# Patient Record
Sex: Male | Born: 1965 | Race: White | Hispanic: No | Marital: Married | Smoking: Former smoker
Health system: Southern US, Community
[De-identification: ages and names within clinical notes are randomized; demographics above are authoritative.]

## PROBLEM LIST (undated history)

## (undated) DIAGNOSIS — D473 Essential (hemorrhagic) thrombocythemia: Secondary | ICD-10-CM

## (undated) DIAGNOSIS — I639 Cerebral infarction, unspecified: Secondary | ICD-10-CM

## (undated) DIAGNOSIS — M1990 Unspecified osteoarthritis, unspecified site: Secondary | ICD-10-CM

## (undated) DIAGNOSIS — Z96649 Presence of unspecified artificial hip joint: Secondary | ICD-10-CM

## (undated) DIAGNOSIS — G4733 Obstructive sleep apnea (adult) (pediatric): Secondary | ICD-10-CM

## (undated) DIAGNOSIS — T7840XA Allergy, unspecified, initial encounter: Secondary | ICD-10-CM

## (undated) DIAGNOSIS — B370 Candidal stomatitis: Secondary | ICD-10-CM

## (undated) DIAGNOSIS — M5410 Radiculopathy, site unspecified: Secondary | ICD-10-CM

## (undated) DIAGNOSIS — D75839 Thrombocytosis, unspecified: Secondary | ICD-10-CM

## (undated) DIAGNOSIS — E8881 Metabolic syndrome: Secondary | ICD-10-CM

## (undated) DIAGNOSIS — E291 Testicular hypofunction: Secondary | ICD-10-CM

## (undated) DIAGNOSIS — K2960 Other gastritis without bleeding: Secondary | ICD-10-CM

## (undated) DIAGNOSIS — J449 Chronic obstructive pulmonary disease, unspecified: Secondary | ICD-10-CM

## (undated) DIAGNOSIS — E669 Obesity, unspecified: Secondary | ICD-10-CM

## (undated) DIAGNOSIS — E785 Hyperlipidemia, unspecified: Secondary | ICD-10-CM

## (undated) DIAGNOSIS — Z96659 Presence of unspecified artificial knee joint: Secondary | ICD-10-CM

## (undated) DIAGNOSIS — N400 Enlarged prostate without lower urinary tract symptoms: Secondary | ICD-10-CM

## (undated) DIAGNOSIS — F1910 Other psychoactive substance abuse, uncomplicated: Secondary | ICD-10-CM

## (undated) DIAGNOSIS — Z96642 Presence of left artificial hip joint: Secondary | ICD-10-CM

## (undated) DIAGNOSIS — I10 Essential (primary) hypertension: Secondary | ICD-10-CM

## (undated) DIAGNOSIS — F429 Obsessive-compulsive disorder, unspecified: Secondary | ICD-10-CM

## (undated) HISTORY — DX: Chronic obstructive pulmonary disease, unspecified: J44.9

## (undated) HISTORY — DX: Allergy, unspecified, initial encounter: T78.40XA

## (undated) HISTORY — DX: Presence of unspecified artificial hip joint: Z96.649

## (undated) HISTORY — DX: Essential (primary) hypertension: I10

## (undated) HISTORY — DX: Candidal stomatitis: B37.0

## (undated) HISTORY — DX: Hyperlipidemia, unspecified: E78.5

## (undated) HISTORY — PX: KNEE SURGERY: SHX244

## (undated) HISTORY — PX: JOINT REPLACEMENT: SHX530

## (undated) HISTORY — DX: Thrombocytosis, unspecified: D75.839

## (undated) HISTORY — DX: Testicular hypofunction: E29.1

## (undated) HISTORY — DX: Other psychoactive substance abuse, uncomplicated: F19.10

## (undated) HISTORY — DX: Radiculopathy, site unspecified: M54.10

## (undated) HISTORY — DX: Obsessive-compulsive disorder, unspecified: F42.9

## (undated) HISTORY — DX: Metabolic syndrome: E88.81

## (undated) HISTORY — DX: Obesity, unspecified: E66.9

## (undated) HISTORY — DX: Other gastritis without bleeding: K29.60

## (undated) HISTORY — DX: Essential (hemorrhagic) thrombocythemia: D47.3

## (undated) HISTORY — DX: Benign prostatic hyperplasia without lower urinary tract symptoms: N40.0

## (undated) HISTORY — DX: Metabolic syndrome: E88.810

## (undated) HISTORY — DX: Presence of unspecified artificial knee joint: Z96.659

## (undated) HISTORY — PX: FACIAL RECONSTRUCTION SURGERY: SHX631

## (undated) HISTORY — DX: Presence of left artificial hip joint: Z96.642

## (undated) HISTORY — DX: Obstructive sleep apnea (adult) (pediatric): G47.33

---

## 2004-03-11 ENCOUNTER — Ambulatory Visit: Payer: Self-pay | Admitting: Otolaryngology

## 2005-02-23 ENCOUNTER — Other Ambulatory Visit: Payer: Self-pay

## 2005-02-23 ENCOUNTER — Emergency Department: Payer: Self-pay | Admitting: Emergency Medicine

## 2005-07-20 ENCOUNTER — Emergency Department: Payer: Self-pay | Admitting: Internal Medicine

## 2006-01-26 ENCOUNTER — Ambulatory Visit: Payer: Self-pay | Admitting: Family Medicine

## 2006-03-04 ENCOUNTER — Ambulatory Visit: Payer: Self-pay | Admitting: Family Medicine

## 2006-06-08 ENCOUNTER — Ambulatory Visit: Payer: Self-pay | Admitting: Family Medicine

## 2008-05-03 ENCOUNTER — Ambulatory Visit: Payer: Self-pay | Admitting: Family Medicine

## 2008-09-06 ENCOUNTER — Ambulatory Visit: Payer: Self-pay

## 2008-10-20 ENCOUNTER — Ambulatory Visit: Payer: Self-pay | Admitting: Internal Medicine

## 2009-02-10 ENCOUNTER — Ambulatory Visit: Payer: Self-pay | Admitting: Family Medicine

## 2009-05-04 ENCOUNTER — Ambulatory Visit: Payer: Self-pay | Admitting: Internal Medicine

## 2009-05-14 ENCOUNTER — Inpatient Hospital Stay: Payer: Self-pay | Admitting: Orthopedic Surgery

## 2009-07-22 ENCOUNTER — Ambulatory Visit: Payer: Self-pay | Admitting: Unknown Physician Specialty

## 2009-07-31 DIAGNOSIS — M069 Rheumatoid arthritis, unspecified: Secondary | ICD-10-CM | POA: Insufficient documentation

## 2009-08-05 ENCOUNTER — Ambulatory Visit: Payer: Self-pay | Admitting: Family Medicine

## 2009-08-15 ENCOUNTER — Ambulatory Visit: Payer: Self-pay | Admitting: Unknown Physician Specialty

## 2010-05-04 ENCOUNTER — Ambulatory Visit: Payer: Self-pay | Admitting: Urology

## 2010-06-08 ENCOUNTER — Ambulatory Visit: Payer: Self-pay | Admitting: Family Medicine

## 2010-10-16 ENCOUNTER — Emergency Department: Payer: Self-pay | Admitting: Emergency Medicine

## 2011-04-08 ENCOUNTER — Ambulatory Visit: Payer: Self-pay | Admitting: Unknown Physician Specialty

## 2011-04-08 LAB — URINALYSIS, COMPLETE
Bacteria: NONE SEEN
Bilirubin,UR: NEGATIVE
Blood: NEGATIVE
Ketone: NEGATIVE
Ph: 7 (ref 4.5–8.0)
Squamous Epithelial: NONE SEEN

## 2011-04-08 LAB — CBC
HCT: 40.8 % (ref 40.0–52.0)
HGB: 13.6 g/dL (ref 13.0–18.0)
MCHC: 33.3 g/dL (ref 32.0–36.0)
Platelet: 393 10*3/uL (ref 150–440)
RBC: 4.46 10*6/uL (ref 4.40–5.90)
WBC: 8.7 10*3/uL (ref 3.8–10.6)

## 2011-04-08 LAB — BASIC METABOLIC PANEL
Anion Gap: 10 (ref 7–16)
BUN: 15 mg/dL (ref 7–18)
Chloride: 105 mmol/L (ref 98–107)
Co2: 29 mmol/L (ref 21–32)
EGFR (Non-African Amer.): >60
Osmolality: 288 (ref 275–301)
Potassium: 3.9 mmol/L (ref 3.5–5.1)
Sodium: 144 mmol/L (ref 136–145)

## 2011-04-15 ENCOUNTER — Ambulatory Visit: Payer: Self-pay | Admitting: Unknown Physician Specialty

## 2011-08-28 ENCOUNTER — Inpatient Hospital Stay: Payer: Self-pay | Admitting: Specialist

## 2011-08-28 LAB — URINALYSIS, COMPLETE
Bacteria: NONE SEEN
Glucose,UR: NEGATIVE mg/dL (ref 0–75)
Ketone: NEGATIVE
Ph: 8 (ref 4.5–8.0)
Protein: NEGATIVE
Specific Gravity: 1.01 (ref 1.003–1.030)
WBC UR: <1 /HPF (ref 0–5)

## 2011-08-28 LAB — COMPREHENSIVE METABOLIC PANEL
Anion Gap: 8 (ref 7–16)
Calcium, Total: 8.5 mg/dL (ref 8.5–10.1)
Chloride: 107 mmol/L (ref 98–107)
Co2: 27 mmol/L (ref 21–32)
EGFR (African American): >60
Osmolality: 283 (ref 275–301)
Potassium: 4.3 mmol/L (ref 3.5–5.1)
SGOT(AST): 20 U/L (ref 15–37)
Sodium: 142 mmol/L (ref 136–145)

## 2011-08-28 LAB — CBC
MCH: 29.9 pg (ref 26.0–34.0)
MCV: 91 fL (ref 80–100)
Platelet: 360 10*3/uL (ref 150–440)
RBC: 4.45 10*6/uL (ref 4.40–5.90)
RDW: 14.6 % — ABNORMAL HIGH (ref 11.5–14.5)
WBC: 12.6 10*3/uL — ABNORMAL HIGH (ref 3.8–10.6)

## 2011-08-29 LAB — BASIC METABOLIC PANEL
Anion Gap: 8 (ref 7–16)
BUN: 16 mg/dL (ref 7–18)
Chloride: 105 mmol/L (ref 98–107)
Co2: 30 mmol/L (ref 21–32)
EGFR (African American): >60
EGFR (Non-African Amer.): >60
Glucose: 100 mg/dL — ABNORMAL HIGH (ref 65–99)
Osmolality: 286 (ref 275–301)
Potassium: 4.2 mmol/L (ref 3.5–5.1)
Sodium: 143 mmol/L (ref 136–145)

## 2011-08-29 LAB — LIPID PANEL
HDL Cholesterol: 18 mg/dL — ABNORMAL LOW (ref 40–60)
Triglycerides: 277 mg/dL — ABNORMAL HIGH (ref 0–200)

## 2011-08-29 LAB — CBC WITH DIFFERENTIAL/PLATELET
Basophil %: 1.2 %
Eosinophil #: 0.2 10*3/uL (ref 0.0–0.7)
Eosinophil %: 1.8 %
HCT: 42.2 % (ref 40.0–52.0)
HGB: 13.3 g/dL (ref 13.0–18.0)
Lymphocyte #: 3.9 10*3/uL — ABNORMAL HIGH (ref 1.0–3.6)
MCH: 29.2 pg (ref 26.0–34.0)
MCHC: 31.6 g/dL — ABNORMAL LOW (ref 32.0–36.0)
MCV: 92 fL (ref 80–100)
Monocyte #: 0.6 x10 3/mm (ref 0.2–1.0)
Neutrophil #: 6 10*3/uL (ref 1.4–6.5)
RBC: 4.57 10*6/uL (ref 4.40–5.90)

## 2011-08-29 LAB — HEMOGLOBIN A1C: Hemoglobin A1C: 6.4 % — ABNORMAL HIGH (ref 4.2–6.3)

## 2011-09-20 DIAGNOSIS — Z8673 Personal history of transient ischemic attack (TIA), and cerebral infarction without residual deficits: Secondary | ICD-10-CM | POA: Insufficient documentation

## 2012-02-18 ENCOUNTER — Ambulatory Visit: Payer: Self-pay | Admitting: Emergency Medicine

## 2012-02-18 LAB — RAPID INFLUENZA A&B ANTIGENS

## 2012-07-20 ENCOUNTER — Ambulatory Visit: Payer: Self-pay | Admitting: Unknown Physician Specialty

## 2012-07-21 LAB — HM COLONOSCOPY: HM Colonoscopy: ABNORMAL

## 2012-10-05 ENCOUNTER — Inpatient Hospital Stay: Payer: Self-pay | Admitting: Psychiatry

## 2012-10-05 LAB — COMPREHENSIVE METABOLIC PANEL
Albumin: 4 g/dL (ref 3.4–5.0)
Alkaline Phosphatase: 81 U/L (ref 50–136)
BUN: 10 mg/dL (ref 7–18)
Chloride: 106 mmol/L (ref 98–107)
Creatinine: 0.94 mg/dL (ref 0.60–1.30)
EGFR (African American): >60
EGFR (Non-African Amer.): >60
Glucose: 92 mg/dL (ref 65–99)
Osmolality: 274 (ref 275–301)
SGPT (ALT): 27 U/L (ref 12–78)

## 2012-10-05 LAB — URINALYSIS, COMPLETE
Bacteria: NONE SEEN
Bilirubin,UR: NEGATIVE
Blood: NEGATIVE
Ketone: NEGATIVE
Nitrite: NEGATIVE
Protein: NEGATIVE
RBC,UR: 3 /HPF (ref 0–5)
Specific Gravity: 1.02 (ref 1.003–1.030)
Squamous Epithelial: <1
WBC UR: 4 /HPF (ref 0–5)

## 2012-10-05 LAB — DRUG SCREEN, URINE
Amphetamines, Ur Screen: NEGATIVE (ref ?–1000)
Barbiturates, Ur Screen: NEGATIVE (ref ?–200)
Cannabinoid 50 Ng, Ur ~~LOC~~: POSITIVE (ref ?–50)
MDMA (Ecstasy)Ur Screen: NEGATIVE (ref ?–500)
Opiate, Ur Screen: POSITIVE (ref ?–300)

## 2012-10-05 LAB — SALICYLATE LEVEL: Salicylates, Serum: 3.3 mg/dL — ABNORMAL HIGH

## 2012-10-05 LAB — TSH: Thyroid Stimulating Horm: 5.5 u[IU]/mL — ABNORMAL HIGH

## 2012-10-05 LAB — CBC
Platelet: 427 10*3/uL (ref 150–440)
RDW: 17.9 % — ABNORMAL HIGH (ref 11.5–14.5)

## 2012-10-05 LAB — ETHANOL: Ethanol %: <0.003 % (ref 0.000–0.080)

## 2012-10-23 ENCOUNTER — Emergency Department: Payer: Self-pay | Admitting: Emergency Medicine

## 2012-10-23 LAB — DRUG SCREEN, URINE
Amphetamines, Ur Screen: NEGATIVE (ref ?–1000)
Benzodiazepine, Ur Scrn: POSITIVE (ref ?–200)
Cannabinoid 50 Ng, Ur ~~LOC~~: POSITIVE (ref ?–50)
Opiate, Ur Screen: NEGATIVE (ref ?–300)
Phencyclidine (PCP) Ur S: NEGATIVE (ref ?–25)

## 2012-10-23 LAB — COMPREHENSIVE METABOLIC PANEL WITH GFR
Albumin: 3.7 g/dL
Alkaline Phosphatase: 75 U/L
Anion Gap: 6 — ABNORMAL LOW
BUN: 20 mg/dL — ABNORMAL HIGH
Bilirubin,Total: 0.5 mg/dL
Calcium, Total: 8.9 mg/dL
Chloride: 103 mmol/L
Co2: 26 mmol/L
Creatinine: 1.05 mg/dL
EGFR (African American): >60
EGFR (Non-African Amer.): >60
Glucose: 115 mg/dL — ABNORMAL HIGH
Osmolality: 274
Potassium: 4 mmol/L
SGOT(AST): 20 U/L
SGPT (ALT): 30 U/L
Sodium: 135 mmol/L — ABNORMAL LOW
Total Protein: 7.3 g/dL

## 2012-10-23 LAB — URINALYSIS, COMPLETE
Blood: NEGATIVE
Glucose,UR: NEGATIVE mg/dL (ref 0–75)
Ketone: NEGATIVE
Leukocyte Esterase: NEGATIVE
Nitrite: NEGATIVE
Ph: 6 (ref 4.5–8.0)
Protein: NEGATIVE
RBC,UR: 1 /HPF (ref 0–5)
Squamous Epithelial: <1
WBC UR: 1 /HPF (ref 0–5)

## 2012-10-23 LAB — CBC
HCT: 39.6 % — ABNORMAL LOW (ref 40.0–52.0)
HGB: 13 g/dL (ref 13.0–18.0)
MCHC: 32.9 g/dL (ref 32.0–36.0)
MCV: 84 fL (ref 80–100)
Platelet: 510 10*3/uL — ABNORMAL HIGH (ref 150–440)
RBC: 4.72 10*6/uL (ref 4.40–5.90)
WBC: 14.3 10*3/uL — ABNORMAL HIGH (ref 3.8–10.6)

## 2012-10-23 LAB — ETHANOL
Ethanol %: <0.003 % (ref 0.000–0.080)
Ethanol: <3 mg/dL

## 2012-10-23 LAB — TSH: Thyroid Stimulating Horm: 3.71 u[IU]/mL

## 2012-11-10 ENCOUNTER — Emergency Department: Payer: Self-pay | Admitting: Emergency Medicine

## 2013-07-27 DIAGNOSIS — I639 Cerebral infarction, unspecified: Secondary | ICD-10-CM | POA: Insufficient documentation

## 2013-09-06 ENCOUNTER — Ambulatory Visit: Payer: Self-pay | Admitting: Family Medicine

## 2013-09-06 LAB — PSA: PSA: NORMAL

## 2013-11-12 ENCOUNTER — Ambulatory Visit: Payer: Self-pay | Admitting: Family Medicine

## 2014-06-04 NOTE — Discharge Summary (Signed)
PATIENT NAME:  Tim Anderson, Tim Anderson MR#:  [id number] DATE OF BIRTH:  12/16/1965  DATE OF ADMISSION:  08/28/2011 DATE OF DISCHARGE:  08/29/2011  HISTORY AND PHYSICAL: For a detailed note, please take a look at the History and Physical done on admission by Dr. Karsten Fells.   DIAGNOSES AT DISCHARGE:  1. Slurred speech/suspected cerebrovascular accident but now ruled out.  2. History of rheumatoid arthritis.  3. Hypertension.  4. Gastroesophageal reflux disease.   5. Benign prostatic hypertrophy.  6. Gastroesophageal reflux disease.  7. Obstructive sleep apnea.   DIET: The patient was discharged on a low-sodium diet.   ACTIVITY: As tolerated.   FOLLOW UP: Follow up with Dr. Etter Sjogren in the next 1 to 2 weeks.    DISCHARGE MEDICATIONS:  1. Tylenol with oxycodone 5/325, 1 to 2 tabs every 4 hours as needed.  2. Aspirin 81 mg daily.  3. Flomax 0.4 mg daily.  4. Lisinopril 20 mg daily.  5. Celebrex 200 mg daily.  6. Ibuprofen 200 mg b.i.d. as needed. 7. Methotrexate 2.5 mg weekly on 2 Tuesdays.  8. Pristiq 50 mg at bedtime.  9. Folic acid 1 mg daily.   PERTINENT STUDIES DURING HOSPITAL COURSE:  CT scan of the head done without contrast on admission showing no acute intracranial abnormalities.  Carotid ultrasound done also showing no hemodynamically significant carotid stenosis with antegrade flow in both vertebral arteries.  A two-dimensional echocardiogram showing normal chamber size, normal LV function, with no evidence of any thrombus and a trace mitral regurgitation.   HOSPITAL COURSE: This is a 49 year old male with medical problems as mentioned above, presented to the hospital with some slurred/dysarthric speech and suspected cerebrovascular accident.   1. Suspected cerebrovascular accident: The patient was admitted to the hospital for a possible stroke given his slurred speech. He underwent a CT scan of his head on admission which was negative. He was observed overnight and had a  carotid duplex and an echocardiogram which was essentially normal. He was supposed to get an MRI although he could not get it as he has a metal plate in his body. Shortly after admission, his clinical symptoms had resolved. He had no further slurred speech or any other neurological symptoms. He was therefore discharged back on his low-dose aspirin as stated.  2. Benign prostatic hypertrophy: The patient had no evidence of urinary obstruction. He was maintained on his Flomax. He will resume that.   3. Hypertension: He remained hemodynamically stable on his lisinopril, which he will continue.  4. Rheumatoid arthritis: He will continue his Celebrex and methotrexate weekly.  5. Anxiety: The patient was maintained on some Xanax while in the hospital but did not require any antianxiety medications.   6. Gastroesophageal reflux disease: The patient was maintained on some Prilosec, and he will resume that upon discharge too.   CODE STATUS: The patient is a FULL CODE.   TIME SPENT WITH DISCHARGE: 40 minutes.   ____________________________ Belia Heman. Verdell Carmine, MD vjs:cbb D: 08/31/2011 13:32:09 ET T: 09/01/2011 10:36:31 ET JOB#: [id number]  cc: Belia Heman. Verdell Carmine, MD, <Dictator> Bethena Roys. Ancil Boozer, MD Henreitta Leber MD ELECTRONICALLY SIGNED 09/01/2011 14:03

## 2014-06-07 NOTE — Discharge Summary (Signed)
PATIENT NAME:  Tim Anderson, Tim Anderson MR#:  [id number] DATE OF BIRTH:  1965/04/21  DATE OF ADMISSION:  10/05/2012 DATE OF DISCHARGE:  10/09/2012  HOSPITAL COURSE:  See dictated history and physical for details of admission.  This 49 year old man came into the hospital after having expressed some suicidal ideation and even going so far as to take out a shotgun at his home.  He had relapsed on the cocaine and was feeling desperate and overwhelmed.  In the hospital, the patient was treated with a combination of medication as well as individual and group psychotherapy.  He did not engage in any suicidal behavior and was cooperative with treatment.  He denied any suicidal ideation and was appropriate and showed good insight.  The patient understood that his behavior was out of control.  He and his wife had made arrangements for him to go to a longer-term inpatient program, but it will be a few weeks until then.  We tried to encourage the patient to go to the alcohol and drug abuse treatment center in the meantime, but he preferred to go home.  We consulted with his wife who did not feel like he was in acute danger and agreed with this plan.  He was going to be spending time with his minister and staying away from people, places and things where he had used drugs.  His mood was upbeat and his affect was good.  He had multiple things in his life he was positive about and looking forward to.  The patient was counseled about the importance of staying sober and of staying in contact with his loved ones and advised that if any kind of desperate suicidal ideation returned he should get help him immediately, all of which he agreed to.   DISCHARGE MEDICATIONS:  Pristiq 50 mg every other day, methotrexate 2.5 mg once a week 8 tablets at that time, Cialis 5 mg once a day as needed, testosterone 100 mg/mL 1.2 mL intramuscular injection every two weeks, Allegra 180 mg once a day, Maxalt as needed, lisinopril 40 mg 2 times a day,  Flomax 0.4 mg once a day, atorvastatin 10 mg once a day, BuSpar 10 mg twice a day, hydroxyzine every six hours as needed for anxiety, Carvedilol 25 mg 2 times a day, Albuterol as needed inhalation, tiotropium 18 mcg inhaled once a day, omeprazole 20 mg once a day.   LABORATORY RESULTS:  Admission labs included a drug screen positive for cannabis, opiates, cocaine.  TSH was slightly elevated at 5.5.  Alcohol undetected.  Chemistry panel otherwise unremarkable.  CBC, elevated white count at 18.3, probably related to his methotrexate use.  Urinalysis unremarkable.  Acetaminophen normal.  Salicylates only slightly up at 3.3.   MENTAL STATUS EXAMINATION AT DISCHARGE:  Neatly dressed and groomed man, looks his stated age, cooperative with the interview.  Improved insight and judgment.  Thoughts lucid.  No evidence of loosening of associations or delusions.  He totally denies any suicidal or homicidal ideation and is able to articulate multiple positive things in his life he wants to live for especially his family and children.  The patient agreed to outpatient treatment in the community.  No sign of psychosis.  Intelligence normal.   DIAGNOSIS, PRINCIPAL AND PRIMARY:  AXIS I:  Substance-induced mood disorder, depressed.   SECONDARY DIAGNOSES: AXIS I:  Cocaine dependence, marijuana abuse, obsessive-compulsive disorder by history.  AXIS II:  Deferred.  AXIS III:  Rheumatoid arthritis, gastric reflux, high blood pressure, chronic obstructive  pulmonary disease.  AXIS IV:  Severe from the stress of his substance abuse and break-up with his wife.  AXIS V:  Functioning at time of discharge 53.    ____________________________ Gonzella Lex, MD jtc:ea D: 10/20/2012 23:30:18 ET T: 10/21/2012 03:41:09 ET JOB#: [id number]  cc: Gonzella Lex, MD, <Dictator> Gonzella Lex MD ELECTRONICALLY SIGNED 10/23/2012 17:16

## 2014-06-07 NOTE — Consult Note (Signed)
Brief Consult Note: Diagnosis: Polysubstance dependence.   Patient was seen by consultant.   Consult note dictated.   Recommend further assessment or treatment.   Orders entered.   Comments: Mr. Buday has a h/o substance abuse. he was just discharged from [facility] ans is awaiting long term treatment. He was brought to ER agitated, threatening, paranoid.   PLAN: 1. The patient is on IVC.  2. He was referred to [place] rehab facility.  3. I will start risperdal for agitation/mood stabilization.  Electronic Signatures: Orson Slick (MD)  (Signed 08-Sep-14 17:29)  Authored: Brief Consult Note   Last Updated: 08-Sep-14 17:29 by Orson Slick (MD)

## 2014-06-07 NOTE — Consult Note (Signed)
Brief Consult Note: Diagnosis: Polysubstance dependence.   Patient was seen by consultant.   Consult note dictated.   Recommend further assessment or treatment.   Orders entered.   Comments: Pt seen in ED. He was minimizing his drug use and stated that he only MJ couple of days ago and was arrested as he was having conflict with the boys who were making noise at the convenience store next to his home. He has called 911 twice on them. he stated that he has been taking his medications and wants to be discharged as his grandson will be born tomorrow. His wife is calling and she is concerned about his behavior. She wants him to get help as he has been threatening and was unable to control himself.   PLAN: 1. The patient is on IVC.  2. He was referred to [facility] facility and will be transferred tomorrow.   3. Continue risperdal for agitation/mood stabilization.  Electronic Signatures: Jeronimo Norma (MD)  (Signed 09-Sep-14 10:46)  Authored: Brief Consult Note   Last Updated: 09-Sep-14 10:46 by Jeronimo Norma (MD)

## 2014-06-07 NOTE — Consult Note (Signed)
Brief Consult Note: Diagnosis: Polysubstance dependence. Mood DO.   Patient was seen by consultant.   Consult note dictated.   Recommend further assessment or treatment.   Orders entered.   Comments: Pt seen in ED. He stated that he is doing better today and is willing to go to [facility] as he has never been in the rehab program. He is taking the medications and no acute w/d symptoms noted. Stated that he was unable to sleep last night, due to Rheumatoid Arthritis and he was taking Methotrexate and Folic Acid to help with the same. He was noted to be limping this am. He does not know the reason for flare up. He currently denied SI/HI or plans. He is now accepting his drugs use and rehab.   PLAN: 1. The patient is on IVC.  2. He is going to  [facility] facility and will be transferred shortly.   3. Continue risperdal for agitation/mood stabilization.  Electronic Signatures: Jeronimo Norma (MD)  (Signed 11-Sep-14 09:42)  Authored: Brief Consult Note   Last Updated: 11-Sep-14 09:42 by Jeronimo Norma (MD)

## 2014-06-07 NOTE — H&P (Signed)
PATIENT NAME:  Tim Anderson, Tim Anderson MR#:  [id number] DATE OF BIRTH:  [birth year]  DATE OF ADMISSION:  10/05/2012  IDENTIFYING INFORMATION AND CHIEF COMPLAINT: A 49 year old man with a history of cocaine dependence who came to the hospital voluntarily requesting admission because of relapse into cocaine and suicide attempt.   HISTORY OF PRESENT ILLNESS: Information obtained from the patient and the chart. The patient reports that he has relapsed into cocaine use. He is using mostly powder cocaine and has been doing hundreds of dollars a day. He relapsed several months ago and has been escalating his use. His wife found out about it and served him with separation papers. He has continued to use. Yesterday he felt so desperate that he sat on a sofa with a loaded gun next to him, thinking about killing himself. It was at this point that he came to the hospital. Recent mood has been down and depressed and negative about himself. Sleep intermittent. Chronic pain from rheumatoid arthritis. Does not report regular suicidal ideation, but had thoughts of shooting himself yesterday. Denies homicidal ideation. Denies hallucinations or psychotic symptoms. He says that he has been diagnosed with obsessive-compulsive disorder and is treated with Pristiq and has been compliant with his medicine. Denies that he is using alcohol. Does admit that he uses marijuana fairly regularly.   PAST PSYCHIATRIC HISTORY: First started using cocaine about 20 years ago. He has been able to go for extended periods of time of years without using. He had had a long period of sobriety before his recent relapse. He has been to inpatient treatment at the [facility] in the past. It does not sound like he goes to much regular outpatient treatment. He had no other inpatient psychiatric hospitalization. No history of prior suicide attempts or violence. He is currently taking Pristiq 50 mg a day for OCD. He cannot recall any other  medications he has taken.   PAST MEDICAL HISTORY: The patient has rheumatoid arthritis diagnosed several years ago. 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 He has high blood pressure, chronic allergies, gastric reflux, prostate hypertrophy and COPD as well.   SOCIAL HISTORY: The patient is married to his third wife. No children with her, but together they have several adult children. 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 He has not been able to work for a while himself because of his arthritis, but he still manages the business. He has adult children, as mentioned previously. He is from this area and says that he knows a lot of people who live around here, which has been a trigger for his use at times.   FAMILY HISTORY: He has a sister who also has a substance abuse problem and mental health problems. He has a cousin who committed suicide by gunshot.   CURRENT MEDICATIONS: Correct medication reconciliation currently seems to be Spiriva HandiHaler 1 capsule per day, 81 mg aspirin per day, Flomax 0.4 mg per day, Prilosec 20 mg per day, Pristiq 50 mg per day, Zestril 40 mg b.i.d., Allegra 180 mg per day, Coreg 25 mg twice a day, atorvastatin 10 mg at night.   ALLERGIES: CIPROFLOXACIN AND DROPERIDOL.   REVIEW OF SYSTEMS: Depressed mood, anxiety, intermittent sleep, negative thoughts about himself. Had suicidal thoughts yesterday but resisted it. Denies suicidal ideation today. No homicidal ideation. Denies psychotic symptoms. He does have chronic pain,  mostly in his knees and wrists and fingers.   MENTAL STATUS EXAMINATION: This is a well groomed, casually dressed man who looks his stated age. Cooperative and pleasant with the interview. Good eye contact. Normal psychomotor activity. Speech normal rate, tone and volume. Affect anxious, a little fidgety. Mood  stated as being nervous. Thoughts are lucid overall without loosening of associations or delusions. Denies auditory or visual hallucinations. Denies any current suicidal or homicidal ideation. Judgment and insight possibly somewhat impaired by his substance abuse. Intelligence normal. Alert and oriented x 4.   PHYSICAL EXAMINATION: GENERAL: The patient does not appear to be in any acute distress. He is deeply tanned but has no skin lesions identified.  HEENT: Pupils equal and reactive. Face symmetric. Oral mucosa normal.  NEUROMUSCULAR: Neck and back nontender. His right knee is slightly swollen, as are all of his finger knuckle joints. He had some decreased range of motion at these joints. Normal gait. Strength and reflexes normal throughout. Cranial nerves symmetric and normal.  LUNGS: Clear without wheezes.  HEART: Regular rate and rhythm.  ABDOMEN: Soft, nontender, normal bowel sounds.  VITAL SIGNS: Most recent temperature 98.1, pulse 69, respirations 20, blood pressure 143/90.   LABORATORY RESULTS: Chemistries normal. CBC had an elevated white count at 18.3, probably due to the fact that he just stopped taking prednisone. TSH elevated at 5.5. Drug screen positive for cocaine, opiates and cannabis. Urinalysis unremarkable.   ASSESSMENT: A 49 year old man with cocaine dependence. Also history of OCD. Yesterday got out a gun and seriously considered shooting himself. He has continued to use cocaine in an out-of-control way and has very strong negative feelings with a lot of psychosocial stresses around it. I think the patient does have a significant risk for suicide based on all of these factors. Although he is reporting that he is feeling better today and requesting discharge, I think it is better that he stay in the hospital while we consider possibly some other kind of longer-term treatment. He has arrangements made to go into a long-term program, but it does not start for another 6 weeks.    TREATMENT PLAN: Admit the patient to psychiatry. Continue current medicines. Also continue tramadol p.r.n. for his pain. Methotrexate 20 mg a week as usual will be given. Psychoeducation and supportive therapy done. Family should be involved if possible. I think it would be best if we could possibly refer him to inpatient rehab treatment directly, and I would like to work on trying to encourage that.   DIAGNOSIS, PRINCIPAL AND PRIMARY:  AXIS I: Substance-induced mood disorder.   SECONDARY DIAGNOSES: AXIS I:  1.  Cocaine dependence.  2.  Marijuana abuse.  3.  Obsessive-compulsive disorder by history.  AXIS II: Deferred.  AXIS III: Rheumatoid arthritis, gastric reflux, high blood pressure, chronic obstructive pulmonary disease.  AXIS IV: Severe from the stress of his substance abuse and the break-up with his wife.  AXIS V: Functioning at time of evaluation is 4.    ____________________________ Gonzella Lex, MD jtc:jm D: 10/06/2012 16:24:46 ET T: 10/06/2012 17:19:28 ET JOB#: [id number]  cc: Gonzella Lex, MD, <Dictator> Gonzella Lex MD ELECTRONICALLY SIGNED 10/09/2012 9:25

## 2014-06-07 NOTE — Consult Note (Signed)
PATIENT NAME:  Tim Anderson, Tim Anderson MR#:  [id number] DATE OF BIRTH:  03-Sep-1965  DATE OF CONSULTATION:  10/23/2012  REFERRING PHYSICIAN:   CONSULTING PHYSICIAN:  Gisselle Galvis B. Bary Leriche, MD  REFERRING PHYSICIAN: Lisa Roca, MD  REASON FOR CONSULTATION: To evaluate homicidal patient.   IDENTIFYING DATA: Tim Anderson is a 49 year old male with history of substance abuse.   CHIEF COMPLAINT: "I need to go home."   HISTORY OF PRESENT ILLNESS: Tim Anderson was hospitalized at [facility] just a few days ago for substance abuse. The patient relapsed on cocaine and possibly attempted suicide. This was in the context of marital separation.  The patient was discharged to home with his wife with a plan to enter a site-based substance abuse treatment program a few weeks from now. The patient postponed this treatment as he is awaiting the birth of his grandchild and would not want to miss it. He was offered [facility] at this point, but declined. Following discharge apparently, the patient relapsed on substances very quickly. In addition to substance use, his behavior became strange. He started calling police he was threatening some men convenience store, accusing them of beating up his cousin, which probably is true.  He ended up threatening reportedly with a gun. The patient minimizes the danger. He is adamant that the men were no good enough to something, so he was trying to make a police aware of the situation and help him out. He does admit to using cocaine. In addition to cocaine on admission, he was positive for benzodiazepines and cannabinoids. His wife wants a divorce and the patient will not be able to return home following his substance abuse treatment. At the moment even though separated, they reside at the same house.   PAST PSYCHIATRIC HISTORY: There is a long history of cocaine abuse, but recently and cleaned up until separation. He had one suicide attempt for which he was  hospitalized in August of this year. There is a history of substance abuse treatment at [facility]. The patient has a history of OCD for which he treated with Pristiq.   FAMILY PSYCHIATRIC HISTORY: None.   PAST MEDICAL HISTORY: Rheumatoid arthritis.   ALLERGIES: CIPRO,  DROPERIDOL.   MEDICATIONS ON ADMISSION: Pristiq 50 mg, Prilosec 20 mg, Restoril 40 mg twice daily, Allegra 180 mg daily, Coreg 25 mg twice daily, atorvastatin 10 mg daily, Flomax 0.4 mg daily, Spiriva twice daily.   SOCIAL HISTORY: This is his third marriage dissipating. There are children from other marriages. 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   REVIEW OF SYSTEMS:  CONSTITUTIONAL: No fevers or chills. No weight changes.  EYES: No double or blurred vision.  ENT: No hearing loss.  RESPIRATORY: No shortness of breath or cough.  CARDIOVASCULAR: No chest pain or orthopnea.  GASTROINTESTINAL: No abdominal pain, nausea, vomiting or diarrhea.  GENITOURINARY: No incontinence or frequency.  ENDOCRINE: No heat or cold intolerance.  LYMPHATIC: No anemia or easy bruising.  INTEGUMENTARY: No acne or rash.  MUSCULOSKELETAL: Positive for joint pain.  NEUROLOGIC: No tingling or weakness.  PSYCHIATRIC: See history of present illness for details.   PHYSICAL EXAMINATION: VITAL SIGNS: Blood pressure 142/67, pulse 91, respirations 18, temperature 98.7.  GENERAL: This is a well-developed male in no acute distress. The rest of the physical examination is deferred to his primary attending.   LABORATORY DATA:  Chemistries are within normal limits except for sodium of 135 and BUN of 20. Blood alcohol level is zero. LFTs within normal limits. TSH 3.71. Urine tox screen positive for benzodiazepine, cocaine and cannabinoids. CBC within normal limits except for white blood count of 14.3 and high platelets of 510.  Urinalysis is not suggestive of urinary tract infection.   MENTAL STATUS EXAMINATION: The patient is alert and oriented to person, place, time and somewhat to situation. He insists that his actions were rational in calling police a million times was completely justified.  He is pleasant, polite and cooperative. He is wearing hospital scrubs. He is deeply tanned.  He maintains good eye contact. His speech is pressured and loud at times. Mood is fine with exuberant affect. Thought process is logical and goal oriented but there are racing thoughts. Thought content: He denies suicidal or homicidal ideation, but threatened someone with a gun prior to admission. He is paranoid. He denies hallucinations. His cognition is grossly intact but difficult to assess. His insight and judgment are dismal.  DIAGNOSES: AXIS I: Mood disorder, not otherwise specified, cocaine abuse, benzodiazepine abuse,  marijuana abuse, obsessive-compulsive disorder, by history.   AXIS II: Deferred.   AXIS III: Rheumatoid arthritis, gastroesophageal reflux disease, hypertension, chronic obstructive pulmonary disease, dyslipidemia.   AXIS IV: Mental illness, substance abuse, marital.   AXIS V: Global assessment of functioning 35.   PLAN:  1.  The patient is on IVC.  2.  He was referred to [facility] substance abuse treatment facility and will be transferred as soon as a bed is available.  3.  We will restart him on Risperdal for mood stabilization and for sleep. I will probably add another mood stabilizer like Tegretol.  4.  Psychiatry will follow along.  ____________________________ Wardell Honour. Bary Leriche, MD jbp:cc D: 10/23/2012 19:58:43 ET T: 10/23/2012 20:35:56 ET JOB#: [id number]  cc: Arlind Klingerman B. Bary Leriche, MD, <Dictator> Clovis Fredrickson MD ELECTRONICALLY SIGNED 10/24/2012 6:30

## 2014-06-09 NOTE — H&P (Signed)
PATIENT NAME:  Tim Anderson, Tim Anderson MR#:  [id number] DATE OF BIRTH:  1965/09/13  DATE OF ADMISSION:  08/28/2011  REFERRING PHYSICIAN: ER physician, Dr. Corky Downs   PRIMARY CARE PHYSICIAN: Dr. Ancil Boozer   CHIEF COMPLAINT: Slurred speech.   HISTORY OF PRESENT ILLNESS: The patient is a 49 year old male with past medical history of hypertension, rheumatoid arthritis, migraines, sleep apnea on CPAP who was in his usual state of health until this afternoon while working on a deck he had a headache, felt dizzy and went aphasic. The patient reports that his mouth and tongue felt swollen and he felt that he was not able to speak and get his words out. Subsequently the patient fainted for about one minute and when he recovered he was having dysarthria and was unable to express himself. This happened around 11 p.m. today. The patient denies having similar episodes in the past. He did not eat any breakfast today. He reports that he normally does not eat any breakfast. The patient reports that he took some Toradol and Maxalt for migraine headache around 2 p.m. yesterday and at bedtime took half a tablet of 0.25 mg Xanax, half tablet of 5 mg oxycodone, and some Phenergan to help him sleep. Normally he takes oxycodone and Xanax as needed at bedtime to help him sleep but, however, last night he was not able to sleep, therefore he took Phenergan also. As per the patient's wife who is a Software engineer, the patient has also recently started taking some multivitamins to help with his rheumatoid arthritis and his low testosterone which he was diagnosed with recently.   ALLERGIES: Cipro and Droperidol.   PAST MEDICAL HISTORY:  1. History of kidney stones.  2. Benign prostatic hypertrophy.  3. Hypertension. 4. Rheumatoid arthritis.   5. Low testosterone. 6. Migraines. 7. Sleep apnea, on CPAP.   PAST SURGICAL HISTORY:  1. Right knee arthroscopy. 2. Right wrist septic arthritis. 3. Colonoscopy. 4. Facial  reconstruction. 5. Kidney stone removal   MEDICATIONS: 1. Lisinopril 40 mg daily.  2. Allegra 180 mg daily.  3. Pristiq 50 mg daily.  4. Flomax 0.4 mg daily.  5. Celebrex 200 mg daily. 6. Folic acid 1 mg daily.  7. Aspirin 81 mg daily.  8. Methotrexate 20 mg on Tuesdays.  9. The patient also takes p.r.n. Phenergan, Xanax, oxycodone, Toradol, and Maxalt. He does not remember the doses.   SOCIAL HISTORY: He smokes 1 to 1-1/2 packs per day. Denies any alcohol or drug abuse. He is married. Lives with his wife who is a Software engineer. He works in [employer].   FAMILY HISTORY: Mother has hypertension, osteoarthritis, diabetes. Father died at age of 37 of MI. He was diabetic. Brother died at age of 76 with MI.   REVIEW OF SYSTEMS: CONSTITUTIONAL: Denies any fever. Reports fatigue, weakness. EYES: Denies any blurred or double vision. ENT: Denies any tinnitus, ear pain. RESPIRATORY: Denies any cough, wheezing. CARDIOVASCULAR: Denies any chest pain, palpitations. GI: Denies any nausea, vomiting, diarrhea, abdominal pain. GU: Denies any dysuria or hematuria. ENDOCRINE: Denies any polyuria or nocturia. HEME/LYMPH: Denies any anemia or easy bruisability. INTEGUMENTARY: Denies any acne or rash. MUSCULOSKELETAL: Denies any swelling, gout. NEUROLOGICAL: Reports dysarthria and reports migraines. PSYCH: Has some anxiety and insomnia.   PHYSICAL EXAMINATION:   VITAL SIGNS: Temperature 98.2, heart rate 81, respiratory rate 20, blood pressure 145/79, pulse oximetry 96% on room air.   GENERAL: The patient is a 49 year old male lying comfortably in bed not in acute distress.   HEAD:  Atraumatic, normocephalic.   EYES: There is no pallor, icterus, or cyanosis. Pupils equal, round, and reactive to light and accommodation. Extraocular movements intact.   ENT: Wet mucous membranes. No oropharyngeal erythema or thrush.   NECK: Supple. No masses. No JVD. No thyromegaly. No lymphadenopathy.  CHEST: Chest wall  nontender. No tenderness to palpation. Not using accessory respiratory muscles for respiration. No intercostal retraction.   LUNGS: Bilaterally clear to auscultation. No wheezing, rales, or rhonchi.   CARDIOVASCULAR: S1, S2 regular. No murmur, rubs, or gallops.   ABDOMEN: Soft, nontender, nondistended. No guarding. No rigidity. No organomegaly.   SKIN: No rashes or lesions.   PERIPHERIES: 2+ pedal pulses. No pedal edema.   MUSCULOSKELETAL: No cyanosis or clubbing.   NEUROLOGIC: Awake, alert, oriented x3. Nonfocal neurological exam. Cranial nerves grossly intact. Motor strength 5 out of 5 in all extremities.   PSYCH: Normal mood and affect.   LABORATORY, DIAGNOSTIC, AND RADIOLOGICAL DATA: Chest x-ray showed no acute abnormalities.   CT head showed no acute abnormalities.   White count 12.6, hemoglobin 13.3, hematocrit 40.3, platelet count 360, glucose 111, BUN 11, creatinine 1.02, sodium 142, potassium 4.3, chloride 107, CO2 27, calcium 8.5.   ASSESSMENT AND PLAN: This is a 49 year old male with history of hypertension, rheumatoid arthritis, and benign prostatic hypertrophy who presents with dysarthria.   1. Dysarthria. The patient denies having similar episode in the past. His initial CAT scan did not show any abnormalities. Will admit the patient to a telemetry bed and obtain an MRI of the brain to rule out acute CVA. The patient has also been taking over-the-counter multivitamins to help with his rheumatoid arthritis and low testosterone. In addition, last night he took Phenergan, Xanax, and oxycodone which could have contributed to his symptoms. Will also check an echo and carotid ultrasound. Will start him on a full dose aspirin. Obtain a speech therapy consultation.  Will check a fasting lipid profile. 2. Leukocytosis, possibly reactive. The patient is afebrile with no other symptoms of infection. Will monitor.  3. Hyperglycemia, possibly reactive. Will check a hemoglobin  A1c. 4. History of smoking. The patient was counseled about cessation for more than three minutes. Will provide with a nicotine patch.  5. History of hypertension. Will continue lisinopril.  6. History of benign prostatic hypertrophy. Will continue Flomax.  7. History of rheumatoid arthritis. Will continue folic acid and Celebrex. 8. History of sleep apnea. Will continue CPAP machine at bedtime.   Reviewed all medical records, discussed with the ED physician, discussed with the patient and his wife the plan of care and management.   TIME SPENT: 75 minutes.   ____________________________ Cherre Huger, MD sp:drc D: 08/28/2011 16:45:55 ET T: 08/29/2011 07:58:12 ET JOB#: [id number]  cc: Cherre Huger, MD, <Dictator> Bethena Roys. Ancil Boozer, MD Cherre Huger MD ELECTRONICALLY SIGNED 08/29/2011 12:40

## 2014-06-09 NOTE — Op Note (Signed)
PATIENT NAME:  Tim Anderson, Tim Anderson MR#:  [id number] DATE OF BIRTH:  [birth year]  DATE OF PROCEDURE:  04/15/2011  PREOPERATIVE DIAGNOSIS: Internal derangement of right knee with degenerative arthritis.   POSTOPERATIVE DIAGNOSES:  1. Complex tear, degenerative in nature, of medial meniscus with an acute component.  2. Grade IV chondral changes, medial tibial plateau and medial distal femur.  3. Small degenerative tear, lateral meniscus.   PROCEDURE PERFORMED: Diagnostic and operative arthroscopy with partial medial and lateral meniscectomy.   SURGEON: Aaidyn San. Yared Susan, M.D.   ASSISTANT: None.   ANESTHESIA: General.   ESTIMATED BLOOD LOSS: Negligible.   COMPLICATIONS: None.   BRIEF CLINICAL NOTE AND PATHOLOGY: The patient had persistent knee pain with mechanical symptoms, documented degenerative arthritis. Options, risks, and benefits were discussed and it was felt that debridement of the knee and examination was warranted to try to relieve some of his mechanical symptoms. At the time of the procedure, there was a very significant posterior medial meniscus tear compatible with a degenerative type tear of with an acute component. There was a large area of approximately 25 and 30% of the medial femoral condyle with grade IV chondral changes. There was a small corresponding area on the distal femur. The anterior cruciate ligament was unremarkable. There were minimal degenerative changes about the lateral compartment. There was only a small lateral meniscus tear. The patellofemoral joint looked good and the patella tracked nicely. The knee was stable.   DESCRIPTION OF PROCEDURE: Preop antibiotics, adequate general anesthesia, supine position, routine prepping and draping. Appropriate time out was called. The knee was injected with Marcaine with epinephrine. The arthroscope was introduced through a lateral joint line portal. Medial portal was made with the guidance of a spinal needle. The knee was  inspected with the above-mentioned pathology found. Partial medial and lateral meniscectomies were performed with a punch motorized shaver and the ArthroWand. Very limited chondroplasty shaving was done around the area of the chondral lesion and medial femoral condyle.   The patellofemoral joint was inspected. The knee was inspected and no further pathology noted. It was again irrigated with fluid. The portals were closed with subcuticular Vicryl. The knee was injected with Marcaine, epinephrine, and morphine. A soft sterile dressing was applied. The patient was awakened and taken to the postanesthesia care unit having tolerated the procedure well.  ____________________________ Alysia Penna. Mauri Pole, MD jcc:slb D: 04/15/2011 12:05:52 ET T: 04/15/2011 12:11:50 ET JOB#: [id number]  cc: Alysia Penna. Mauri Pole, MD, <Dictator> Alysia Penna Meagon Duskin MD ELECTRONICALLY SIGNED 04/18/2011 20:09

## 2014-07-23 ENCOUNTER — Other Ambulatory Visit: Payer: Self-pay | Admitting: Family Medicine

## 2014-08-25 ENCOUNTER — Telehealth: Payer: Self-pay | Admitting: Family Medicine

## 2014-08-26 NOTE — Telephone Encounter (Signed)
Tried calling both numbers in his contact and had no success in reaching the patient.

## 2014-09-28 ENCOUNTER — Encounter: Payer: Self-pay | Admitting: Family Medicine

## 2014-09-28 DIAGNOSIS — E8881 Metabolic syndrome: Secondary | ICD-10-CM | POA: Insufficient documentation

## 2014-09-28 DIAGNOSIS — Z87442 Personal history of urinary calculi: Secondary | ICD-10-CM | POA: Insufficient documentation

## 2014-09-28 DIAGNOSIS — I10 Essential (primary) hypertension: Secondary | ICD-10-CM | POA: Insufficient documentation

## 2014-09-28 DIAGNOSIS — G43009 Migraine without aura, not intractable, without status migrainosus: Secondary | ICD-10-CM | POA: Insufficient documentation

## 2014-09-28 DIAGNOSIS — Z720 Tobacco use: Secondary | ICD-10-CM | POA: Insufficient documentation

## 2014-09-28 DIAGNOSIS — F1910 Other psychoactive substance abuse, uncomplicated: Secondary | ICD-10-CM | POA: Insufficient documentation

## 2014-09-28 DIAGNOSIS — R7989 Other specified abnormal findings of blood chemistry: Secondary | ICD-10-CM | POA: Insufficient documentation

## 2014-09-28 DIAGNOSIS — N400 Enlarged prostate without lower urinary tract symptoms: Secondary | ICD-10-CM | POA: Insufficient documentation

## 2014-09-28 DIAGNOSIS — J342 Deviated nasal septum: Secondary | ICD-10-CM | POA: Insufficient documentation

## 2014-09-28 DIAGNOSIS — J3089 Other allergic rhinitis: Secondary | ICD-10-CM

## 2014-09-28 DIAGNOSIS — E669 Obesity, unspecified: Secondary | ICD-10-CM | POA: Insufficient documentation

## 2014-09-28 DIAGNOSIS — K219 Gastro-esophageal reflux disease without esophagitis: Secondary | ICD-10-CM | POA: Insufficient documentation

## 2014-09-28 DIAGNOSIS — J302 Other seasonal allergic rhinitis: Secondary | ICD-10-CM | POA: Insufficient documentation

## 2014-09-28 DIAGNOSIS — F418 Other specified anxiety disorders: Secondary | ICD-10-CM | POA: Insufficient documentation

## 2014-09-28 DIAGNOSIS — N529 Male erectile dysfunction, unspecified: Secondary | ICD-10-CM | POA: Insufficient documentation

## 2014-09-28 DIAGNOSIS — G4733 Obstructive sleep apnea (adult) (pediatric): Secondary | ICD-10-CM | POA: Insufficient documentation

## 2014-09-28 DIAGNOSIS — E291 Testicular hypofunction: Secondary | ICD-10-CM | POA: Insufficient documentation

## 2014-09-28 DIAGNOSIS — E785 Hyperlipidemia, unspecified: Secondary | ICD-10-CM | POA: Insufficient documentation

## 2014-09-28 DIAGNOSIS — J449 Chronic obstructive pulmonary disease, unspecified: Secondary | ICD-10-CM | POA: Insufficient documentation

## 2014-10-01 ENCOUNTER — Encounter: Payer: Self-pay | Admitting: Family Medicine

## 2014-10-01 ENCOUNTER — Ambulatory Visit (INDEPENDENT_AMBULATORY_CARE_PROVIDER_SITE_OTHER): Payer: No Typology Code available for payment source | Admitting: Family Medicine

## 2014-10-01 VITALS — BP 106/70 | HR 90 | Temp 98.1°F | Resp 18 | Ht 66.0 in | Wt 211.3 lb

## 2014-10-01 DIAGNOSIS — J4489 Other specified chronic obstructive pulmonary disease: Secondary | ICD-10-CM

## 2014-10-01 DIAGNOSIS — M069 Rheumatoid arthritis, unspecified: Secondary | ICD-10-CM | POA: Diagnosis not present

## 2014-10-01 DIAGNOSIS — I10 Essential (primary) hypertension: Secondary | ICD-10-CM | POA: Diagnosis not present

## 2014-10-01 DIAGNOSIS — K219 Gastro-esophageal reflux disease without esophagitis: Secondary | ICD-10-CM | POA: Diagnosis not present

## 2014-10-01 DIAGNOSIS — D473 Essential (hemorrhagic) thrombocythemia: Secondary | ICD-10-CM

## 2014-10-01 DIAGNOSIS — E785 Hyperlipidemia, unspecified: Secondary | ICD-10-CM | POA: Diagnosis not present

## 2014-10-01 DIAGNOSIS — M150 Primary generalized (osteo)arthritis: Secondary | ICD-10-CM | POA: Diagnosis not present

## 2014-10-01 DIAGNOSIS — N529 Male erectile dysfunction, unspecified: Secondary | ICD-10-CM

## 2014-10-01 DIAGNOSIS — M159 Polyosteoarthritis, unspecified: Secondary | ICD-10-CM

## 2014-10-01 DIAGNOSIS — E8881 Metabolic syndrome: Secondary | ICD-10-CM | POA: Diagnosis not present

## 2014-10-01 DIAGNOSIS — G4733 Obstructive sleep apnea (adult) (pediatric): Secondary | ICD-10-CM | POA: Diagnosis not present

## 2014-10-01 DIAGNOSIS — Z23 Encounter for immunization: Secondary | ICD-10-CM

## 2014-10-01 DIAGNOSIS — J449 Chronic obstructive pulmonary disease, unspecified: Secondary | ICD-10-CM | POA: Diagnosis not present

## 2014-10-01 DIAGNOSIS — G43009 Migraine without aura, not intractable, without status migrainosus: Secondary | ICD-10-CM | POA: Diagnosis not present

## 2014-10-01 DIAGNOSIS — M791 Myalgia, unspecified site: Secondary | ICD-10-CM

## 2014-10-01 DIAGNOSIS — R7989 Other specified abnormal findings of blood chemistry: Secondary | ICD-10-CM

## 2014-10-01 MED ORDER — AMLODIPINE BESY-BENAZEPRIL HCL 5-40 MG PO CAPS
1.0000 | ORAL_CAPSULE | Freq: Every day | ORAL | Status: DC
Start: 2014-10-01 — End: 2015-01-01

## 2014-10-01 MED ORDER — TRAMADOL HCL 50 MG PO TABS: 50.0000 mg | ORAL_TABLET | Freq: Every evening | ORAL | Status: DC

## 2014-10-01 MED ORDER — CELECOXIB 200 MG PO CAPS: 200.0000 mg | ORAL_CAPSULE | Freq: Every day | ORAL | Status: DC

## 2014-10-01 MED ORDER — GABAPENTIN 100 MG PO CAPS: 100.0000 mg | ORAL_CAPSULE | Freq: Three times a day (TID) | ORAL | Status: DC

## 2014-10-01 MED ORDER — SILDENAFIL CITRATE 20 MG PO TABS: 20.0000 mg | ORAL_TABLET | Freq: Every day | ORAL | Status: DC

## 2014-10-01 MED ORDER — CARVEDILOL 12.5 MG PO TABS: 12.5000 mg | ORAL_TABLET | Freq: Two times a day (BID) | ORAL | Status: DC

## 2014-10-01 MED ORDER — OMEPRAZOLE 20 MG PO CPDR: 20.0000 mg | DELAYED_RELEASE_CAPSULE | Freq: Every day | ORAL | Status: DC

## 2014-10-01 NOTE — Patient Instructions (Signed)
Ask pharmacy if your insurance pays for Duloxetine/cymbalta:  Fibromyalgia Fibromyalgia is a disorder that is often misunderstood. It is associated with muscular pains and tenderness that comes and goes. It is often associated with fatigue and sleep disturbances. Though it tends to be long-lasting, fibromyalgia is not life-threatening. CAUSES  The exact cause of fibromyalgia is unknown. People with certain gene types are predisposed to developing fibromyalgia and other conditions. Certain factors can play a role as triggers, such as:  Spine disorders.  Arthritis.  Severe injury (trauma) and other physical stressors.  Emotional stressors. SYMPTOMS   The main symptom is pain and stiffness in the muscles and joints, which can vary over time.  Sleep and fatigue problems. Other related symptoms may include:  Bowel and bladder problems.  Headaches.  Visual problems.  Problems with odors and noises.  Depression or mood changes.  Painful periods (dysmenorrhea).  Dryness of the skin or eyes. DIAGNOSIS  There are no specific tests for diagnosing fibromyalgia. Patients can be diagnosed accurately from the specific symptoms they have. The diagnosis is made by determining that nothing else is causing the problems. TREATMENT  There is no cure. Management includes medicines and an active, healthy lifestyle. The goal is to enhance physical fitness, decrease pain, and improve sleep. HOME CARE INSTRUCTIONS   Only take over-the-counter or prescription medicines as directed by your caregiver. Sleeping pills, tranquilizers, and pain medicines may make your problems worse.  Low-impact aerobic exercise is very important and advised for treatment. At first, it may seem to make pain worse. Gradually increasing your tolerance will overcome this feeling.  Learning relaxation techniques and how to control stress will help you. Biofeedback, visual imagery, hypnosis, muscle relaxation, yoga, and  meditation are all options.  Anti-inflammatory medicines and physical therapy may provide short-term help.  Acupuncture or massage treatments may help.  Take muscle relaxant medicines as suggested by your caregiver.  Avoid stressful situations.  Plan a healthy lifestyle. This includes your diet, sleep, rest, exercise, and friends.  Find and practice a hobby you enjoy.  Join a fibromyalgia support group for interaction, ideas, and sharing advice. This may be helpful. SEEK MEDICAL CARE IF:  You are not having good results or improvement from your treatment. FOR MORE INFORMATION  National Fibromyalgia Association: www.fmaware.[place]: www.arthritis.org Document Released: 02/01/2005 Document Revised: 04/26/2011 Document Reviewed: 05/14/2009 [facility] Patient Information 2015 [place], [place]. This information is not intended to replace advice given to you by your health care provider. Make sure you discuss any questions you have with your health care provider.

## 2014-10-01 NOTE — Progress Notes (Signed)
Name: Tim Anderson   MRN: [id number]    DOB: 25-Jul-1965   Date:10/01/2014       Progress Note  Subjective  Chief Complaint  Chief Complaint  Patient presents with  . Medication Refill    3 month F/U  . Hypertension    Dizziness and Headaches due to Migraines. BP at home runs 120/75  . Hyperlipidemia    Body Aches all over  . Gastrophageal Reflux    While taking medication, well controlled  . Migraine    Having 2 monthly, had one last week lasted all day, takes medication prn  . COPD    Improving with medications  . Rheumatoid Arthritis    Worsening, aching all over    HPI  GERD: tried to stop medication last week, but within 3 days symptoms were severe, and got some otc medication.  Migraine: he has a couple episodes per month, he needs refill of tramadol to take medication prn, still has Maxalt at home and promethazine to take prn. He has phonophobia and photophobia with episodes of migraine, sometimes vomiting.   COPD: stopped using Spiriva, only using it prn, has daily cough, but is dry, he did not start Wellbutrin because he is not ready to quit yet  RA: sees Rheumatologist, on Enbrel, still has daily pain, sometimes effusion, he has physical job and is hard to work all day. He is out of Celebrex and would like a refill  HTN: taking medication, no chest pain, occasionally get dizzy when he bends over  Hyperlipidemia: he aches everywhere but denies taking Lipitor   FMS: feels tired all the time, muscle aches daily, joint aches, mental fogginess  Patient Active Problem List   Diagnosis Date Noted  . Benign fibroma of prostate 09/28/2014  . Bronchitis with chronic airway obstruction 09/28/2014  . Drug abuse 09/28/2014  . Deflected nasal septum 09/28/2014  . Dyslipidemia 09/28/2014  . Dysmetabolic syndrome 02/17/7251  . GERD (gastroesophageal reflux disease) 09/28/2014  . H/O renal calculi 09/28/2014  . Benign hypertension 09/28/2014  . Male hypogonadism  09/28/2014  . Failure of erection 09/28/2014  . Obstructive apnea 09/28/2014  . Depression with anxiety 09/28/2014  . Obesity (BMI 30-39.9) 09/28/2014  . Migraine without aura and responsive to treatment 09/28/2014  . Perennial allergic rhinitis with seasonal variation 09/28/2014  . Elevated platelet count 09/28/2014  . Tobacco abuse 09/28/2014  . History of TIA (transient ischemic attack) 09/20/2011  . Rheumatoid arthritis involving multiple joints 07/31/2009    Past Surgical History  Procedure Laterality Date  . Facial reconstruction surgery    . Knee surgery Right     Family History  Problem Relation Age of Onset  . Diabetes Mother   . Diabetes Father   . Heart attack Father   . Cancer Brother     Skin and Prostate-Oldest Brother  . Diabetes Brother     Social History   Social History  . Marital Status: Married    Spouse Name: N/A  . Number of Children: N/A  . Years of Education: N/A   Occupational History  . Not on file.   Social History Main Topics  . Smoking status: Current Every Day Smoker -- 0.75 packs/day for 38 years    Types: Cigarettes    Start date: 09/30/1976  . Smokeless tobacco: Never Used  . Alcohol Use: 0.0 oz/week    0 Standard drinks or equivalent per week     Comment: occasionally  . Drug Use: No  .  Sexual Activity:    Partners: Female   Other Topics Concern  . Not on file   Social History Narrative     Current outpatient prescriptions:  .  albuterol (VENTOLIN HFA) 108 (90 BASE) MCG/ACT inhaler, Inhale into the lungs., Disp: , Rfl:  .  amLODipine-benazepril (LOTREL) 5-40 MG per capsule, Take 1 capsule by mouth daily., Disp: 90 capsule, Rfl: 1 .  aspirin EC 81 MG tablet, Take 81 mg by mouth., Disp: , Rfl:  .  atorvastatin (LIPITOR) 10 MG tablet, TAKE 1 TABLET BY MOUTH EVERY DAY FOR CHOLESTEROL, Disp: 30 tablet, Rfl: 2 .  carvedilol (COREG) 12.5 MG tablet, Take 1 tablet (12.5 mg total) by mouth 2 (two) times daily with a meal., Disp:  180 tablet, Rfl: 1 .  celecoxib (CELEBREX) 200 MG capsule, Take 1 capsule (200 mg total) by mouth daily., Disp: 90 capsule, Rfl: 1 .  diclofenac sodium (VOLTAREN) 1 % GEL, Place onto the skin., Disp: , Rfl:  .  Etanercept (ENBREL) 25 MG/0.5ML SOSY, Inject into the skin., Disp: , Rfl:  .  ketorolac (TORADOL) 10 MG tablet, Take 2 tablets by mouth as needed., Disp: , Rfl:  .  omeprazole (PRILOSEC) 20 MG capsule, Take 1 capsule (20 mg total) by mouth daily., Disp: 90 capsule, Rfl: 1 .  promethazine (PHENERGAN) 25 MG tablet, Take 1 tablet by mouth every 6 (six) hours as needed., Disp: , Rfl:  .  rizatriptan (MAXALT) 10 MG tablet, Take by mouth., Disp: , Rfl:  .  sildenafil (REVATIO) 20 MG tablet, Take by mouth., Disp: , Rfl:  .  tamsulosin (FLOMAX) 0.4 MG CAPS capsule, Take by mouth., Disp: , Rfl:  .  tiotropium (SPIRIVA HANDIHALER) 18 MCG inhalation capsule, Place into inhaler and inhale., Disp: , Rfl:  .  traMADol (ULTRAM) 50 MG tablet, Take 1 tablet by mouth every evening., Disp: , Rfl:   Allergies  Allergen Reactions  . Ciprofloxacin Other (See Comments)    Body aches  . Lipofen  [Fenofibrate]     muscle aches  . Niacin Er     flushed  . Other Other (See Comments)    Depiridoc - "Stopped my heart"     ROS  Constitutional: Negative for fever positive for  weight change.  Respiratory: positive for cough but denies shortness of breath.   Cardiovascular: Negative for chest pain or palpitations.  Gastrointestinal: Negative for abdominal pain, no bowel changes.  Musculoskeletal: Negative for gait problem positive  joint swelling.  Skin: rash on his groin, using topical medication  Neurological: Negative for dizziness or positive headache occasionally .  No other specific complaints in a complete review of systems (except as listed in HPI above).  Objective  Filed Vitals:   10/01/14 1128  BP: 106/70  Pulse: 90  Temp: 98.1 F (36.7 C)  TempSrc: Oral  Resp: 18  Height: 5\' 6"   (1.676 m)  Weight: 211 lb 4.8 oz (95.845 kg)  SpO2: 96%    Body mass index is 34.12 kg/(m^2).  Physical Exam  Constitutional: Patient appears well-developed and well-nourished. Obese  No distress.  HEENT: head atraumatic, normocephalic, pupils equal and reactive to light, , neck supple, throat within normal limits Cardiovascular: Normal rate, regular rhythm and normal heart sounds.  No murmur heard. No BLE edema. Pulmonary/Chest: Effort normal and breath sounds normal. No respiratory distress. Abdominal: Soft.  There is no tenderness. Psychiatric: Patient has a normal mood and affect. behavior is normal. Judgment and thought content normal. Muscular Skeletal:  trigger points positive by 8/13 - tender joints, no effusion   PHQ2/9: Depression screen PHQ 2/9 10/01/2014  Decreased Interest 0  Down, Depressed, Hopeless 0  PHQ - 2 Score 0    Fall Risk: Fall Risk  10/01/2014  Falls in the past year? No     Assessment & Plan    1. Bronchitis with chronic airway obstruction Patient is not taking medication daily.   2. Obstructive apnea Not using CPAP machine in months and feels tired all the time, explained importance of resuming CPAP to decrease risk of strokes and MI  3. Benign hypertension Is good, no longer using cocaine, and bp has dropped so we will adjust dose , go down on Coreg from 25 to 12.5 mg - carvedilol (COREG) 12.5 MG tablet; Take 1 tablet (12.5 mg total) by mouth 2 (two) times daily with a meal.  Dispense: 180 tablet; Refill: 1 - amLODipine-benazepril (LOTREL) 5-40 MG per capsule; Take 1 capsule by mouth daily.  Dispense: 90 capsule; Refill: 1 - Comprehensive metabolic panel  4. Needs flu shot  - Flu Vaccine QUAD 36+ mos PF IM (Fluarix Quad PF)   5. Dyslipidemia  - Lipid panel  6. Dysmetabolic syndrome  - Hemoglobin A1c  7. Migraine without aura and responsive to treatment  - traMADol (ULTRAM) 50 MG tablet; Take 1 tablet (50 mg total) by mouth every  evening.  Dispense: 30 tablet; Refill: 0  8. Failure of erection  - sildenafil (REVATIO) 20 MG tablet; Take 1 tablet (20 mg total) by mouth daily.  Dispense: 10 tablet; Refill: 2  10. Gastroesophageal reflux disease without esophagitis  - omeprazole (PRILOSEC) 20 MG capsule; Take 1 capsule (20 mg total) by mouth daily.  Dispense: 90 capsule; Refill: 1  11. Rheumatoid arthritis involving multiple joints Also has OA, seeing Rheumatologist  - celecoxib (CELEBREX) 200 MG capsule; Take 1 capsule (200 mg total) by mouth daily.  Dispense: 90 capsule; Refill: 1  12. Primary osteoarthritis involving multiple joints Resume medication  - celecoxib (CELEBREX) 200 MG capsule; Take 1 capsule (200 mg total) by mouth daily.  Dispense: 90 capsule; Refill: 1   13. Myalgia Discussed options, he would like to try Duloxetine, but not covered by insurance, advised to discuss with Rheumatologist. Possible FMS we will add gabapentin

## 2014-10-27 ENCOUNTER — Other Ambulatory Visit: Payer: Self-pay | Admitting: Family Medicine

## 2014-11-06 ENCOUNTER — Other Ambulatory Visit: Payer: Self-pay | Admitting: Family Medicine

## 2014-11-20 ENCOUNTER — Other Ambulatory Visit: Payer: Self-pay | Admitting: Family Medicine

## 2014-11-20 DIAGNOSIS — R1011 Right upper quadrant pain: Secondary | ICD-10-CM

## 2014-11-21 ENCOUNTER — Ambulatory Visit
Admission: RE | Admit: 2014-11-21 | Discharge: 2014-11-21 | Disposition: A | Discharge status: Home / Self Care | Payer: No Typology Code available for payment source | Source: Ambulatory Visit | Attending: Family Medicine | Admitting: Family Medicine

## 2014-11-21 ENCOUNTER — Inpatient Hospital Stay: Admission: RE | Admit: 2014-11-21 | Payer: Self-pay | Source: Ambulatory Visit

## 2014-11-21 DIAGNOSIS — R1011 Right upper quadrant pain: Secondary | ICD-10-CM | POA: Diagnosis not present

## 2014-12-02 ENCOUNTER — Other Ambulatory Visit: Payer: Self-pay | Admitting: Family Medicine

## 2014-12-02 ENCOUNTER — Telehealth: Payer: Self-pay

## 2014-12-02 NOTE — Telephone Encounter (Signed)
Patient requesting refill. 

## 2014-12-06 NOTE — Telephone Encounter (Signed)
Open in error

## 2015-01-01 ENCOUNTER — Ambulatory Visit (INDEPENDENT_AMBULATORY_CARE_PROVIDER_SITE_OTHER): Payer: No Typology Code available for payment source | Admitting: Family Medicine

## 2015-01-01 ENCOUNTER — Encounter: Payer: Self-pay | Admitting: Family Medicine

## 2015-01-01 VITALS — BP 124/80 | HR 92 | Temp 98.2°F | Resp 16 | Ht 67.0 in | Wt 221.4 lb

## 2015-01-01 DIAGNOSIS — M1711 Unilateral primary osteoarthritis, right knee: Secondary | ICD-10-CM | POA: Diagnosis not present

## 2015-01-01 DIAGNOSIS — Z01818 Encounter for other preprocedural examination: Secondary | ICD-10-CM | POA: Diagnosis not present

## 2015-01-01 DIAGNOSIS — I10 Essential (primary) hypertension: Secondary | ICD-10-CM | POA: Diagnosis not present

## 2015-01-01 NOTE — Progress Notes (Signed)
Name: Tim Anderson   MRN: [id number]    DOB: 11/08/1965   Date:01/01/2015       Progress Note  Subjective  Chief Complaint  Chief Complaint  Patient presents with  . Knee Pain    right her for surgical clearance for knee replacement    HPI  Osteoarthritis he has a long history of knee pain, also has history of RA and was on prednisone, however seen by Dr. Luster Landsberg last week and is getting ready to have a right knee total knee replacement. He has daily pain that is affecting her ability to function. He limps, pain is described as sharp/throbbing like a toothache.   HTN: bp has been at [facility], denies chest pain or palpitation  Preop: requested by Dr. Luster Landsberg, for a total right knee replacement surgery, surgery has no set date yet, and not sure of the name of hospital .  He can perform 8 METS of physical activity without sob or chest pain.  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 He is drug free for over one year. He still smokes and has COPD but is stable no recent flare or use of antibiotics. He has not been compliant with CPAP machine. He is on a beta-blocker. Advised him to quit smoking for at least one month prior to surgery. He has partial dentures.    Patient Active Problem List   Diagnosis Date Noted  . Benign fibroma of prostate 09/28/2014  . Bronchitis with chronic airway obstruction ([place]) 09/28/2014  . Drug abuse 09/28/2014  . Deflected nasal septum 09/28/2014  . Dyslipidemia 09/28/2014  . Dysmetabolic syndrome [id number]  . GERD (gastroesophageal reflux disease) 09/28/2014  . H/O renal calculi 09/28/2014  . Benign hypertension 09/28/2014  . Male hypogonadism 09/28/2014  . Failure of erection 09/28/2014  . Obstructive apnea 09/28/2014  . Depression with anxiety 09/28/2014  . Obesity (BMI 30-39.9) 09/28/2014  . Migraine without aura and responsive to treatment 09/28/2014  . Perennial allergic rhinitis with seasonal variation 09/28/2014  .  Elevated platelet count ([place]) 09/28/2014  . Tobacco abuse 09/28/2014  . History of TIA (transient ischemic attack) 09/20/2011  . Rheumatoid arthritis involving multiple joints ([place]) 07/31/2009    Past Surgical History  Procedure Laterality Date  . Facial reconstruction surgery    . Knee surgery Right     Family History  Problem Relation Age of Onset  . Diabetes Mother   . Diabetes Father   . Heart attack Father   . Cancer Brother     Skin and Prostate-Oldest Brother  . Diabetes Brother     Social History   Social History  . Marital Status: Married    Spouse Name: N/A  . Number of Children: N/A  . Years of Education: N/A   Occupational History  . Not on file.   Social History Main Topics  . Smoking status: Current Every Day Smoker -- 0.75 packs/day for 38 years    Types: Cigarettes    Start date: 09/30/1976  . Smokeless tobacco: Never Used  . Alcohol Use: 0.0 oz/week    0 Standard drinks or equivalent per week     Comment: occasionally  . Drug Use: No  . Sexual Activity:    Partners: Female   Other Topics Concern  . Not on file   Social History Narrative     Current outpatient prescriptions:  .  albuterol (VENTOLIN HFA) 108 (90 BASE) MCG/ACT inhaler, Inhale into the lungs., Disp: ,  Rfl:  .  amLODipine-benazepril (LOTREL) 5-40 MG per capsule, TAKE ONE CAPSULE BY MOUTH EVERY DAY, Disp: 90 capsule, Rfl: 1 .  aspirin EC 81 MG tablet, Take 81 mg by mouth., Disp: , Rfl:  .  carvedilol (COREG) 12.5 MG tablet, Take 1 tablet (12.5 mg total) by mouth 2 (two) times daily with a meal., Disp: 180 tablet, Rfl: 1 .  celecoxib (CELEBREX) 200 MG capsule, Take 1 capsule (200 mg total) by mouth daily., Disp: 90 capsule, Rfl: 1 .  diclofenac sodium (VOLTAREN) 1 % GEL, Place onto the skin., Disp: , Rfl:  .  Etanercept (ENBREL) 25 MG/0.5ML SOSY, Inject into the skin., Disp: , Rfl:  .  gabapentin (NEURONTIN) 300 MG capsule, Take 1 capsule (300 mg total) by mouth 3 (three) times  daily., Disp: 90 capsule, Rfl: 2 .  ketorolac (TORADOL) 10 MG tablet, Take 2 tablets by mouth as needed., Disp: , Rfl:  .  omeprazole (PRILOSEC) 20 MG capsule, Take 1 capsule (20 mg total) by mouth daily., Disp: 90 capsule, Rfl: 1 .  promethazine (PHENERGAN) 25 MG tablet, Take 1 tablet by mouth every 6 (six) hours as needed., Disp: , Rfl:  .  rizatriptan (MAXALT) 10 MG tablet, Take by mouth., Disp: , Rfl:  .  sildenafil (REVATIO) 20 MG tablet, Take 1 tablet (20 mg total) by mouth daily., Disp: 10 tablet, Rfl: 2 .  tamsulosin (FLOMAX) 0.4 MG CAPS capsule, Take by mouth., Disp: , Rfl:  .  tiotropium (SPIRIVA HANDIHALER) 18 MCG inhalation capsule, Place into inhaler and inhale., Disp: , Rfl:  .  traMADol (ULTRAM) 50 MG tablet, TAKE 1 TABLET BY MOUTH EVERY EVENING, Disp: 30 tablet, Rfl: 0 .  VIAGRA 100 MG tablet, TAKE 1 TABLET DAILY AS NEEDED, Disp: 6 tablet, Rfl: 2  Allergies  Allergen Reactions  . Ciprofloxacin Other (See Comments)    Body aches  . Lipofen  [Fenofibrate]     muscle aches muscle aches  . Niacin Er     flushed  . Other Other (See Comments)    Depiridoc - "Stopped my heart"     ROS  Constitutional: Negative for fever, positive for weight change.  Respiratory: Positive for cough but no shortness of breath.   Cardiovascular: Negative for chest pain or palpitations.  Gastrointestinal: Negative for abdominal pain, no bowel changes.  Musculoskeletal: Positive for gait problem and joint swelling - hands ( off Enbrel and prednisone - getting ready for surgery ).  Skin: Negative for rash.  Neurological: Negative for dizziness or headache.  No other specific complaints in a complete review of systems (except as listed in HPI above).  Objective  Filed Vitals:   01/01/15 1147  BP: 124/80  Pulse: 92  Temp: 98.2 F (36.8 C)  TempSrc: Oral  Resp: 16  Height: 5\' 7"  (1.702 m)  Weight: 221 lb 6.4 oz (100.426 kg)  SpO2: 96%    Body mass index is 34.67  kg/(m^2).  Physical Exam  Constitutional: Patient appears well-developed and well-nourished. Obese  No distress.  HEENT: head atraumatic, normocephalic, pupils equal and reactive to light, neck supple, throat within normal limits Cardiovascular: Normal rate, regular rhythm and normal heart sounds.  No murmur heard. No BLE edema. Pulmonary/Chest: Effort normal and breath sounds normal. No respiratory distress. Abdominal: Soft.  There is no tenderness. Psychiatric: Patient has a normal mood and affect. behavior is normal. Judgment and thought content normal. Muscular Skeletal: pain and crepitus with extension of right knee, no effusion or redness,  no increase in warmth  PHQ2/9: Depression screen [facility] 2/9 01/01/2015 10/01/2014  Decreased Interest 0 0  Down, Depressed, Hopeless 0 0  PHQ - 2 Score 0 0    Fall Risk: Fall Risk  01/01/2015 01/01/2015 10/01/2014  Falls in the past year? Yes Yes No  Number falls in past yr: 2 or more 2 or more -  Injury with Fall? No No -    Functional Status Survey: Is the patient deaf or have difficulty hearing?: No Does the patient have difficulty seeing, even when wearing glasses/contacts?: No Does the patient have difficulty concentrating, remembering, or making decisions?: No Does the patient have difficulty walking or climbing stairs?: No Does the patient have difficulty dressing or bathing?: No Does the patient have difficulty doing errands alone such as visiting a doctor's office or shopping?: No   Assessment & Plan  1. Essential hypertension  Continue medications up to the day of surgery  - EKG 12-[place]a _CBC  2. Primary osteoarthritis of right knee  Getting ready for a knee replacement surgery , right  3. Preop examination  RCRI index is low risk, he is on beta-blockers, stop aspirin, already off Enbrel and prednisone. No further testing needed prior to surgery. Agree on being off the immunosuppressant medications prior to surgery

## 2015-01-02 ENCOUNTER — Ambulatory Visit: Payer: No Typology Code available for payment source | Admitting: Family Medicine

## 2015-01-03 LAB — COMPREHENSIVE METABOLIC PANEL
ALK PHOS: 67 IU/L (ref 39–117)
ALT: 26 IU/L (ref 0–44)
AST: 12 IU/L (ref 0–40)
Albumin/Globulin Ratio: 1.6 (ref 1.1–2.5)
Albumin: 3.9 g/dL (ref 3.5–5.5)
BILIRUBIN TOTAL: 0.2 mg/dL (ref 0.0–1.2)
BUN/Creatinine Ratio: 24 — ABNORMAL HIGH (ref 9–20)
BUN: 15 mg/dL (ref 6–24)
CHLORIDE: 101 mmol/L (ref 97–106)
CO2: 28 mmol/L (ref 18–29)
Calcium: 9.2 mg/dL (ref 8.7–10.2)
Creatinine, Ser: 0.62 mg/dL — ABNORMAL LOW (ref 0.76–1.27)
GFR calc Af Amer: 133 mL/min/{1.73_m2} (ref >59)
GFR calc non Af Amer: 115 mL/min/{1.73_m2} (ref >59)
GLUCOSE: 132 mg/dL — AB (ref 65–99)
Globulin, Total: 2.5 g/dL (ref 1.5–4.5)
Potassium: 4.9 mmol/L (ref 3.5–5.2)
Sodium: 142 mmol/L (ref 136–144)
TOTAL PROTEIN: 6.4 g/dL (ref 6.0–8.5)

## 2015-01-03 LAB — CBC WITH DIFFERENTIAL/PLATELET
BASOS ABS: 0.1 10*3/uL (ref 0.0–0.2)
BASOS: 1 %
EOS (ABSOLUTE): 0.2 10*3/uL (ref 0.0–0.4)
Eos: 1 %
Hematocrit: 40.9 % (ref 37.5–51.0)
Hemoglobin: 13.7 g/dL (ref 12.6–17.7)
IMMATURE GRANS (ABS): 0.1 10*3/uL (ref 0.0–0.1)
Immature Granulocytes: 1 %
LYMPHS: 24 %
Lymphocytes Absolute: 3.6 10*3/uL — ABNORMAL HIGH (ref 0.7–3.1)
MCH: 28.1 pg (ref 26.6–33.0)
MCHC: 33.5 g/dL (ref 31.5–35.7)
MCV: 84 fL (ref 79–97)
MONOS ABS: 0.9 10*3/uL (ref 0.1–0.9)
Monocytes: 6 %
NEUTROS ABS: 10.1 10*3/uL — AB (ref 1.4–7.0)
NEUTROS PCT: 67 %
PLATELETS: 381 10*3/uL — AB (ref 150–379)
RBC: 4.87 x10E6/uL (ref 4.14–5.80)
RDW: 14 % (ref 12.3–15.4)
WBC: 15.1 10*3/uL — ABNORMAL HIGH (ref 3.4–10.8)

## 2015-01-03 LAB — HEMOGLOBIN A1C
ESTIMATED AVERAGE GLUCOSE: 148 mg/dL
HEMOGLOBIN A1C: 6.8 % — AB (ref 4.8–5.6)

## 2015-01-03 LAB — LIPID PANEL
CHOL/HDL RATIO: 5.6 ratio — AB (ref 0.0–5.0)
Cholesterol, Total: 161 mg/dL (ref 100–199)
HDL: 29 mg/dL — ABNORMAL LOW (ref >39)
LDL Calculated: 109 mg/dL — ABNORMAL HIGH (ref 0–99)
TRIGLYCERIDES: 113 mg/dL (ref 0–149)
VLDL Cholesterol Cal: 23 mg/dL (ref 5–40)

## 2015-01-10 ENCOUNTER — Other Ambulatory Visit: Payer: Self-pay | Admitting: Family Medicine

## 2015-01-16 DIAGNOSIS — Z96659 Presence of unspecified artificial knee joint: Secondary | ICD-10-CM

## 2015-01-16 HISTORY — DX: Presence of unspecified artificial knee joint: Z96.659

## 2015-01-23 ENCOUNTER — Ambulatory Visit (INDEPENDENT_AMBULATORY_CARE_PROVIDER_SITE_OTHER): Payer: No Typology Code available for payment source | Admitting: Family Medicine

## 2015-01-26 ENCOUNTER — Other Ambulatory Visit: Payer: Self-pay | Admitting: Family Medicine

## 2015-01-27 ENCOUNTER — Ambulatory Visit (INDEPENDENT_AMBULATORY_CARE_PROVIDER_SITE_OTHER): Payer: No Typology Code available for payment source | Admitting: Family Medicine

## 2015-01-27 ENCOUNTER — Encounter: Payer: Self-pay | Admitting: Family Medicine

## 2015-01-27 VITALS — BP 112/78 | HR 104 | Temp 99.0°F | Resp 16 | Ht 67.0 in | Wt 225.3 lb

## 2015-01-27 DIAGNOSIS — J449 Chronic obstructive pulmonary disease, unspecified: Secondary | ICD-10-CM

## 2015-01-27 DIAGNOSIS — D473 Essential (hemorrhagic) thrombocythemia: Secondary | ICD-10-CM

## 2015-01-27 DIAGNOSIS — E119 Type 2 diabetes mellitus without complications: Secondary | ICD-10-CM

## 2015-01-27 DIAGNOSIS — G43009 Migraine without aura, not intractable, without status migrainosus: Secondary | ICD-10-CM | POA: Diagnosis not present

## 2015-01-27 DIAGNOSIS — G4733 Obstructive sleep apnea (adult) (pediatric): Secondary | ICD-10-CM | POA: Diagnosis not present

## 2015-01-27 DIAGNOSIS — I10 Essential (primary) hypertension: Secondary | ICD-10-CM

## 2015-01-27 DIAGNOSIS — E1169 Type 2 diabetes mellitus with other specified complication: Secondary | ICD-10-CM | POA: Insufficient documentation

## 2015-01-27 DIAGNOSIS — E785 Hyperlipidemia, unspecified: Secondary | ICD-10-CM | POA: Diagnosis not present

## 2015-01-27 DIAGNOSIS — R7989 Other specified abnormal findings of blood chemistry: Secondary | ICD-10-CM

## 2015-01-27 MED ORDER — RIZATRIPTAN BENZOATE 10 MG PO TABS: 10.0000 mg | ORAL_TABLET | ORAL | Status: DC | PRN

## 2015-01-27 NOTE — Progress Notes (Signed)
Name: Tim Anderson   MRN: [id number]    DOB: 09-Apr-1965   Date:01/27/2015       Progress Note  Subjective  Chief Complaint  Chief Complaint  Patient presents with  . Medication Refill    3 month F/U, not taking a lot of medication due to having right knee surgery on 02/03/2015 and will resume after surgery.   . Hypertension  . Gastroesophageal Reflux    Well controlled  . COPD    Cough-green mucus, SOB when activity     HPI  DMII new onset: reviewed labs with patient, hgbA1C 6.8% and fasting glucose above 126. He has a long history of prediabetes/metabolic syndrome. He has polyphagia, polydipsia or polyuria. Gave patient information about life style changes and exercise and we will also refer him to the life style center  HTN: taking bp medication and denies side effects of medications. He denies chest pain or palpitation  GERD: taking medication daily and symptoms of heartburn and regurgitation, but symptoms resumes when without the medication for one day.   Chronic bronchitis: still has a smoker's cough most mornings. Occasionally productive, it may be yellow or green. Still smoking and is not ready to quit.   RA: off Enbrel, because he will have right knee replacement surgery next week,  He is now having joint - hand pains, knees pain and left hip.    OSA: he is not using his CPAP machine, explained increased risk of heart attacks and strokes.   Migraine headache: no recent episodes. When present he has photophobia, phonophobia, nausea . Taking Maxalt prn.   Dyslipidemia: HDL is very low. To improve HDL patient  needs to eat tree nuts ( pecans/pistachios/almonds ) four times weekly, eat fish two times weekly  and exercise  at least 150 minutes per week   Patient Active Problem List   Diagnosis Date Noted  . Diabetes mellitus type 2, diet-controlled ([place]) 01/27/2015  . Benign fibroma of prostate 09/28/2014  . Bronchitis with chronic airway obstruction ([place]) 09/28/2014   . Drug abuse 09/28/2014  . Deflected nasal septum 09/28/2014  . Dyslipidemia 09/28/2014  . Dysmetabolic syndrome [id number]  . GERD (gastroesophageal reflux disease) 09/28/2014  . H/O renal calculi 09/28/2014  . Benign hypertension 09/28/2014  . Male hypogonadism 09/28/2014  . Failure of erection 09/28/2014  . Obstructive apnea 09/28/2014  . Depression with anxiety 09/28/2014  . Obesity (BMI 30-39.9) 09/28/2014  . Migraine without aura and responsive to treatment 09/28/2014  . Perennial allergic rhinitis with seasonal variation 09/28/2014  . Elevated platelet count ([place]) 09/28/2014  . Tobacco abuse 09/28/2014  . History of TIA (transient ischemic attack) 09/20/2011  . Rheumatoid arthritis involving multiple joints ([place]) 07/31/2009    Past Surgical History  Procedure Laterality Date  . Facial reconstruction surgery    . Knee surgery Right     Family History  Problem Relation Age of Onset  . Diabetes Mother   . Diabetes Father   . Heart attack Father   . Cancer Brother     Skin and Prostate-Oldest Brother  . Diabetes Brother     Social History   Social History  . Marital Status: Married    Spouse Name: N/A  . Number of Children: N/A  . Years of Education: N/A   Occupational History  . Not on file.   Social History Main Topics  . Smoking status: Current Every Day Smoker -- 0.75 packs/day for 38 years    Types: Cigarettes  Start date: 09/30/1976  . Smokeless tobacco: Never Used  . Alcohol Use: 0.0 oz/week    0 Standard drinks or equivalent per week     Comment: occasionally  . Drug Use: No  . Sexual Activity:    Partners: Female   Other Topics Concern  . Not on file   Social History Narrative     Current outpatient prescriptions:  .  albuterol (VENTOLIN HFA) 108 (90 BASE) MCG/ACT inhaler, Inhale into the lungs., Disp: , Rfl:  .  amLODipine-benazepril (LOTREL) 5-40 MG per capsule, TAKE ONE CAPSULE BY MOUTH EVERY DAY, Disp: 90 capsule, Rfl: 1 .   aspirin EC 81 MG tablet, Take 81 mg by mouth., Disp: , Rfl:  .  carvedilol (COREG) 12.5 MG tablet, Take 1 tablet (12.5 mg total) by mouth 2 (two) times daily with a meal., Disp: 180 tablet, Rfl: 1 .  celecoxib (CELEBREX) 200 MG capsule, Take by mouth., Disp: , Rfl:  .  diclofenac sodium (VOLTAREN) 1 % GEL, Place onto the skin., Disp: , Rfl:  .  Etanercept (ENBREL) 25 MG/0.5ML SOSY, Inject into the skin., Disp: , Rfl:  .  gabapentin (NEURONTIN) 300 MG capsule, Take 1 capsule (300 mg total) by mouth 3 (three) times daily., Disp: 90 capsule, Rfl: 2 .  ketorolac (TORADOL) 10 MG tablet, TAKE 2 TABLETS BY MOUTH EVERY 6 HOURS FOR MIGRAINE, Disp: 60 tablet, Rfl: 0 .  omeprazole (PRILOSEC) 20 MG capsule, Take 1 capsule (20 mg total) by mouth daily., Disp: 90 capsule, Rfl: 1 .  promethazine (PHENERGAN) 25 MG tablet, Take 1 tablet by mouth every 6 (six) hours as needed., Disp: , Rfl:  .  rizatriptan (MAXALT) 10 MG tablet, Take by mouth., Disp: , Rfl:  .  sildenafil (REVATIO) 20 MG tablet, Take 1 tablet (20 mg total) by mouth daily., Disp: 10 tablet, Rfl: 2 .  tamsulosin (FLOMAX) 0.4 MG CAPS capsule, Take by mouth., Disp: , Rfl:  .  tiotropium (SPIRIVA HANDIHALER) 18 MCG inhalation capsule, Place into inhaler and inhale., Disp: , Rfl:  .  traMADol (ULTRAM) 50 MG tablet, TAKE 1 TABLET BY MOUTH EVERY EVENING, Disp: 30 tablet, Rfl: 0 .  VIAGRA 100 MG tablet, TAKE 1 TABLET DAILY AS NEEDED, Disp: 6 tablet, Rfl: 2  Allergies  Allergen Reactions  . Ciprofloxacin Other (See Comments)    Body aches  . Lipofen  [Fenofibrate]     muscle aches muscle aches  . Niacin Er     flushed  . Other Other (See Comments)    Depiridoc - "Stopped my heart"     ROS  Constitutional: Negative for fever and positive for weight gain.  Respiratory: Positive for cough and shortness of breath.   Cardiovascular: Negative for chest pain or palpitations.  Gastrointestinal: Negative for abdominal pain, no bowel changes.   Musculoskeletal: Positive  for gait problem or joint swelling.  Skin: Negative for rash.  Neurological: Negative for dizziness, intermittent  headache.  No other specific complaints in a complete review of systems (except as listed in HPI above).  Objective  Filed Vitals:   01/27/15 1030  BP: 112/78  Pulse: 104  Temp: 99 F (37.2 C)  TempSrc: Oral  Resp: 16  Height: 5\' 7"  (1.702 m)  Weight: 225 lb 4.8 oz (102.195 kg)  SpO2: 95%    Body mass index is 35.28 kg/(m^2).  Physical Exam  Constitutional: Patient appears well-developed and well-nourished. Obese  No distress.  HEENT: head atraumatic, normocephalic, pupils equal and reactive to  light,  neck supple, throat within normal limits Cardiovascular: Normal rate, regular rhythm and normal heart sounds.  No murmur heard. No BLE edema. Pulmonary/Chest: Effort normal and breath sounds normal. No respiratory distress. Abdominal: Soft.  There is no tenderness. Psychiatric: Patient has a normal mood and affect. behavior is normal. Judgment and thought content normal. Muscular skeletal: some synovitis both hands, pain with extension of both knee  Recent Results (from the past 2160 hour(s))  Lipid panel     Status: Abnormal   Collection Time: 01/02/15  8:30 AM  Result Value Ref Range   Cholesterol, Total 161 100 - 199 mg/dL   Triglycerides 113 0 - 149 mg/dL   HDL 29 (L) >39 mg/dL   VLDL Cholesterol Cal 23 5 - 40 mg/dL   LDL Calculated 109 (H) 0 - 99 mg/dL   Chol/HDL Ratio 5.6 (H) 0.0 - 5.0 ratio units    Comment:                                   T. Chol/HDL Ratio                                             Men  Women                               1/2 Avg.Risk  3.4    3.3                                   Avg.Risk  5.0    4.4                                2X Avg.Risk  9.6    7.1                                3X Avg.Risk 23.4   11.0   Comprehensive metabolic panel     Status: Abnormal   Collection Time: 01/02/15  8:30 AM   Result Value Ref Range   Glucose 132 (H) 65 - 99 mg/dL   BUN 15 6 - 24 mg/dL   Creatinine, Ser 0.62 (L) 0.76 - 1.27 mg/dL   GFR calc non Af Amer 115 >59 mL/min/1.73   GFR calc Af Amer 133 >59 mL/min/1.73   BUN/Creatinine Ratio 24 (H) 9 - 20   Sodium 142 136 - 144 mmol/L   Potassium 4.9 3.5 - 5.2 mmol/L   Chloride 101 97 - 106 mmol/L   CO2 28 18 - 29 mmol/L   Calcium 9.2 8.7 - 10.2 mg/dL   Total Protein 6.4 6.0 - 8.5 g/dL   Albumin 3.9 3.5 - 5.5 g/dL   Globulin, Total 2.5 1.5 - 4.5 g/dL   Albumin/Globulin Ratio 1.6 1.1 - 2.5   Bilirubin Total 0.2 0.0 - 1.2 mg/dL   Alkaline Phosphatase 67 39 - 117 IU/L   AST 12 0 - 40 IU/L   ALT 26 0 - 44 IU/L  Hemoglobin A1c     Status: Abnormal   Collection Time: 01/02/15  8:30 AM  Result Value Ref Range   Hgb A1c MFr Bld 6.8 (H) 4.8 - 5.6 %    Comment:          Pre-diabetes: 5.7 - 6.4          Diabetes: >6.4          Glycemic control for adults with diabetes: <7.0    Est. average glucose Bld gHb Est-mCnc 148 mg/dL  CBC with Differential/Platelet     Status: Abnormal   Collection Time: 01/02/15  8:30 AM  Result Value Ref Range   WBC 15.1 (H) 3.4 - 10.8 x10E3/uL   RBC 4.87 4.14 - 5.80 x10E6/uL   Hemoglobin 13.7 12.6 - 17.7 g/dL   Hematocrit 40.9 37.5 - 51.0 %   MCV 84 79 - 97 fL   MCH 28.1 26.6 - 33.0 pg   MCHC 33.5 31.5 - 35.7 g/dL   RDW 14.0 12.3 - 15.4 %   Platelets 381 (H) 150 - 379 x10E3/uL   Neutrophils 67 %   Lymphs 24 %   Monocytes 6 %   Eos 1 %   Basos 1 %   Neutrophils Absolute 10.1 (H) 1.4 - 7.0 x10E3/uL   Lymphocytes Absolute 3.6 (H) 0.7 - 3.1 x10E3/uL   Monocytes Absolute 0.9 0.1 - 0.9 x10E3/uL   EOS (ABSOLUTE) 0.2 0.0 - 0.4 x10E3/uL   Basophils Absolute 0.1 0.0 - 0.2 x10E3/uL   Immature Granulocytes 1 %   Immature Grans (Abs) 0.1 0.0 - 0.1 x10E3/uL     PHQ2/9: Depression screen [facility] 2/9 01/01/2015 10/01/2014  Decreased Interest 0 0  Down, Depressed, Hopeless 0 0  PHQ - 2 Score 0 0     Fall Risk: Fall Risk   01/01/2015 01/01/2015 10/01/2014  Falls in the past year? Yes Yes No  Number falls in past yr: 2 or more 2 or more -  Injury with Fall? No No -     Assessment & Plan  1. Diabetes mellitus type 2, diet-controlled ([place])  - Ambulatory referral to diabetic education  2. Dyslipidemia  Continue medication, needs to change the diet  3. Elevated platelet count ([facility])  Chronic - he was off Enbrel, we will repeat when back on medication to see if inflammation markers goes down.   4. Obstructive apnea  He needs to resume CPAP machine  5. Benign hypertension  Under control   6. Migraine without aura and responsive to treatment  Continue prn medication   7. Bronchitis with chronic airway obstruction ([place])  Needs to quit smoking

## 2015-01-30 ENCOUNTER — Other Ambulatory Visit: Payer: Self-pay | Admitting: Family Medicine

## 2015-02-03 DIAGNOSIS — M179 Osteoarthritis of knee, unspecified: Secondary | ICD-10-CM | POA: Insufficient documentation

## 2015-02-03 DIAGNOSIS — M1710 Unilateral primary osteoarthritis, unspecified knee: Secondary | ICD-10-CM | POA: Insufficient documentation

## 2015-02-25 ENCOUNTER — Other Ambulatory Visit: Payer: Self-pay | Admitting: Family Medicine

## 2015-02-25 NOTE — Telephone Encounter (Signed)
Patient requesting refill. 

## 2015-03-12 ENCOUNTER — Ambulatory Visit: Payer: No Typology Code available for payment source | Admitting: *Deleted

## 2015-03-27 ENCOUNTER — Other Ambulatory Visit: Payer: Self-pay | Admitting: Family Medicine

## 2015-03-27 NOTE — Telephone Encounter (Signed)
Patient requesting refill. 

## 2015-04-28 ENCOUNTER — Ambulatory Visit (INDEPENDENT_AMBULATORY_CARE_PROVIDER_SITE_OTHER): Payer: 59 | Admitting: Family Medicine

## 2015-04-28 ENCOUNTER — Encounter: Payer: Self-pay | Admitting: Family Medicine

## 2015-04-28 VITALS — BP 118/78 | HR 85 | Temp 98.2°F | Resp 16 | Wt 220.3 lb

## 2015-04-28 DIAGNOSIS — J449 Chronic obstructive pulmonary disease, unspecified: Secondary | ICD-10-CM

## 2015-04-28 DIAGNOSIS — N400 Enlarged prostate without lower urinary tract symptoms: Secondary | ICD-10-CM

## 2015-04-28 DIAGNOSIS — D473 Essential (hemorrhagic) thrombocythemia: Secondary | ICD-10-CM

## 2015-04-28 DIAGNOSIS — E119 Type 2 diabetes mellitus without complications: Secondary | ICD-10-CM

## 2015-04-28 DIAGNOSIS — E785 Hyperlipidemia, unspecified: Secondary | ICD-10-CM

## 2015-04-28 DIAGNOSIS — M150 Primary generalized (osteo)arthritis: Secondary | ICD-10-CM

## 2015-04-28 DIAGNOSIS — G4733 Obstructive sleep apnea (adult) (pediatric): Secondary | ICD-10-CM

## 2015-04-28 DIAGNOSIS — R7989 Other specified abnormal findings of blood chemistry: Secondary | ICD-10-CM

## 2015-04-28 DIAGNOSIS — I10 Essential (primary) hypertension: Secondary | ICD-10-CM | POA: Diagnosis not present

## 2015-04-28 DIAGNOSIS — M159 Polyosteoarthritis, unspecified: Secondary | ICD-10-CM

## 2015-04-28 DIAGNOSIS — M069 Rheumatoid arthritis, unspecified: Secondary | ICD-10-CM

## 2015-04-28 DIAGNOSIS — G43009 Migraine without aura, not intractable, without status migrainosus: Secondary | ICD-10-CM

## 2015-04-28 LAB — POCT GLYCOSYLATED HEMOGLOBIN (HGB A1C): HEMOGLOBIN A1C: 6

## 2015-04-28 MED ORDER — CARVEDILOL 12.5 MG PO TABS: 12.5000 mg | ORAL_TABLET | Freq: Two times a day (BID) | ORAL | Status: DC

## 2015-04-28 MED ORDER — KETOROLAC TROMETHAMINE 10 MG PO TABS: 10.0000 mg | ORAL_TABLET | Freq: Four times a day (QID) | ORAL | Status: DC | PRN

## 2015-04-28 MED ORDER — AMLODIPINE BESY-BENAZEPRIL HCL 5-40 MG PO CAPS: 1.0000 | ORAL_CAPSULE | Freq: Every day | ORAL | Status: DC

## 2015-04-28 MED ORDER — ROSUVASTATIN CALCIUM 5 MG PO TABS: 5.0000 mg | ORAL_TABLET | Freq: Every day | ORAL | Status: DC

## 2015-04-28 MED ORDER — TAMSULOSIN HCL 0.4 MG PO CAPS: 0.4000 mg | ORAL_CAPSULE | Freq: Every day | ORAL | Status: DC

## 2015-04-28 MED ORDER — DICLOFENAC SODIUM 1 % TD GEL: 4.0000 g | Freq: Four times a day (QID) | TRANSDERMAL | Status: DC

## 2015-04-28 NOTE — Progress Notes (Signed)
Name: Tim Anderson   MRN: [id number]    DOB: 1965-05-12   Date:04/28/2015       Progress Note  Subjective  Chief Complaint  Chief Complaint  Patient presents with  . Diabetes    patient is here for his 68-month f/u  . Hypertension    patient stated that he has woken up dizzy in the middle of the night.  Marland Kitchen COPD  . Gastroesophageal Reflux  . Apnea  . Migraine    patient stated that they have not been bad  . Medication Refill    toradol    HPI  GERD: he is on medication and symptoms are controlled.   OSA: uses CPAP machine every night, but only keeps it on for a couple of hours, max 3 hours per night  OA: he has OA of multiple joints, he had a right  total knee replacement in December and the pain has improved, good rom, will need left hip replacement and left knee replacement in the near future.   Migraine:last migraine episode was back in November, still has Maxalt at home and promethazine to take prn. He has phonophobia and photophobia with episodes of migraine, sometimes vomiting.   COPD: taking Spiriva prn , only using it prn, has daily cough, but is dry, he is not ready to quit smoking at this time.   RA: sees Rheumatologist, on Enbrel, still has daily pain but doing better now. Pain is 5/10, sometimes effusion, he has physical job and is hard to work all day.   HTN: taking medication, no chest pain, no palpitation. Also takes baby aspirin daily  DMII: diagnosed Nov 2016, he is on diet only, due for eye exam. He denies polyphagia or polyuria, but he has polydipsia. HgbA1c is good today.  Hyperlipidemia: he has Lipitor at home but is not taking it because it causes bodyaches, advised to switch to Crestor and he is willing to try it  Dizziness: he woke up two days ago and the room was spinning, it resolved by itself, he vomited once, he has history of vertigo and seen by ENT in the past. Advised to go back to ENT if symptoms return  Patient Active Problem List    Diagnosis Date Noted  . Arthritis of knee, degenerative 02/03/2015  . Diabetes mellitus type 2, diet-controlled ([place]) 01/27/2015  . Benign fibroma of prostate 09/28/2014  . Bronchitis with chronic airway obstruction ([place]) 09/28/2014  . Drug abuse 09/28/2014  . Deflected nasal septum 09/28/2014  . Dyslipidemia 09/28/2014  . Dysmetabolic syndrome [id number]  . GERD (gastroesophageal reflux disease) 09/28/2014  . H/O renal calculi 09/28/2014  . Benign hypertension 09/28/2014  . Male hypogonadism 09/28/2014  . Failure of erection 09/28/2014  . Obstructive apnea 09/28/2014  . Depression with anxiety 09/28/2014  . Obesity (BMI 30-39.9) 09/28/2014  . Migraine without aura and responsive to treatment 09/28/2014  . Perennial allergic rhinitis with seasonal variation 09/28/2014  . Elevated platelet count ([place]) 09/28/2014  . Tobacco abuse 09/28/2014  . Cerebrovascular accident (CVA) ([place]) 07/27/2013  . History of TIA (transient ischemic attack) 09/20/2011  . Rheumatoid arthritis involving multiple joints ([place]) 07/31/2009    Past Surgical History  Procedure Laterality Date  . Facial reconstruction surgery    . Knee surgery Right     Family History  Problem Relation Age of Onset  . Diabetes Mother   . Diabetes Father   . Heart attack Father   . Cancer Brother     Skin and  Prostate-Oldest Brother  . Diabetes Brother     Social History   Social History  . Marital Status: Married    Spouse Name: N/A  . Number of Children: N/A  . Years of Education: N/A   Occupational History  . Not on file.   Social History Main Topics  . Smoking status: Current Every Day Smoker -- 0.75 packs/day for 38 years    Types: Cigarettes    Start date: 09/30/1976  . Smokeless tobacco: Never Used  . Alcohol Use: 0.0 oz/week    0 Standard drinks or equivalent per week     Comment: occasionally  . Drug Use: No  . Sexual Activity:    Partners: Female   Other Topics Concern  . Not on file    Social History Narrative     Current outpatient prescriptions:  .  amLODipine-benazepril (LOTREL) 5-40 MG capsule, Take 1 capsule by mouth daily., Disp: 90 capsule, Rfl: 1 .  aspirin EC 81 MG tablet, Take 81 mg by mouth., Disp: , Rfl:  .  carvedilol (COREG) 12.5 MG tablet, Take 1 tablet (12.5 mg total) by mouth 2 (two) times daily with a meal., Disp: 180 tablet, Rfl: 1 .  diclofenac sodium (VOLTAREN) 1 % GEL, Apply 4 g topically 4 (four) times daily., Disp: 300 g, Rfl: 1 .  Etanercept (ENBREL) 25 MG/0.5ML SOSY, Inject into the skin., Disp: , Rfl:  .  omeprazole (PRILOSEC) 20 MG capsule, TAKE 1 CAPSULE (20 MG TOTAL) BY MOUTH DAILY., Disp: 90 capsule, Rfl: 1 .  tamsulosin (FLOMAX) 0.4 MG CAPS capsule, Take 1 capsule (0.4 mg total) by mouth daily., Disp: 90 capsule, Rfl: 1 .  albuterol (VENTOLIN HFA) 108 (90 BASE) MCG/ACT inhaler, Inhale into the lungs. Reported on 04/28/2015, Disp: , Rfl:  .  ketorolac (TORADOL) 10 MG tablet, Take 1 tablet (10 mg total) by mouth every 6 (six) hours as needed., Disp: 60 tablet, Rfl: 0 .  rizatriptan (MAXALT) 10 MG tablet, Take 1 tablet (10 mg total) by mouth as needed for migraine. (Patient not taking: Reported on 04/28/2015), Disp: 10 tablet, Rfl: 0 .  sildenafil (REVATIO) 20 MG tablet, Take 1 tablet (20 mg total) by mouth daily. (Patient not taking: Reported on 04/28/2015), Disp: 10 tablet, Rfl: 2 .  tiotropium (SPIRIVA HANDIHALER) 18 MCG inhalation capsule, Place into inhaler and inhale. Reported on 04/28/2015, Disp: , Rfl:   Allergies  Allergen Reactions  . Ciprofloxacin Other (See Comments)    Body aches  . Lipofen  [Fenofibrate]     muscle aches muscle aches  . Niacin Er     flushed  . Other Other (See Comments)    Depiridoc - "Stopped my heart"     ROS  Constitutional: Negative for fever or weight change.  Respiratory: positive for cough but he denies shortness of breath.   Cardiovascular: Negative for chest pain or palpitations.   Gastrointestinal: Negative for abdominal pain, no bowel changes.  Musculoskeletal: Positive  for gait problem - limps sometimes because of left hip pain, occasionally has  joint swelling.  Skin: Negative for rash.  Neurological: positive for intermittent  Dizziness and  headache.  No other specific complaints in a complete review of systems (except as listed in HPI above).  Objective  Filed Vitals:   04/28/15 0853  BP: 118/78  Pulse: 85  Temp: 98.2 F (36.8 C)  TempSrc: Oral  Resp: 16  Weight: 220 lb 4.8 oz (99.927 kg)  SpO2: 97%    Body mass  index is 34.5 kg/(m^2).  Physical Exam  Constitutional: Patient appears well-developed and well-nourished. Obese  No distress.  HEENT: head atraumatic, normocephalic, pupils equal and reactive to light, ears normal TM bilateraly, neck supple, throat within normal limits Cardiovascular: Normal rate, regular rhythm and normal heart sounds.  No murmur heard. No BLE edema. Pulmonary/Chest: Effort normal and breath sounds normal. No respiratory distress. Abdominal: Soft.  There is no tenderness. Psychiatric: Patient has a normal mood and affect. behavior is normal. Judgment and thought content normal. Muscular Skeletal: good rom of right knee, mild effusion, synovitis of the right middle finger  Recent Results (from the past 2160 hour(s))  POCT glycosylated hemoglobin (Hb A1C)     Status: Normal   Collection Time: 04/28/15  9:02 AM  Result Value Ref Range   Hemoglobin A1C 6.0     Diabetic Foot Exam: Diabetic Foot Exam - Simple   Simple Foot Form  Diabetic Foot exam was performed with the following findings:  Yes 04/28/2015  9:28 AM  Visual Inspection  No deformities, no ulcerations, no other skin breakdown bilaterally:  Yes  Sensation Testing  Intact to touch and monofilament testing bilaterally:  Yes  Pulse Check  Posterior Tibialis and Dorsalis pulse intact bilaterally:  Yes  Comments  Thick second toenail left foot        PHQ2/9: Depression screen [facility] 2/9 04/28/2015 01/01/2015 10/01/2014  Decreased Interest 0 0 0  Down, Depressed, Hopeless 0 0 0  PHQ - 2 Score 0 0 0    Fall Risk: Fall Risk  04/28/2015 01/01/2015 01/01/2015 10/01/2014  Falls in the past year? No Yes Yes No  Number falls in past yr: - 2 or more 2 or more -  Injury with Fall? - No No -    Functional Status Survey: Is the patient deaf or have difficulty hearing?: No Does the patient have difficulty seeing, even when wearing glasses/contacts?: No Does the patient have difficulty concentrating, remembering, or making decisions?: No Does the patient have difficulty walking or climbing stairs?: No Does the patient have difficulty dressing or bathing?: No Does the patient have difficulty doing errands alone such as visiting a doctor's office or shopping?: No    Assessment & Plan  1. Obstructive apnea  Needs to keep CPAP on for at least 4 hours per day  2. Bronchitis with chronic airway obstruction (HCC)  - Spirometry: Peak  3. Diabetes mellitus type 2, diet-controlled (HCC)  - POCT glycosylated hemoglobin (Hb A1C)  4. Dyslipidemia  -Crestor 5 mg #90 and one refill  5. Essential hypertension  - carvedilol (COREG) 12.5 MG tablet; Take 1 tablet (12.5 mg total) by mouth 2 (two) times daily with a meal.  Dispense: 180 tablet; Refill: 1 - amLODipine-benazepril (LOTREL) 5-40 MG capsule; Take 1 capsule by mouth daily.  Dispense: 90 capsule; Refill: 1  6. Rheumatoid arthritis involving multiple joints ([place])  Needs to keep follow up with Rheumatologist  7. Primary osteoarthritis involving multiple joints  - diclofenac sodium (VOLTAREN) 1 % GEL; Apply 4 g topically 4 (four) times daily.  Dispense: 300 g; Refill: 1  8. Elevated platelet count ([place])  He would like to hold off on labs until next visit  9. Benign fibroma of prostate  - tamsulosin (FLOMAX) 0.4 MG CAPS capsule; Take 1 capsule (0.4 mg total) by mouth daily.   Dispense: 90 capsule; Refill: 1  10. Migraine without aura and responsive to treatment  - ketorolac (TORADOL) 10 MG tablet; Take 1 tablet (10  mg total) by mouth every 6 (six) hours as needed.  Dispense: 60 tablet; Refill: 0

## 2015-06-02 ENCOUNTER — Other Ambulatory Visit: Payer: Self-pay | Admitting: Family Medicine

## 2015-07-29 ENCOUNTER — Ambulatory Visit (INDEPENDENT_AMBULATORY_CARE_PROVIDER_SITE_OTHER): Payer: 59 | Admitting: Family Medicine

## 2015-07-29 ENCOUNTER — Encounter: Payer: Self-pay | Admitting: Family Medicine

## 2015-07-29 VITALS — BP 118/76 | HR 92 | Temp 98.0°F | Resp 16 | Ht 67.0 in | Wt 208.9 lb

## 2015-07-29 DIAGNOSIS — I10 Essential (primary) hypertension: Secondary | ICD-10-CM | POA: Diagnosis not present

## 2015-07-29 DIAGNOSIS — M150 Primary generalized (osteo)arthritis: Secondary | ICD-10-CM | POA: Diagnosis not present

## 2015-07-29 DIAGNOSIS — D72829 Elevated white blood cell count, unspecified: Secondary | ICD-10-CM | POA: Diagnosis not present

## 2015-07-29 DIAGNOSIS — J411 Mucopurulent chronic bronchitis: Secondary | ICD-10-CM

## 2015-07-29 DIAGNOSIS — M159 Polyosteoarthritis, unspecified: Secondary | ICD-10-CM

## 2015-07-29 DIAGNOSIS — K219 Gastro-esophageal reflux disease without esophagitis: Secondary | ICD-10-CM | POA: Diagnosis not present

## 2015-07-29 DIAGNOSIS — E119 Type 2 diabetes mellitus without complications: Secondary | ICD-10-CM

## 2015-07-29 DIAGNOSIS — G43009 Migraine without aura, not intractable, without status migrainosus: Secondary | ICD-10-CM | POA: Diagnosis not present

## 2015-07-29 DIAGNOSIS — G4733 Obstructive sleep apnea (adult) (pediatric): Secondary | ICD-10-CM

## 2015-07-29 DIAGNOSIS — M069 Rheumatoid arthritis, unspecified: Secondary | ICD-10-CM | POA: Diagnosis not present

## 2015-07-29 DIAGNOSIS — N529 Male erectile dysfunction, unspecified: Secondary | ICD-10-CM

## 2015-07-29 DIAGNOSIS — E785 Hyperlipidemia, unspecified: Secondary | ICD-10-CM

## 2015-07-29 DIAGNOSIS — Z79899 Other long term (current) drug therapy: Secondary | ICD-10-CM | POA: Diagnosis not present

## 2015-07-29 MED ORDER — IPRATROPIUM-ALBUTEROL 18-103 MCG/ACT IN AERO: 2.0000 | INHALATION_SPRAY | Freq: Four times a day (QID) | RESPIRATORY_TRACT | Status: DC | PRN

## 2015-07-29 MED ORDER — OMEPRAZOLE 20 MG PO CPDR: 20.0000 mg | DELAYED_RELEASE_CAPSULE | Freq: Every day | ORAL | Status: DC

## 2015-07-29 MED ORDER — RIZATRIPTAN BENZOATE 10 MG PO TABS: 10.0000 mg | ORAL_TABLET | ORAL | Status: DC | PRN

## 2015-07-29 MED ORDER — KETOROLAC TROMETHAMINE 10 MG PO TABS: 10.0000 mg | ORAL_TABLET | Freq: Four times a day (QID) | ORAL | Status: DC | PRN

## 2015-07-29 NOTE — Addendum Note (Signed)
Addended by: Inda Coke on: 07/29/2015 09:50 AM   Modules accepted: Orders

## 2015-07-29 NOTE — Progress Notes (Signed)
Name: Tim Anderson   MRN: [id number]    DOB: 1966/01/06   Date:07/29/2015       Progress Note  Subjective  Chief Complaint  Chief Complaint  Patient presents with  . Medication Refill  . Hypertension    patient is here for his 70-month f/u.  . Diabetes  . Osteoarthritis  . Apnea  . Migraine  . Gastroesophageal Reflux    occasionally  . Hip Pain    left hip pain  . Chest Pain    patient has some intermittent pains in his chest mid to left pectorals    HPI  GERD: he is on medication and symptoms are controlled. He states he has heartburn if he skips one dose of medication, he also has intermittent regurgitation   OSA: he has not been using CPAP machine anymore. He states he stopped about 4 years ago because he was feeling claustrophobic.   OA: he has OA of multiple joints, he had a right total knee replacement in December and the pain has improved, good rom, will need left hip replacement and left knee replacement in the near future. He has daily left hip pain, he is back to work but is afraid to take time off until he has a good crew to work under him.   Migraine/headaches: he has dull ache/tension type headache that goes from neck to top of the head almost every other day, he takes Toradol prn for that. He has migraine seldom. He has an episode less than once a month and he takes  Maxalt at home and promethazine prn  He has phonophobia and photophobia with episodes of migraine, sometimes vomiting.    COPD: no longer using Sipiriva, using Ventolin prn only, has daily cough, but is dry, he is not ready to quit smoking at this time.   RA: sees Rheumatologist, off Enbrel until he can established with a new provider ( insurance change ), still has daily pain but doing better now. Pain is 5/10, sometimes effusion, he has physical job and is hard to work all day.   HTN: taking medication, no chest pain, no palpitation. Also takes baby aspirin daily  DMII: diagnosed Nov 2016, he  is on diet only, due for eye exam. He denies polyphagia or polyuria, but he has polydipsia. HgbA1c is good today.   Hyperlipidemia: he stopped Lipitor and  Crestor because of muscle aches.   Patient Active Problem List   Diagnosis Date Noted  . Primary osteoarthritis involving multiple joints 07/29/2015  . Arthritis of knee, degenerative 02/03/2015  . Diabetes mellitus type 2, diet-controlled ([place]) 01/27/2015  . Benign fibroma of prostate 09/28/2014  . Bronchitis with chronic airway obstruction ([place]) 09/28/2014  . Drug abuse 09/28/2014  . Deflected nasal septum 09/28/2014  . Dyslipidemia 09/28/2014  . Dysmetabolic syndrome [id number]  . GERD (gastroesophageal reflux disease) 09/28/2014  . H/O renal calculi 09/28/2014  . Benign hypertension 09/28/2014  . Male hypogonadism 09/28/2014  . Failure of erection 09/28/2014  . Obstructive apnea 09/28/2014  . Depression with anxiety 09/28/2014  . Obesity (BMI 30-39.9) 09/28/2014  . Migraine without aura and responsive to treatment 09/28/2014  . Perennial allergic rhinitis with seasonal variation 09/28/2014  . Elevated platelet count ([place]) 09/28/2014  . Tobacco abuse 09/28/2014  . Cerebrovascular accident (CVA) ([place]) 07/27/2013  . History of TIA (transient ischemic attack) 09/20/2011  . Rheumatoid arthritis involving multiple joints ([place]) 07/31/2009    Past Surgical History  Procedure Laterality Date  . Facial reconstruction  surgery    . Knee surgery Right     Family History  Problem Relation Age of Onset  . Diabetes Mother   . Diabetes Father   . Heart attack Father   . Cancer Brother     Skin and Prostate-Oldest Brother  . Diabetes Brother     Social History   Social History  . Marital Status: Married    Spouse Name: N/A  . Number of Children: N/A  . Years of Education: N/A   Occupational History  . Not on file.   Social History Main Topics  . Smoking status: Current Every Day Smoker -- 0.75 packs/day for 38 years     Types: Cigarettes    Start date: 09/30/1976  . Smokeless tobacco: Never Used  . Alcohol Use: 0.0 oz/week    0 Standard drinks or equivalent per week     Comment: occasionally  . Drug Use: No  . Sexual Activity:    Partners: Female   Other Topics Concern  . Not on file   Social History Narrative     Current outpatient prescriptions:  .  amLODipine-benazepril (LOTREL) 5-40 MG capsule, TAKE 1 CAPSULE BY MOUTH DAILY., Disp: 90 capsule, Rfl: 1 .  carvedilol (COREG) 12.5 MG tablet, Take 1 tablet (12.5 mg total) by mouth 2 (two) times daily with a meal., Disp: 180 tablet, Rfl: 1 .  ketorolac (TORADOL) 10 MG tablet, Take 1 tablet (10 mg total) by mouth every 6 (six) hours as needed., Disp: 60 tablet, Rfl: 0 .  omeprazole (PRILOSEC) 20 MG capsule, TAKE 1 CAPSULE (20 MG TOTAL) BY MOUTH DAILY., Disp: 90 capsule, Rfl: 1 .  tamsulosin (FLOMAX) 0.4 MG CAPS capsule, Take 1 capsule (0.4 mg total) by mouth daily., Disp: 90 capsule, Rfl: 1 .  aspirin EC 81 MG tablet, Take 81 mg by mouth. Reported on 07/29/2015, Disp: , Rfl:  .  celecoxib (CELEBREX) 200 MG capsule, TAKE 1 CAPSULE (200 MG TOTAL) BY MOUTH DAILY. (Patient not taking: Reported on 07/29/2015), Disp: 90 capsule, Rfl: 1 .  diclofenac sodium (VOLTAREN) 1 % GEL, Apply 4 g topically 4 (four) times daily. (Patient not taking: Reported on 07/29/2015), Disp: 300 g, Rfl: 1 .  Etanercept (ENBREL) 25 MG/0.5ML SOSY, Inject into the skin. Reported on 07/29/2015, Disp: , Rfl:  .  rizatriptan (MAXALT) 10 MG tablet, Take 1 tablet (10 mg total) by mouth as needed for migraine. (Patient not taking: Reported on 04/28/2015), Disp: 10 tablet, Rfl: 0 .  VIAGRA 100 MG tablet, Take 100 mg by mouth daily as needed. Reported on 07/29/2015, Disp: , Rfl: 2  Allergies  Allergen Reactions  . Ciprofloxacin Other (See Comments)    Body aches  . Crestor [Rosuvastatin Calcium] Other (See Comments)    Muscles aches  . Lipofen  [Fenofibrate]     muscle aches muscle aches   . Niacin Er     flushed  . Other Other (See Comments)    Depiridoc - "Stopped my heart"     ROS  Constitutional: Negative for fever, positive for  weight change.  Respiratory: Negative for cough and shortness of breath.   Cardiovascular: Negative for chest pain or palpitations.  Gastrointestinal: Negative for abdominal pain, no bowel changes.  Musculoskeletal: Negative for gait problem or joint swelling.  Skin: Negative for rash.  Neurological: Negative for dizziness, positive for  headache.  No other specific complaints in a complete review of systems (except as listed in HPI above).  Objective  Filed Vitals:  07/29/15 0905  BP: 118/76  Pulse: 92  Temp: 98 F (36.7 C)  TempSrc: Oral  Resp: 16  Height: 5\' 7"  (1.702 m)  Weight: 208 lb 14.4 oz (94.756 kg)  SpO2: 98%    Body mass index is 32.71 kg/(m^2).  Physical Exam  Constitutional: Patient appears well-developed and well-nourished. Obese  No distress.  HEENT: head atraumatic, normocephalic, pupils equal and reactive to light,  neck supple, throat within normal limits Cardiovascular: Normal rate, regular rhythm and normal heart sounds.  No murmur heard. No BLE edema. Pulmonary/Chest: Effort normal and breath sounds normal. No respiratory distress. Abdominal: Soft.  There is no tenderness. Psychiatric: Patient has a normal mood and affect. behavior is normal. Judgment and thought content normal. Muscular Skeletal: decrease rom of left hip, some swelling on right knee  PHQ2/9: Depression screen [facility] 2/9 07/29/2015 04/28/2015 01/01/2015 10/01/2014  Decreased Interest 0 0 0 0  Down, Depressed, Hopeless 0 0 0 0  PHQ - 2 Score 0 0 0 0    Fall Risk: Fall Risk  07/29/2015 04/28/2015 01/01/2015 01/01/2015 10/01/2014  Falls in the past year? No No Yes Yes No  Number falls in past yr: - - 2 or more 2 or more -  Injury with Fall? - - No No -    Functional Status Survey: Is the patient deaf or have difficulty hearing?:  No Does the patient have difficulty seeing, even when wearing glasses/contacts?: No Does the patient have difficulty concentrating, remembering, or making decisions?: No Does the patient have difficulty walking or climbing stairs?: Yes (due to intermittent left hip pain) Does the patient have difficulty dressing or bathing?: No Does the patient have difficulty doing errands alone such as visiting a doctor's office or shopping?: No    Assessment & Plan   1. Diabetes mellitus type 2, diet-controlled ([place])  Continue life style modification  - POCT UA - Microalbumin  2. Dyslipidemia  - Lipid panel  3. Essential hypertension  - Comprehensive metabolic panel  4. Rheumatoid arthritis involving multiple joints ([place])  He is trying to find a Rheumatologist now  5. Obstructive apnea  Explained increase chance of heart attacks and strokes with poor compliance  6. Migraine without aura and responsive to treatment  - rizatriptan (MAXALT) 10 MG tablet; Take 1 tablet (10 mg total) by mouth as needed for migraine.  Dispense: 10 tablet; Refill: 0 - ketorolac (TORADOL) 10 MG tablet; Take 1 tablet (10 mg total) by mouth every 6 (six) hours as needed.  Dispense: 60 tablet; Refill: 0  7. Failure of erection  - VIAGRA 100 MG tablet; Take 100 mg by mouth daily as needed. Reported on 07/29/2015; Refill: 2  8. Primary osteoarthritis involving multiple joints  Continue follow up with Ortho  9. Leukocytosis (leucocytosis)  - CBC with Differential/Platelet  10. Encounter for long-term (current) use of high-risk medication  Check comp panel   11. Mucopurulent chronic bronchitis (HCC)  - albuterol-ipratropium (COMBIVENT) 18-103 MCG/ACT inhaler; Inhale 2 puffs into the lungs every 6 (six) hours as needed for wheezing or shortness of breath.  Dispense: 14.7 g; Refill: 2  12. Gastroesophageal reflux disease without esophagitis  - omeprazole (PRILOSEC) 20 MG capsule; Take 1 capsule (20 mg  total) by mouth daily.  Dispense: 90 capsule; Refill: 1

## 2015-10-29 ENCOUNTER — Encounter: Payer: Self-pay | Admitting: Family Medicine

## 2015-10-29 ENCOUNTER — Other Ambulatory Visit: Payer: Self-pay | Admitting: Family Medicine

## 2015-10-29 ENCOUNTER — Ambulatory Visit (INDEPENDENT_AMBULATORY_CARE_PROVIDER_SITE_OTHER): Payer: 59 | Admitting: Family Medicine

## 2015-10-29 ENCOUNTER — Telehealth: Payer: Self-pay | Admitting: Family Medicine

## 2015-10-29 VITALS — BP 122/84 | HR 88 | Temp 98.1°F | Resp 16 | Ht 67.0 in | Wt 209.6 lb

## 2015-10-29 DIAGNOSIS — N529 Male erectile dysfunction, unspecified: Secondary | ICD-10-CM

## 2015-10-29 DIAGNOSIS — E785 Hyperlipidemia, unspecified: Secondary | ICD-10-CM | POA: Diagnosis not present

## 2015-10-29 DIAGNOSIS — D473 Essential (hemorrhagic) thrombocythemia: Secondary | ICD-10-CM | POA: Diagnosis not present

## 2015-10-29 DIAGNOSIS — I10 Essential (primary) hypertension: Secondary | ICD-10-CM

## 2015-10-29 DIAGNOSIS — G43009 Migraine without aura, not intractable, without status migrainosus: Secondary | ICD-10-CM

## 2015-10-29 DIAGNOSIS — N521 Erectile dysfunction due to diseases classified elsewhere: Secondary | ICD-10-CM

## 2015-10-29 DIAGNOSIS — M069 Rheumatoid arthritis, unspecified: Secondary | ICD-10-CM | POA: Diagnosis not present

## 2015-10-29 DIAGNOSIS — E1149 Type 2 diabetes mellitus with other diabetic neurological complication: Secondary | ICD-10-CM

## 2015-10-29 DIAGNOSIS — Z23 Encounter for immunization: Secondary | ICD-10-CM | POA: Diagnosis not present

## 2015-10-29 DIAGNOSIS — M150 Primary generalized (osteo)arthritis: Secondary | ICD-10-CM

## 2015-10-29 DIAGNOSIS — N400 Enlarged prostate without lower urinary tract symptoms: Secondary | ICD-10-CM | POA: Diagnosis not present

## 2015-10-29 DIAGNOSIS — D72829 Elevated white blood cell count, unspecified: Secondary | ICD-10-CM | POA: Diagnosis not present

## 2015-10-29 DIAGNOSIS — R7989 Other specified abnormal findings of blood chemistry: Secondary | ICD-10-CM

## 2015-10-29 DIAGNOSIS — M159 Polyosteoarthritis, unspecified: Secondary | ICD-10-CM

## 2015-10-29 DIAGNOSIS — G4733 Obstructive sleep apnea (adult) (pediatric): Secondary | ICD-10-CM

## 2015-10-29 DIAGNOSIS — E1140 Type 2 diabetes mellitus with diabetic neuropathy, unspecified: Secondary | ICD-10-CM

## 2015-10-29 LAB — GLUCOSE, POCT (MANUAL RESULT ENTRY): POC GLUCOSE: 140 mg/dL — AB (ref 70–99)

## 2015-10-29 LAB — POCT GLYCOSYLATED HEMOGLOBIN (HGB A1C): HEMOGLOBIN A1C: 5.9

## 2015-10-29 MED ORDER — KETOROLAC TROMETHAMINE 10 MG PO TABS: 10.0000 mg | ORAL_TABLET | Freq: Four times a day (QID) | ORAL | 0 refills | Status: DC | PRN

## 2015-10-29 MED ORDER — CELECOXIB 200 MG PO CAPS: ORAL_CAPSULE | ORAL | 1 refills | Status: DC

## 2015-10-29 MED ORDER — AMLODIPINE BESY-BENAZEPRIL HCL 5-40 MG PO CAPS: 1.0000 | ORAL_CAPSULE | Freq: Every day | ORAL | 1 refills | Status: DC

## 2015-10-29 MED ORDER — DICLOFENAC SODIUM 1 % TD GEL: 4.0000 g | Freq: Four times a day (QID) | TRANSDERMAL | 1 refills | Status: DC

## 2015-10-29 MED ORDER — VIAGRA 100 MG PO TABS: 100.0000 mg | ORAL_TABLET | Freq: Every day | ORAL | 2 refills | Status: DC | PRN

## 2015-10-29 MED ORDER — TAMSULOSIN HCL 0.4 MG PO CAPS: 0.4000 mg | ORAL_CAPSULE | Freq: Every day | ORAL | 1 refills | Status: DC

## 2015-10-29 NOTE — Progress Notes (Signed)
Name: Tim Anderson   MRN: [id number]    DOB: 28-Aug-1965   Date:10/29/2015       Progress Note  Subjective  Chief Complaint  Chief Complaint  Patient presents with  . Diabetes    pt here for 3 month follow up  . Hypertension    HPI   GERD: he is on medication and symptoms are controlled. He states he has heartburn if he skips one dose of medication, he also has intermittent regurgitation. Discussed risk of long term use of PPI.   OSA: he has not been using CPAP machine anymore. He states he stopped about 5 years ago because he was feeling claustrophobic. Discussed increase in risk of MI and strokes  OA: he has OA of multiple joints, he had a right total knee replacement in December and the pain has improved, good rom, will need left hip replacement and left knee replacement in the near future. He has daily left hip pain. Out of Celebrex and we will resume medication   Migraine/headaches: he has dull ache/tension type headache that goes from neck to top of the head almost every other day, he takes Toradol prn for that. He has migraine seldom. He has an episode less than once a month and he takes  Maxalt at home and promethazine prn  He has phonophobia and photophobia with episodes of migraine, sometimes vomiting.  One severe episode over the past 3 weeks  COPD: no longer using Sipiriva, using Combivent prn only, has daily cough, productive in am's and grey in color, he is not ready to quit smoking at this time.   RA: sees Rheumatologist, off Enbrel until he can established with a new provider ( insurance change ), still has daily pain but doing better now. Pain is 5/10, sometimes effusion, he has physical job and is hard to work all day. Wife is going to check what provider is in network and call us back for a referral    HTN: taking medication, no chest pain, no palpitation. Also takes baby aspirin daily, off statin therapy because of muscles aches  DMII: diagnosed Nov 2016,  he is on diet only, due for eye exam. He denies polyphagia or polyuria, but he has polydipsia. HgbA1c is good today. He has ED and takes Viagra prn   Hyperlipidemia: he stopped Lipitor and  Crestor because of muscle aches.   BPH: sees Dr. Jacqlyn Larsen, taking Flomax   Patient Active Problem List   Diagnosis Date Noted  . Primary osteoarthritis involving multiple joints 07/29/2015  . Arthritis of knee, degenerative 02/03/2015  . Diabetes mellitus type 2, diet-controlled ([place]) 01/27/2015  . Benign fibroma of prostate 09/28/2014  . Bronchitis with chronic airway obstruction ([place]) 09/28/2014  . Drug abuse 09/28/2014  . Deflected nasal septum 09/28/2014  . Dyslipidemia 09/28/2014  . Dysmetabolic syndrome [id number]  . GERD (gastroesophageal reflux disease) 09/28/2014  . H/O renal calculi 09/28/2014  . Benign hypertension 09/28/2014  . Male hypogonadism 09/28/2014  . Failure of erection 09/28/2014  . Obstructive apnea 09/28/2014  . Depression with anxiety 09/28/2014  . Obesity (BMI 30-39.9) 09/28/2014  . Migraine without aura and responsive to treatment 09/28/2014  . Perennial allergic rhinitis with seasonal variation 09/28/2014  . Elevated platelet count ([place]) 09/28/2014  . Tobacco abuse 09/28/2014  . Cerebrovascular accident (CVA) ([place]) 07/27/2013  . History of TIA (transient ischemic attack) 09/20/2011  . Rheumatoid arthritis involving multiple joints ([place]) 07/31/2009    Past Surgical History:  Procedure Laterality Date  .  FACIAL RECONSTRUCTION SURGERY    . KNEE SURGERY Right     Family History  Problem Relation Age of Onset  . Diabetes Mother   . Diabetes Father   . Heart attack Father   . Cancer Brother     Skin and Prostate-Oldest Brother  . Diabetes Brother     Social History   Social History  . Marital status: Married    Spouse name: N/A  . Number of children: N/A  . Years of education: N/A   Occupational History  . Not on file.   Social History Main Topics  .  Smoking status: Current Every Day Smoker    Packs/day: 0.75    Years: 38.00    Types: Cigarettes    Start date: 09/30/1976  . Smokeless tobacco: Never Used  . Alcohol use 0.0 oz/week     Comment: occasionally  . Drug use: No  . Sexual activity: Yes    Partners: Female   Other Topics Concern  . Not on file   Social History Narrative  . No narrative on file     Current Outpatient Prescriptions:  .  albuterol-ipratropium (COMBIVENT) 18-103 MCG/ACT inhaler, Inhale 2 puffs into the lungs every 6 (six) hours as needed for wheezing or shortness of breath., Disp: 14.7 g, Rfl: 2 .  amLODipine-benazepril (LOTREL) 5-40 MG capsule, Take 1 capsule by mouth daily., Disp: 90 capsule, Rfl: 1 .  aspirin EC 81 MG tablet, Take 81 mg by mouth. Reported on 07/29/2015, Disp: , Rfl:  .  carvedilol (COREG) 12.5 MG tablet, Take 1 tablet (12.5 mg total) by mouth 2 (two) times daily with a meal., Disp: 180 tablet, Rfl: 1 .  celecoxib (CELEBREX) 200 MG capsule, TAKE 1 CAPSULE (200 MG TOTAL) BY MOUTH DAILY., Disp: 90 capsule, Rfl: 1 .  diclofenac sodium (VOLTAREN) 1 % GEL, Apply 4 g topically 4 (four) times daily., Disp: 300 g, Rfl: 1 .  ketorolac (TORADOL) 10 MG tablet, Take 1 tablet (10 mg total) by mouth every 6 (six) hours as needed., Disp: 60 tablet, Rfl: 0 .  omeprazole (PRILOSEC) 20 MG capsule, Take 1 capsule (20 mg total) by mouth daily., Disp: 90 capsule, Rfl: 1 .  rizatriptan (MAXALT) 10 MG tablet, Take 1 tablet (10 mg total) by mouth as needed for migraine., Disp: 10 tablet, Rfl: 0 .  tamsulosin (FLOMAX) 0.4 MG CAPS capsule, Take 1 capsule (0.4 mg total) by mouth daily., Disp: 90 capsule, Rfl: 1 .  VIAGRA 100 MG tablet, Take 1 tablet (100 mg total) by mouth daily as needed. Reported on 07/29/2015, Disp: 3 tablet, Rfl: 2  Allergies  Allergen Reactions  . Ciprofloxacin Other (See Comments)    Body aches  . Crestor [Rosuvastatin Calcium] Other (See Comments)    Muscles aches  . Lipofen  [Fenofibrate]      muscle aches muscle aches  . Niacin Er     flushed  . Other Other (See Comments)    Depiridoc - "Stopped my heart"     ROS  Constitutional: Negative for fever or weight change.  Respiratory: Positive  for cough but no shortness of breath.   Cardiovascular: Negative for chest pain or palpitations.  Gastrointestinal: Negative for abdominal pain, no bowel changes.  Musculoskeletal: Positive  for gait problem and  joint swelling.  Skin: Negative for rash.  Neurological: Negative for dizziness, positive for intermittent  headache.  No other specific complaints in a complete review of systems (except as listed in HPI above).  Objective  Vitals:   10/29/15 0852  BP: 122/84  Pulse: 88  Resp: 16  Temp: 98.1 F (36.7 C)  SpO2: 94%  Weight: 209 lb 9 oz (95.1 kg)  Height: 5\' 7"  (1.702 m)    Body mass index is 32.82 kg/m.  Physical Exam  Constitutional: Patient appears well-developed and well-nourished. Obese No distress.  HEENT: head atraumatic, normocephalic, pupils equal and reactive to light,  neck supple, throat within normal limits Cardiovascular: Normal rate, regular rhythm and normal heart sounds.  No murmur heard. No BLE edema. Pulmonary/Chest: Effort normal and breath sounds normal. No respiratory distress. Abdominal: Soft.  There is no tenderness. Psychiatric: Patient has a normal mood and affect. behavior is normal. Judgment and thought content normal. Muscular Skeletal: right knee effusion, no redness, large scar, mild synovitis on right hand  Recent Results (from the past 2160 hour(s))  POCT Glucose (CBG)     Status: Abnormal   Collection Time: 10/29/15  9:03 AM  Result Value Ref Range   POC Glucose 140 (A) 70 - 99 mg/dl     PHQ2/9: Depression screen [facility] 2/9 10/29/2015 07/29/2015 04/28/2015 01/01/2015 10/01/2014  Decreased Interest 0 0 0 0 0  Down, Depressed, Hopeless 0 0 0 0 0  PHQ - 2 Score 0 0 0 0 0    Fall Risk: Fall Risk  10/29/2015 07/29/2015  04/28/2015 01/01/2015 01/01/2015  Falls in the past year? No No No Yes Yes  Number falls in past yr: - - - 2 or more 2 or more  Injury with Fall? - - - No No    Functional Status Survey: Is the patient deaf or have difficulty hearing?: No Does the patient have difficulty seeing, even when wearing glasses/contacts?: No Does the patient have difficulty concentrating, remembering, or making decisions?: No Does the patient have difficulty walking or climbing stairs?: Yes Does the patient have difficulty dressing or bathing?: No Does the patient have difficulty doing errands alone such as visiting a doctor's office or shopping?: No    Assessment & Plan  1. Diabetes mellitus type 2, diet-controlled (HCC)  - POCT HgB A1C - POCT Glucose (CBG)  2. Dyslipidemia  Off statin therapy because of pain, discussed we can re-challenge once he goes back on anti-rheumatological agents  3. Essential hypertension  - amLODipine-benazepril (LOTREL) 5-40 MG capsule; Take 1 capsule by mouth daily.  Dispense: 90 capsule; Refill: 1  4. Rheumatoid arthritis involving multiple joints (HCC)  - celecoxib (CELEBREX) 200 MG capsule; TAKE 1 CAPSULE (200 MG TOTAL) BY MOUTH DAILY.  Dispense: 90 capsule; Refill: 1  5. Obstructive apnea  Not compliant   6. Primary osteoarthritis involving multiple joints  - celecoxib (CELEBREX) 200 MG capsule; TAKE 1 CAPSULE (200 MG TOTAL) BY MOUTH DAILY.  Dispense: 90 capsule; Refill: 1 - diclofenac sodium (VOLTAREN) 1 % GEL; Apply 4 g topically 4 (four) times daily.  Dispense: 300 g; Refill: 1  7. Leukocytosis (leucocytosis)  Needs to have CBC done, he still has orders at home  8. Elevated platelet count (HCC)  See above  9. Migraine without aura and responsive to treatment  - ketorolac (TORADOL) 10 MG tablet; Take 1 tablet (10 mg total) by mouth every 6 (six) hours as needed.  Dispense: 60 tablet; Refill: 0  10. Benign fibroma of prostate  - tamsulosin (FLOMAX)  0.4 MG CAPS capsule; Take 1 capsule (0.4 mg total) by mouth daily.  Dispense: 90 capsule; Refill: 1  11. Failure of erection  - VIAGRA  100 MG tablet; Take 1 tablet (100 mg total) by mouth daily as needed. Reported on 07/29/2015  Dispense: 3 tablet; Refill: 2  12. Needs flu shot  - Flu Vaccine QUAD 36+ mos IM

## 2015-10-29 NOTE — Telephone Encounter (Signed)
It was sent in 10/2015

## 2015-10-29 NOTE — Telephone Encounter (Signed)
Patient requesting refill of [facility]rel be sent to [facility].

## 2015-10-29 NOTE — Telephone Encounter (Signed)
Prescription was sent and verified by the pharmacist [facility] in [place].

## 2015-10-30 ENCOUNTER — Other Ambulatory Visit: Payer: Self-pay | Admitting: Family Medicine

## 2015-10-30 DIAGNOSIS — I10 Essential (primary) hypertension: Secondary | ICD-10-CM

## 2015-10-30 MED ORDER — AMLODIPINE BESY-BENAZEPRIL HCL 5-40 MG PO CAPS: 1.0000 | ORAL_CAPSULE | Freq: Every day | ORAL | 1 refills | Status: DC

## 2015-10-30 NOTE — Telephone Encounter (Signed)
Pharmacy states they are not seeing this request for medication, patient did pick up prescription yesterday it was sent in as 30 pill with 1 refill.

## 2015-10-30 NOTE — Telephone Encounter (Signed)
Patient requesting refill of Lotrel.

## 2015-11-04 ENCOUNTER — Other Ambulatory Visit: Payer: Self-pay | Admitting: Family Medicine

## 2015-11-12 NOTE — Telephone Encounter (Signed)
If this has been notated please close encounter. Thank you.

## 2015-11-22 ENCOUNTER — Other Ambulatory Visit: Payer: Self-pay | Admitting: Family Medicine

## 2015-11-22 DIAGNOSIS — I10 Essential (primary) hypertension: Secondary | ICD-10-CM

## 2015-11-27 ENCOUNTER — Telehealth: Payer: Self-pay

## 2015-11-27 NOTE — Telephone Encounter (Signed)
Patient has switched pharmacist due to cost from [facility] to now [facility]. When talking to the pharmacy from [place] they were able to transfer all his medications except the Carvedilol. They to resend it, I called the medication in since Dr. Ancil Boozer just [facility] on 11[facility]/15 with 1 refill. Patient was notified.

## 2015-12-23 LAB — HM DIABETES EYE EXAM

## 2015-12-25 ENCOUNTER — Encounter: Payer: Self-pay | Admitting: Family Medicine

## 2016-01-21 ENCOUNTER — Ambulatory Visit: Payer: 59 | Admitting: Family Medicine

## 2016-01-28 ENCOUNTER — Other Ambulatory Visit: Payer: Self-pay | Admitting: Family Medicine

## 2016-01-28 DIAGNOSIS — I10 Essential (primary) hypertension: Secondary | ICD-10-CM

## 2016-01-28 NOTE — Telephone Encounter (Signed)
Patient requesting refill of Amlodipine-Benazepril to [facility].

## 2016-02-17 ENCOUNTER — Other Ambulatory Visit: Payer: Self-pay | Admitting: Family Medicine

## 2016-02-17 DIAGNOSIS — N400 Enlarged prostate without lower urinary tract symptoms: Secondary | ICD-10-CM

## 2016-02-17 NOTE — Telephone Encounter (Signed)
Patient requesting refill of Tamsulosin to [facility].

## 2016-02-18 ENCOUNTER — Other Ambulatory Visit: Payer: Self-pay | Admitting: Family Medicine

## 2016-02-18 DIAGNOSIS — M069 Rheumatoid arthritis, unspecified: Secondary | ICD-10-CM

## 2016-02-18 DIAGNOSIS — K219 Gastro-esophageal reflux disease without esophagitis: Secondary | ICD-10-CM

## 2016-02-18 DIAGNOSIS — M159 Polyosteoarthritis, unspecified: Secondary | ICD-10-CM

## 2016-02-18 DIAGNOSIS — M150 Primary generalized (osteo)arthritis: Secondary | ICD-10-CM

## 2016-02-18 NOTE — Telephone Encounter (Signed)
Got a request from [facility] for Tamsulosin 0.4mg  but it has already been sent in on 02/17/16.

## 2016-02-18 NOTE — Telephone Encounter (Signed)
Patient requesting refill of Celebrex and Prilosec to [facility].

## 2016-02-19 NOTE — Telephone Encounter (Signed)
Appointment made for 03/16/16

## 2016-03-16 ENCOUNTER — Ambulatory Visit: Payer: 59 | Admitting: Family Medicine

## 2016-03-16 ENCOUNTER — Other Ambulatory Visit: Payer: Self-pay | Admitting: Family Medicine

## 2016-03-16 DIAGNOSIS — K219 Gastro-esophageal reflux disease without esophagitis: Secondary | ICD-10-CM

## 2016-03-16 DIAGNOSIS — M159 Polyosteoarthritis, unspecified: Secondary | ICD-10-CM

## 2016-03-16 DIAGNOSIS — M069 Rheumatoid arthritis, unspecified: Secondary | ICD-10-CM

## 2016-03-16 DIAGNOSIS — N400 Enlarged prostate without lower urinary tract symptoms: Secondary | ICD-10-CM

## 2016-03-16 DIAGNOSIS — M150 Primary generalized (osteo)arthritis: Secondary | ICD-10-CM

## 2016-03-29 ENCOUNTER — Telehealth: Payer: Self-pay | Admitting: Family Medicine

## 2016-03-29 DIAGNOSIS — I10 Essential (primary) hypertension: Secondary | ICD-10-CM

## 2016-03-29 NOTE — Telephone Encounter (Signed)
Patient requesting refill of Lotrel to [facility].  

## 2016-03-30 NOTE — Telephone Encounter (Signed)
Appointment made for 04-20-16

## 2016-04-02 ENCOUNTER — Ambulatory Visit: Payer: 59 | Admitting: Family Medicine

## 2016-04-07 ENCOUNTER — Ambulatory Visit (INDEPENDENT_AMBULATORY_CARE_PROVIDER_SITE_OTHER): Payer: 59 | Admitting: Family Medicine

## 2016-04-07 ENCOUNTER — Encounter: Payer: Self-pay | Admitting: Family Medicine

## 2016-04-07 VITALS — BP 154/92 | HR 92 | Temp 97.9°F | Resp 18 | Ht 67.0 in | Wt 218.3 lb

## 2016-04-07 DIAGNOSIS — M069 Rheumatoid arthritis, unspecified: Secondary | ICD-10-CM

## 2016-04-07 DIAGNOSIS — K219 Gastro-esophageal reflux disease without esophagitis: Secondary | ICD-10-CM | POA: Diagnosis not present

## 2016-04-07 DIAGNOSIS — I10 Essential (primary) hypertension: Secondary | ICD-10-CM | POA: Diagnosis not present

## 2016-04-07 DIAGNOSIS — M159 Polyosteoarthritis, unspecified: Secondary | ICD-10-CM

## 2016-04-07 DIAGNOSIS — R7989 Other specified abnormal findings of blood chemistry: Secondary | ICD-10-CM

## 2016-04-07 DIAGNOSIS — E1169 Type 2 diabetes mellitus with other specified complication: Secondary | ICD-10-CM | POA: Diagnosis not present

## 2016-04-07 DIAGNOSIS — G5603 Carpal tunnel syndrome, bilateral upper limbs: Secondary | ICD-10-CM

## 2016-04-07 DIAGNOSIS — E119 Type 2 diabetes mellitus without complications: Secondary | ICD-10-CM

## 2016-04-07 DIAGNOSIS — D473 Essential (hemorrhagic) thrombocythemia: Secondary | ICD-10-CM | POA: Diagnosis not present

## 2016-04-07 DIAGNOSIS — J411 Mucopurulent chronic bronchitis: Secondary | ICD-10-CM

## 2016-04-07 DIAGNOSIS — M150 Primary generalized (osteo)arthritis: Secondary | ICD-10-CM | POA: Diagnosis not present

## 2016-04-07 DIAGNOSIS — G43009 Migraine without aura, not intractable, without status migrainosus: Secondary | ICD-10-CM | POA: Diagnosis not present

## 2016-04-07 DIAGNOSIS — G4733 Obstructive sleep apnea (adult) (pediatric): Secondary | ICD-10-CM | POA: Diagnosis not present

## 2016-04-07 DIAGNOSIS — R079 Chest pain, unspecified: Secondary | ICD-10-CM | POA: Diagnosis not present

## 2016-04-07 DIAGNOSIS — E785 Hyperlipidemia, unspecified: Secondary | ICD-10-CM

## 2016-04-07 LAB — POCT GLYCOSYLATED HEMOGLOBIN (HGB A1C): HEMOGLOBIN A1C: 6

## 2016-04-07 MED ORDER — AMLODIPINE BESY-BENAZEPRIL HCL 5-40 MG PO CAPS: 1.0000 | ORAL_CAPSULE | Freq: Every day | ORAL | 1 refills | Status: DC

## 2016-04-07 MED ORDER — RIZATRIPTAN BENZOATE 10 MG PO TABS: 10.0000 mg | ORAL_TABLET | ORAL | 0 refills | Status: DC | PRN

## 2016-04-07 MED ORDER — OMEPRAZOLE 20 MG PO CPDR: 20.0000 mg | DELAYED_RELEASE_CAPSULE | Freq: Every day | ORAL | 1 refills | Status: DC

## 2016-04-07 MED ORDER — CARVEDILOL 12.5 MG PO TABS: 12.5000 mg | ORAL_TABLET | Freq: Two times a day (BID) | ORAL | 1 refills | Status: DC

## 2016-04-07 MED ORDER — CELECOXIB 200 MG PO CAPS: 200.0000 mg | ORAL_CAPSULE | Freq: Every day | ORAL | 1 refills | Status: DC

## 2016-04-07 NOTE — Progress Notes (Signed)
Name: Tim Anderson   MRN: [id number]    DOB: November 26, 1965   Date:04/07/2016       Progress Note  Subjective  Chief Complaint  Chief Complaint  Patient presents with  . Medication Refill  . Hip Pain    Left Hip pain that radiates down his leg  . Hypertension    BP has been elevated at home and having a constant headaches   . Migraine    Has had 2 in the past 2 weeks   . Gastroesophageal Reflux    Well controlled with daily usage.     HPI  Chest pain: he left side chest pain, that can last 20 minutes during rest or activity, described as aching, no nausea or diaphoresis, he has orthopnea but that has been going on for months.  GERD: he is on medication and symptoms are controlled. He states he has heartburn if he skips multiple dose of medication, he also has intermittent regurgitation. Discussed risk of long term use of PPI.   OSA: he has not been using CPAP machine anymore. He states he stopped about 5 years ago because he was feeling claustrophobic. Discussed increase in risk of MI and strokes, explained that there are new masks.  OA: he has OA of multiple joints, he had a right total knee replacement in December 2016 and the pain has improved, good rom, will need left hip replacement and left knee replacement in the near future, pain is getting worse on left hip. He has daily left hip pain. He states Celebrex helps him be able to work.   Migraine/headaches: he has dull ache/tension type headache dull frontal headache almost daily  he takes Toradol prn for that. He has migraine seldom, nuchal and associated with nausea and vomiting, a few episodes per month.He has phonophobia and photophobia with episodes of migraine, sometimes vomiting.   COPD: no longer using Sipiriva, using Combivent prn only, has daily cough, productive in am's and grey in color, he is not ready to quit smoking at this time. SOB only with moderate activity  RA: sees Rheumatologist, off Enbrel until he  can established with a new provider ( insurance change ), but on Plaquenil now still has daily pain but doing better now, except for left hip pain, sometimes effusion, he has physical job and is hard to work all day.   HTN: out of bp  medication, no chest pain, no palpitation. Off statin therapy because of muscles aches.  DMII: diagnosed Nov 2016, he is on diet only. He denies polyphagia or polyuria, but he has polydipsia. HgbA1c is good today. He has ED and takes Viagra prn   Hyperlipidemia: he stopped Lipitor and Crestor because of muscle aches. He has low HDL   BPH: sees Dr. Jacqlyn Larsen, taking Flomax    Patient Active Problem List   Diagnosis Date Noted  . Primary osteoarthritis involving multiple joints 07/29/2015  . Arthritis of knee, degenerative 02/03/2015  . Diabetes mellitus type 2, diet-controlled ([place]) 01/27/2015  . Benign fibroma of prostate 09/28/2014  . Bronchitis with chronic airway obstruction ([place]) 09/28/2014  . Drug abuse 09/28/2014  . Deflected nasal septum 09/28/2014  . Dyslipidemia 09/28/2014  . Dysmetabolic syndrome [id number]  . GERD (gastroesophageal reflux disease) 09/28/2014  . H/O renal calculi 09/28/2014  . Benign hypertension 09/28/2014  . Male hypogonadism 09/28/2014  . Failure of erection 09/28/2014  . Obstructive apnea 09/28/2014  . Depression with anxiety 09/28/2014  . Obesity (BMI 30-39.9) 09/28/2014  . Migraine  without aura and responsive to treatment 09/28/2014  . Perennial allergic rhinitis with seasonal variation 09/28/2014  . Elevated platelet count ([place]) 09/28/2014  . Tobacco abuse 09/28/2014  . Cerebrovascular accident (CVA) ([place]) 07/27/2013  . History of TIA (transient ischemic attack) 09/20/2011  . Rheumatoid arthritis involving multiple joints ([place]) 07/31/2009    Past Surgical History:  Procedure Laterality Date  . FACIAL RECONSTRUCTION SURGERY    . KNEE SURGERY Right     Family History  Problem Relation Age of Onset  .  Diabetes Mother   . Diabetes Father   . Heart attack Father   . Cancer Brother     Skin and Prostate-Oldest Brother  . Diabetes Brother     Social History   Social History  . Marital status: Married    Spouse name: N/A  . Number of children: N/A  . Years of education: N/A   Occupational History  . Not on file.   Social History Main Topics  . Smoking status: Current Every Day Smoker    Packs/day: 0.75    Years: 38.00    Types: Cigarettes    Start date: 09/30/1976  . Smokeless tobacco: Never Used  . Alcohol use 0.0 oz/week     Comment: occasionally  . Drug use: No  . Sexual activity: Yes    Partners: Female   Other Topics Concern  . Not on file   Social History Narrative  . No narrative on file     Current Outpatient Prescriptions:  .  albuterol-ipratropium (COMBIVENT) 18-103 MCG/ACT inhaler, Inhale 2 puffs into the lungs every 6 (six) hours as needed for wheezing or shortness of breath., Disp: 14.7 g, Rfl: 2 .  amLODipine-benazepril (LOTREL) 5-40 MG capsule, Take 1 capsule by mouth daily., Disp: 90 capsule, Rfl: 1 .  aspirin EC 81 MG tablet, Take 81 mg by mouth. Reported on 07/29/2015, Disp: , Rfl:  .  carvedilol (COREG) 12.5 MG tablet, Take 1 tablet (12.5 mg total) by mouth 2 (two) times daily with a meal., Disp: 180 tablet, Rfl: 1 .  celecoxib (CELEBREX) 200 MG capsule, Take 1 capsule (200 mg total) by mouth daily., Disp: 90 capsule, Rfl: 1 .  diclofenac sodium (VOLTAREN) 1 % GEL, Apply 4 g topically 4 (four) times daily., Disp: 300 g, Rfl: 1 .  hydroxychloroquine (PLAQUENIL) 200 MG tablet, Take 1 tablet by mouth daily., Disp: , Rfl:  .  ketorolac (TORADOL) 10 MG tablet, Take 1 tablet (10 mg total) by mouth every 6 (six) hours as needed., Disp: 60 tablet, Rfl: 0 .  omeprazole (PRILOSEC) 20 MG capsule, Take 1 capsule (20 mg total) by mouth daily., Disp: 90 capsule, Rfl: 1 .  rizatriptan (MAXALT) 10 MG tablet, Take 1 tablet (10 mg total) by mouth as needed for  migraine., Disp: 10 tablet, Rfl: 0 .  tamsulosin (FLOMAX) 0.4 MG CAPS capsule, TAKE ONE CAPSULE DAILY., Disp: 30 capsule, Rfl: 0 .  VIAGRA 100 MG tablet, TAKE 1 TABLET DAILY AS NEEDED, Disp: 6 tablet, Rfl: 2  Allergies  Allergen Reactions  . Ciprofloxacin Other (See Comments)    Body aches  . Crestor [Rosuvastatin Calcium] Other (See Comments)    Muscles aches  . Lipofen  [Fenofibrate]     muscle aches muscle aches  . Niacin Er     flushed  . Other Other (See Comments)    Depiridoc - "Stopped my heart"     ROS  Constitutional: Negative for fever, positive for weight change.  Respiratory: Negative  for cough and shortness of breath.   Cardiovascular: Negative for chest pain or palpitations.  Gastrointestinal: Negative for abdominal pain, no bowel changes.  Musculoskeletal: Positive  for gait problem and  joint swelling.  Skin: Negative for rash.  Neurological: Negative for dizziness, intermittent  headache.  No other specific complaints in a complete review of systems (except as listed in HPI above).  Objective  Vitals:   04/07/16 1451  BP: (!) 154/92  Pulse: 92  Resp: 18  Temp: 97.9 F (36.6 C)  TempSrc: Oral  SpO2: 95%  Weight: 218 lb 4.8 oz (99 kg)  Height: 5\' 7"  (1.702 m)    Body mass index is 34.19 kg/m.  Physical Exam  Constitutional: Patient appears well-developed and well-nourished. Obese  No distress.  HEENT: head atraumatic, normocephalic, pupils equal and reactive to light, neck supple, throat within normal limits Cardiovascular: Normal rate, regular rhythm and normal heart sounds.  No murmur heard. No BLE edema. Pulmonary/Chest: Effort normal and breath sounds normal. No respiratory distress. Abdominal: Soft.  There is no tenderness. Psychiatric: Patient has a normal mood and affect. behavior is normal. Judgment and thought content normal.  Recent Results (from the past 2160 hour(s))  POCT HgB A1C     Status: None   Collection Time: 04/07/16  2:56  PM  Result Value Ref Range   Hemoglobin A1C 6.0      PHQ2/9: Depression screen [facility] 2/9 04/07/2016 10/29/2015 07/29/2015 04/28/2015 01/01/2015  Decreased Interest 0 0 0 0 0  Down, Depressed, Hopeless 0 0 0 0 0  PHQ - 2 Score 0 0 0 0 0     Fall Risk: Fall Risk  04/07/2016 10/29/2015 07/29/2015 04/28/2015 01/01/2015  Falls in the past year? No No No No Yes  Number falls in past yr: - - - - 2 or more  Injury with Fall? - - - - No     Functional Status Survey: Is the patient deaf or have difficulty hearing?: No Does the patient have difficulty seeing, even when wearing glasses/contacts?: No Does the patient have difficulty concentrating, remembering, or making decisions?: No Does the patient have difficulty walking or climbing stairs?: No Does the patient have difficulty dressing or bathing?: No Does the patient have difficulty doing errands alone such as visiting a doctor's office or shopping?: No   Assessment & Plan  1. Diabetes mellitus type 2, diet-controlled ([place])  Well controlled with life style modification  - POCT HgB A1C  2. Primary osteoarthritis involving multiple joints  - celecoxib (CELEBREX) 200 MG capsule; Take 1 capsule (200 mg total) by mouth daily.  Dispense: 90 capsule; Refill: 1  3. Dyslipidemia associated with type 2 diabetes mellitus (HCC)  Lipid panel shows low HDL : to improve HDL patient  needs to eat tree nuts ( pecans/pistachios/almonds ) four times weekly, eat fish two times weekly  and exercise  at least 150 minutes per week - Lipid panel  4. Obstructive apnea  Not compliant with CPAP for years, he is aware that not treating OSA increases chances of heart attack and strokes  5. Rheumatoid arthritis involving multiple joints (HCC)  - celecoxib (CELEBREX) 200 MG capsule; Take 1 capsule (200 mg total) by mouth daily.  Dispense: 90 capsule; Refill: 1  6. Migraine without aura and responsive to treatment  - rizatriptan (MAXALT) 10 MG tablet; Take 1  tablet (10 mg total) by mouth as needed for migraine.  Dispense: 10 tablet; Refill: 0  7. Elevated platelet count (HCC)  -  CBC with Differential/Platelet  8. Gastroesophageal reflux disease without esophagitis  - omeprazole (PRILOSEC) 20 MG capsule; Take 1 capsule (20 mg total) by mouth daily.  Dispense: 90 capsule; Refill: 1  9. Mucopurulent chronic bronchitis (HCC)  Still smoking, uses Combivent prn   10. Essential hypertension  - amLODipine-benazepril (LOTREL) 5-40 MG capsule; Take 1 capsule by mouth daily.  Dispense: 90 capsule; Refill: 1 - carvedilol (COREG) 12.5 MG tablet; Take 1 tablet (12.5 mg total) by mouth 2 (two) times daily with a meal.  Dispense: 180 tablet; Refill: 1 - COMPLETE METABOLIC PANEL WITH GFR  11. Carpal tunnel syndrome on both sides  He wants to hold off on surgery  12. Chest pain, unspecified type  - Ambulatory referral to Cardiology Going on for about one month, advised to go to [facility] if symptoms returns prior to evaluation of cardiologist.

## 2016-04-15 ENCOUNTER — Encounter: Payer: Self-pay | Admitting: *Deleted

## 2016-04-20 ENCOUNTER — Ambulatory Visit: Payer: 59 | Admitting: Family Medicine

## 2016-06-01 ENCOUNTER — Other Ambulatory Visit: Payer: Self-pay | Admitting: Cardiology

## 2016-06-01 DIAGNOSIS — R079 Chest pain, unspecified: Secondary | ICD-10-CM

## 2016-06-02 ENCOUNTER — Other Ambulatory Visit: Payer: Self-pay | Admitting: Family Medicine

## 2016-06-02 DIAGNOSIS — M150 Primary generalized (osteo)arthritis: Secondary | ICD-10-CM

## 2016-06-02 DIAGNOSIS — I10 Essential (primary) hypertension: Secondary | ICD-10-CM

## 2016-06-02 DIAGNOSIS — K219 Gastro-esophageal reflux disease without esophagitis: Secondary | ICD-10-CM

## 2016-06-02 DIAGNOSIS — M069 Rheumatoid arthritis, unspecified: Secondary | ICD-10-CM

## 2016-06-02 DIAGNOSIS — M159 Polyosteoarthritis, unspecified: Secondary | ICD-10-CM

## 2016-06-07 ENCOUNTER — Ambulatory Visit: Payer: No Typology Code available for payment source

## 2016-06-25 ENCOUNTER — Other Ambulatory Visit: Payer: Self-pay | Admitting: Family Medicine

## 2016-06-25 DIAGNOSIS — K219 Gastro-esophageal reflux disease without esophagitis: Secondary | ICD-10-CM

## 2016-06-25 NOTE — Telephone Encounter (Signed)
Patient requesting refill of Omeprazole to [facility]. 

## 2016-06-29 ENCOUNTER — Other Ambulatory Visit: Payer: Self-pay | Admitting: Family Medicine

## 2016-06-29 DIAGNOSIS — M159 Polyosteoarthritis, unspecified: Secondary | ICD-10-CM

## 2016-06-29 DIAGNOSIS — M069 Rheumatoid arthritis, unspecified: Secondary | ICD-10-CM

## 2016-06-29 DIAGNOSIS — I10 Essential (primary) hypertension: Secondary | ICD-10-CM

## 2016-06-29 DIAGNOSIS — M150 Primary generalized (osteo)arthritis: Secondary | ICD-10-CM

## 2016-06-29 DIAGNOSIS — K219 Gastro-esophageal reflux disease without esophagitis: Secondary | ICD-10-CM

## 2016-06-29 NOTE — Telephone Encounter (Signed)
Patient requesting refill of Omeprazole to [facility]. 

## 2016-06-30 ENCOUNTER — Other Ambulatory Visit: Payer: Self-pay | Admitting: Family Medicine

## 2016-06-30 DIAGNOSIS — I10 Essential (primary) hypertension: Secondary | ICD-10-CM

## 2016-06-30 NOTE — Telephone Encounter (Signed)
Patient requesting refill of Lotrel to [facility].

## 2016-07-05 ENCOUNTER — Encounter: Payer: Self-pay | Admitting: Family Medicine

## 2016-07-05 ENCOUNTER — Ambulatory Visit (INDEPENDENT_AMBULATORY_CARE_PROVIDER_SITE_OTHER): Payer: BLUE CROSS/BLUE SHIELD | Admitting: Family Medicine

## 2016-07-05 ENCOUNTER — Other Ambulatory Visit: Payer: Self-pay | Admitting: Family Medicine

## 2016-07-05 VITALS — BP 124/78 | HR 74 | Temp 97.9°F | Resp 16 | Ht 67.0 in | Wt 214.3 lb

## 2016-07-05 DIAGNOSIS — R7989 Other specified abnormal findings of blood chemistry: Secondary | ICD-10-CM

## 2016-07-05 DIAGNOSIS — E1169 Type 2 diabetes mellitus with other specified complication: Secondary | ICD-10-CM | POA: Diagnosis not present

## 2016-07-05 DIAGNOSIS — M069 Rheumatoid arthritis, unspecified: Secondary | ICD-10-CM | POA: Diagnosis not present

## 2016-07-05 DIAGNOSIS — E119 Type 2 diabetes mellitus without complications: Secondary | ICD-10-CM

## 2016-07-05 DIAGNOSIS — E785 Hyperlipidemia, unspecified: Secondary | ICD-10-CM | POA: Diagnosis not present

## 2016-07-05 DIAGNOSIS — Z23 Encounter for immunization: Secondary | ICD-10-CM

## 2016-07-05 DIAGNOSIS — J411 Mucopurulent chronic bronchitis: Secondary | ICD-10-CM

## 2016-07-05 DIAGNOSIS — I10 Essential (primary) hypertension: Secondary | ICD-10-CM | POA: Diagnosis not present

## 2016-07-05 DIAGNOSIS — G4733 Obstructive sleep apnea (adult) (pediatric): Secondary | ICD-10-CM | POA: Diagnosis not present

## 2016-07-05 DIAGNOSIS — G43009 Migraine without aura, not intractable, without status migrainosus: Secondary | ICD-10-CM | POA: Diagnosis not present

## 2016-07-05 DIAGNOSIS — B370 Candidal stomatitis: Secondary | ICD-10-CM | POA: Diagnosis not present

## 2016-07-05 LAB — POCT UA - MICROALBUMIN: MICROALBUMIN (UR) POC: 20 mg/L

## 2016-07-05 LAB — POCT GLYCOSYLATED HEMOGLOBIN (HGB A1C): Hemoglobin A1C: 6

## 2016-07-05 MED ORDER — AMLODIPINE BESY-BENAZEPRIL HCL 5-40 MG PO CAPS: 1.0000 | ORAL_CAPSULE | Freq: Every day | ORAL | 1 refills | Status: DC

## 2016-07-05 MED ORDER — KETOROLAC TROMETHAMINE 10 MG PO TABS: 10.0000 mg | ORAL_TABLET | Freq: Four times a day (QID) | ORAL | 0 refills | Status: DC | PRN

## 2016-07-05 MED ORDER — RIZATRIPTAN BENZOATE 10 MG PO TABS: 10.0000 mg | ORAL_TABLET | ORAL | 0 refills | Status: DC | PRN

## 2016-07-05 MED ORDER — PROMETHAZINE HCL 12.5 MG PO TABS: 12.5000 mg | ORAL_TABLET | Freq: Three times a day (TID) | ORAL | 0 refills | Status: DC | PRN

## 2016-07-05 MED ORDER — CARVEDILOL 12.5 MG PO TABS: 12.5000 mg | ORAL_TABLET | Freq: Two times a day (BID) | ORAL | 1 refills | Status: DC

## 2016-07-05 MED ORDER — FLUCONAZOLE 150 MG PO TABS: 150.0000 mg | ORAL_TABLET | ORAL | 0 refills | Status: DC

## 2016-07-05 MED ORDER — FLUTICASONE-UMECLIDIN-VILANT 100-62.5-25 MCG/INH IN AEPB: 1.0000 | INHALATION_SPRAY | Freq: Every day | RESPIRATORY_TRACT | 0 refills | Status: DC

## 2016-07-05 NOTE — Progress Notes (Signed)
Name: Tim Anderson   MRN: [id number]    DOB: 09/09/1965   Date:07/05/2016       Progress Note  Subjective  Chief Complaint  Chief Complaint  Patient presents with  . Diabetes    3 month follow up pt not checking   . Hyperlipidemia  . Sleep Apnea  . Gastroesophageal Reflux    HPI  Chest pain: he left side chest pain, that can last 20 minutes during rest or activity, described as aching, no nausea or diaphoresis, he has orthopnea but that has been going on for months, he saw Dr. Ubaldo Glassing, he was given NTG and scheduled to have a stress test, but had a lapse in insurance, he will call back and re-schedule test.   GERD: he is on medication and symptoms are controlled. He states he has heartburn if he skips multiple dose of medication, he also has intermittent regurgitation. Discussed risk of long term use of PPI.   OSA: he has not been using CPAP machine anymore. He states he stopped about 5years ago because he was feeling claustrophobic. Discussed increase in risk of MI and strokes, explained that there are new masks.  OA: he has OA of multiple joints, he had a right total knee replacement in December 2016 and the pain has improved, good rom, will need left hip replacement and left knee replacement in the near future, recently had shot on his left hip. He states Celebrex helps him be able to work.   Migraine/headaches: he has dull ache/tension type headache dull frontal headache almost daily  he takes Toradol prn for that. He has migraine seldom, nuchal and associated with nausea and vomiting, a few episodes per month.He has phonophobia and photophobia with episodes of migraine, sometimes vomiting. He needs refill of medications Morning headache may be secondary to lack of compliance with CPAP.  COPD: no longer using Sipiriva, using Combiventprn only, has daily cough, that has bee worse over the past week or so. Productive and is green in color. No fever  RA: sees  Rheumatologist, off Enbrel until he can established with a new provider ( insurance change ), but on Plaquenil now still has daily pain but doing better now. He is having left hand pain lately   HTN: out of bp  medication, no chest pain, no palpitation. Off statin therapy because of muscles aches.  DMII: diagnosed Nov 2016, he is on diet only. He denies polyphagia or polyuria, but he has polydipsia. HgbA1c is good today. He has ED and takes Viagra prn   Hyperlipidemia: he stopped Lipitor and Crestor because of muscle aches. He has low HDL   BPH: sees Dr. Jacqlyn Larsen, taking Flomax   Patient Active Problem List   Diagnosis Date Noted  . Carpal tunnel syndrome on both sides 04/07/2016  . Primary osteoarthritis involving multiple joints 07/29/2015  . Arthritis of knee, degenerative 02/03/2015  . Diabetes mellitus type 2, diet-controlled ([place]) 01/27/2015  . Benign fibroma of prostate 09/28/2014  . Bronchitis with chronic airway obstruction ([place]) 09/28/2014  . Drug abuse 09/28/2014  . Deflected nasal septum 09/28/2014  . Dyslipidemia 09/28/2014  . Dysmetabolic syndrome 60/11/9321  . GERD (gastroesophageal reflux disease) 09/28/2014  . H/O renal calculi 09/28/2014  . Benign hypertension 09/28/2014  . Male hypogonadism 09/28/2014  . Failure of erection 09/28/2014  . Obstructive apnea 09/28/2014  . Depression with anxiety 09/28/2014  . Obesity (BMI 30-39.9) 09/28/2014  . Migraine without aura and responsive to treatment 09/28/2014  . Perennial allergic  rhinitis with seasonal variation 09/28/2014  . Elevated platelet count 09/28/2014  . Tobacco abuse 09/28/2014  . Cerebrovascular accident (CVA) ([place]) 07/27/2013  . History of TIA (transient ischemic attack) 09/20/2011  . Rheumatoid arthritis involving multiple joints ([place]) 07/31/2009    Past Surgical History:  Procedure Laterality Date  . FACIAL RECONSTRUCTION SURGERY    . KNEE SURGERY Right     Family History  Problem Relation Age  of Onset  . Diabetes Mother   . Diabetes Father   . Heart attack Father   . Cancer Brother        Skin and Prostate-Oldest Brother  . Diabetes Brother     Social History   Social History  . Marital status: Married    Spouse name: N/A  . Number of children: N/A  . Years of education: N/A   Occupational History  . Not on file.   Social History Main Topics  . Smoking status: Current Every Day Smoker    Packs/day: 0.75    Years: 38.00    Types: Cigarettes    Start date: 09/30/1976  . Smokeless tobacco: Never Used  . Alcohol use 0.0 oz/week     Comment: occasionally  . Drug use: No  . Sexual activity: Yes    Partners: Female   Other Topics Concern  . Not on file   Social History Narrative  . No narrative on file     Current Outpatient Prescriptions:  .  nitroGLYCERIN (NITROSTAT) 0.4 MG SL tablet, Place under the tongue., Disp: , Rfl:  .  sildenafil (REVATIO) 20 MG tablet, 3-5 tablets po daily as needed, Disp: , Rfl:  .  albuterol-ipratropium (COMBIVENT) 18-103 MCG/ACT inhaler, Inhale 2 puffs into the lungs every 6 (six) hours as needed for wheezing or shortness of breath., Disp: 14.7 g, Rfl: 2 .  amLODipine-benazepril (LOTREL) 5-40 MG capsule, Take 1 capsule by mouth daily., Disp: 90 capsule, Rfl: 1 .  aspirin EC 81 MG tablet, Take 81 mg by mouth. Reported on 07/29/2015, Disp: , Rfl:  .  carvedilol (COREG) 12.5 MG tablet, Take 1 tablet (12.5 mg total) by mouth 2 (two) times daily with a meal., Disp: 180 tablet, Rfl: 1 .  celecoxib (CELEBREX) 200 MG capsule, Take 1 capsule (200 mg total) by mouth daily., Disp: 30 capsule, Rfl: 0 .  diclofenac sodium (VOLTAREN) 1 % GEL, Apply 4 g topically 4 (four) times daily., Disp: 300 g, Rfl: 1 .  fluconazole (DIFLUCAN) 150 MG tablet, Take 1 tablet (150 mg total) by mouth every other day., Disp: 3 tablet, Rfl: 0 .  Fluticasone-Umeclidin-Vilant (TRELEGY ELLIPTA) 100-62.5-25 MCG/INH AEPB, Inhale 1 each into the lungs daily., Disp: 28 each,  Rfl: 0 .  hydroxychloroquine (PLAQUENIL) 200 MG tablet, Take 1 tablet by mouth daily., Disp: , Rfl:  .  ketorolac (TORADOL) 10 MG tablet, Take 1 tablet (10 mg total) by mouth every 6 (six) hours as needed., Disp: 60 tablet, Rfl: 0 .  omeprazole (PRILOSEC) 20 MG capsule, Take 1 capsule (20 mg total) by mouth daily., Disp: 30 capsule, Rfl: 0 .  promethazine (PHENERGAN) 12.5 MG tablet, Take 1 tablet (12.5 mg total) by mouth every 8 (eight) hours as needed for nausea or vomiting., Disp: 20 tablet, Rfl: 0 .  rizatriptan (MAXALT) 10 MG tablet, Take 1 tablet (10 mg total) by mouth as needed for migraine., Disp: 10 tablet, Rfl: 0 .  tamsulosin (FLOMAX) 0.4 MG CAPS capsule, TAKE ONE CAPSULE DAILY., Disp: 30 capsule, Rfl: 0  Allergies  Allergen Reactions  . Ciprofloxacin Other (See Comments)    Body aches  . Crestor [Rosuvastatin Calcium] Other (See Comments)    Muscles aches  . Lipofen  [Fenofibrate]     muscle aches muscle aches  . Niacin Er     flushed  . Other Other (See Comments)    Depiridoc - "Stopped my heart"     ROS  Constitutional: Negative for fever or weight change.  Respiratory: Negative for cough and shortness of breath.   Cardiovascular: Negative for chest pain or palpitations.  Gastrointestinal: Negative for abdominal pain, no bowel changes.  Musculoskeletal: Negative for gait problem or joint swelling.  Skin: Negative for rash.  Neurological: Negative for dizziness or headache.  No other specific complaints in a complete review of systems (except as listed in HPI above).  Objective  Vitals:   07/05/16 1043  BP: 124/78  Pulse: 74  Resp: 16  Temp: 97.9 F (36.6 C)  SpO2: 98%  Weight: 214 lb 5 oz (97.2 kg)  Height: 5\' 7"  (1.702 m)    Body mass index is 33.57 kg/m.  Physical Exam  Constitutional: Patient appears well-developed and well-nourished. Obese  No distress.  HEENT: head atraumatic, normocephalic, pupils equal and reactive to light, neck supple,  throat within normal limits, tongue has white coat over it Cardiovascular: Normal rate, regular rhythm and normal heart sounds.  No murmur heard. No BLE edema. Pulmonary/Chest: Effort normal and breath sounds normal. No respiratory distress. Abdominal: Soft.  There is no tenderness. Muscular Skeletal: rom of right knee is over 110 degrees flexion and 180 extension, he has some signs of synovitis on left hand Psychiatric: Patient has a normal mood and affect. behavior is normal. Judgment and thought content normal.  Recent Results (from the past 2160 hour(s))  POCT HgB A1C     Status: None   Collection Time: 04/07/16  2:56 PM  Result Value Ref Range   Hemoglobin A1C 6.0   POCT HgB A1C     Status: Normal   Collection Time: 07/05/16 10:53 AM  Result Value Ref Range   Hemoglobin A1C 6.0     Diabetic Foot Exam: Diabetic Foot Exam - Simple   Simple Foot Form Diabetic Foot exam was performed with the following findings:  Yes 07/05/2016 11:42 AM  Visual Inspection No deformities, no ulcerations, no other skin breakdown bilaterally:  Yes Sensation Testing Intact to touch and monofilament testing bilaterally:  Yes Pulse Check Posterior Tibialis and Dorsalis pulse intact bilaterally:  Yes Comments      PHQ2/9: Depression screen [facility] 2/9 04/07/2016 10/29/2015 07/29/2015 04/28/2015 01/01/2015  Decreased Interest 0 0 0 0 0  Down, Depressed, Hopeless 0 0 0 0 0  PHQ - 2 Score 0 0 0 0 0     Fall Risk: Fall Risk  04/07/2016 10/29/2015 07/29/2015 04/28/2015 01/01/2015  Falls in the past year? No No No No Yes  Number falls in past yr: - - - - 2 or more  Injury with Fall? - - - - No     Assessment & Plan  1. Diabetes mellitus type 2, diet-controlled ([place])  - POCT HgB A1C - POCT UA - Microalbumin  2. Dyslipidemia associated with type 2 diabetes mellitus ([place])  He refuses statin because of side effets  3. Obstructive apnea  Not wearing CPAP   4. Rheumatoid arthritis involving multiple  joints (HCC)  Seeing Dr. Jefm Bryant   5. Migraine without aura and responsive to treatment  He has noticed increase  in headaches lately - ketorolac (TORADOL) 10 MG tablet; Take 1 tablet (10 mg total) by mouth every 6 (six) hours as needed.  Dispense: 60 tablet; Refill: 0 - rizatriptan (MAXALT) 10 MG tablet; Take 1 tablet (10 mg total) by mouth as needed for migraine.  Dispense: 10 tablet; Refill: 0 - promethazine (PHENERGAN) 12.5 MG tablet; Take 1 tablet (12.5 mg total) by mouth every 8 (eight) hours as needed for nausea or vomiting.  Dispense: 20 tablet; Refill: 0  6. Elevated platelet count  Had labs done this morning   7. Mucopurulent chronic bronchitis (HCC)  Coughing more than usual over the past week, no fever, discussed PFT , needs to quit smoking , I will give him Trelegy  today to take for the next two weeks - Fluticasone-Umeclidin-Vilant (TRELEGY ELLIPTA) 100-62.5-25 MCG/INH AEPB; Inhale 1 each into the lungs daily.  Dispense: 28 each; Refill: 0   8. Essential hypertension  bp is at goal  - amLODipine-benazepril (LOTREL) 5-40 MG capsule; Take 1 capsule by mouth daily.  Dispense: 90 capsule; Refill: 1 - carvedilol (COREG) 12.5 MG tablet; Take 1 tablet (12.5 mg total) by mouth 2 (two) times daily with a meal.  Dispense: 180 tablet; Refill: 1  9. Oral candidiasis  - fluconazole (DIFLUCAN) 150 MG tablet; Take 1 tablet (150 mg total) by mouth every other day.  Dispense: 3 tablet; Refill: 0

## 2016-07-06 LAB — COMPREHENSIVE METABOLIC PANEL
A/G RATIO: 1.4 (ref 1.2–2.2)
ALK PHOS: 66 IU/L (ref 39–117)
ALT: 19 IU/L (ref 0–44)
AST: 14 IU/L (ref 0–40)
Albumin: 3.8 g/dL (ref 3.5–5.5)
BUN/Creatinine Ratio: 20 (ref 9–20)
BUN: 16 mg/dL (ref 6–24)
Bilirubin Total: 0.2 mg/dL (ref 0.0–1.2)
CHLORIDE: 102 mmol/L (ref 96–106)
CO2: 25 mmol/L (ref 18–29)
Calcium: 9 mg/dL (ref 8.7–10.2)
Creatinine, Ser: 0.8 mg/dL (ref 0.76–1.27)
GFR calc non Af Amer: 102 mL/min/{1.73_m2} (ref >59)
GFR, EST AFRICAN AMERICAN: 118 mL/min/{1.73_m2} (ref >59)
Globulin, Total: 2.8 g/dL (ref 1.5–4.5)
Glucose: 120 mg/dL — ABNORMAL HIGH (ref 65–99)
POTASSIUM: 4.7 mmol/L (ref 3.5–5.2)
SODIUM: 139 mmol/L (ref 134–144)
TOTAL PROTEIN: 6.6 g/dL (ref 6.0–8.5)

## 2016-07-06 LAB — CBC WITH DIFFERENTIAL/PLATELET
BASOS: 1 %
Basophils Absolute: 0.1 10*3/uL (ref 0.0–0.2)
EOS (ABSOLUTE): 0.2 10*3/uL (ref 0.0–0.4)
Eos: 2 %
Hematocrit: 39.6 % (ref 37.5–51.0)
Hemoglobin: 12.3 g/dL — ABNORMAL LOW (ref 13.0–17.7)
Immature Grans (Abs): 0 10*3/uL (ref 0.0–0.1)
Immature Granulocytes: 0 %
LYMPHS ABS: 2.8 10*3/uL (ref 0.7–3.1)
Lymphs: 25 %
MCH: 23.6 pg — ABNORMAL LOW (ref 26.6–33.0)
MCHC: 31.1 g/dL — AB (ref 31.5–35.7)
MCV: 76 fL — AB (ref 79–97)
MONOS ABS: 0.8 10*3/uL (ref 0.1–0.9)
Monocytes: 7 %
NEUTROS ABS: 7.4 10*3/uL — AB (ref 1.4–7.0)
Neutrophils: 65 %
PLATELETS: 370 10*3/uL (ref 150–379)
RBC: 5.21 x10E6/uL (ref 4.14–5.80)
RDW: 16.4 % — AB (ref 12.3–15.4)
WBC: 11.4 10*3/uL — ABNORMAL HIGH (ref 3.4–10.8)

## 2016-07-06 LAB — LIPID PANEL
CHOL/HDL RATIO: 5.4 ratio — AB (ref 0.0–5.0)
Cholesterol, Total: 162 mg/dL (ref 100–199)
HDL: 30 mg/dL — AB (ref >39)
LDL Calculated: 99 mg/dL (ref 0–99)
TRIGLYCERIDES: 165 mg/dL — AB (ref 0–149)
VLDL Cholesterol Cal: 33 mg/dL (ref 5–40)

## 2016-07-06 LAB — HEMOGLOBIN A1C
Est. average glucose Bld gHb Est-mCnc: 128 mg/dL
HEMOGLOBIN A1C: 6.1 % — AB (ref 4.8–5.6)

## 2016-07-22 ENCOUNTER — Other Ambulatory Visit: Payer: Self-pay | Admitting: Family Medicine

## 2016-07-22 DIAGNOSIS — K219 Gastro-esophageal reflux disease without esophagitis: Secondary | ICD-10-CM

## 2016-07-22 NOTE — Telephone Encounter (Signed)
Patient requesting refill of Omeprazole to [facility]. 

## 2016-07-26 ENCOUNTER — Other Ambulatory Visit: Payer: Self-pay | Admitting: Family Medicine

## 2016-07-26 DIAGNOSIS — M159 Polyosteoarthritis, unspecified: Secondary | ICD-10-CM

## 2016-07-26 DIAGNOSIS — M150 Primary generalized (osteo)arthritis: Secondary | ICD-10-CM

## 2016-07-26 DIAGNOSIS — M069 Rheumatoid arthritis, unspecified: Secondary | ICD-10-CM

## 2016-08-23 ENCOUNTER — Other Ambulatory Visit: Payer: Self-pay | Admitting: Family Medicine

## 2016-08-23 DIAGNOSIS — M159 Polyosteoarthritis, unspecified: Secondary | ICD-10-CM

## 2016-08-23 DIAGNOSIS — M150 Primary generalized (osteo)arthritis: Secondary | ICD-10-CM

## 2016-08-23 DIAGNOSIS — M069 Rheumatoid arthritis, unspecified: Secondary | ICD-10-CM

## 2016-08-23 NOTE — Telephone Encounter (Signed)
Patient requesting refill of Celebrex to [facility].  

## 2016-08-26 ENCOUNTER — Other Ambulatory Visit: Payer: Self-pay

## 2016-08-26 DIAGNOSIS — J411 Mucopurulent chronic bronchitis: Secondary | ICD-10-CM

## 2016-08-26 NOTE — Telephone Encounter (Signed)
Patient wife called and stated the Trelegy Inhaler has helped and was given a sample. Could you please call in a prescription for the patient. Thanks.

## 2016-08-27 MED ORDER — FLUTICASONE-UMECLIDIN-VILANT 100-62.5-25 MCG/INH IN AEPB: 1.0000 | INHALATION_SPRAY | Freq: Every day | RESPIRATORY_TRACT | 0 refills | Status: DC

## 2016-09-20 ENCOUNTER — Other Ambulatory Visit: Payer: Self-pay | Admitting: Family Medicine

## 2016-09-20 DIAGNOSIS — M159 Polyosteoarthritis, unspecified: Secondary | ICD-10-CM

## 2016-09-20 DIAGNOSIS — M150 Primary generalized (osteo)arthritis: Principal | ICD-10-CM

## 2016-09-20 NOTE — Telephone Encounter (Signed)
Pt is needing refill on voltaren gel. Pharm is [facility] in [place]

## 2016-09-21 ENCOUNTER — Other Ambulatory Visit: Payer: Self-pay | Admitting: Family Medicine

## 2016-09-21 DIAGNOSIS — M150 Primary generalized (osteo)arthritis: Secondary | ICD-10-CM

## 2016-09-21 DIAGNOSIS — M069 Rheumatoid arthritis, unspecified: Secondary | ICD-10-CM

## 2016-09-21 DIAGNOSIS — M159 Polyosteoarthritis, unspecified: Secondary | ICD-10-CM

## 2016-09-21 MED ORDER — DICLOFENAC SODIUM 1 % TD GEL: TRANSDERMAL | 0 refills | Status: DC

## 2016-09-21 NOTE — Telephone Encounter (Signed)
Patient requesting refill of Celebrex to [facility].  

## 2016-09-30 ENCOUNTER — Other Ambulatory Visit: Payer: Self-pay | Admitting: Family Medicine

## 2016-09-30 DIAGNOSIS — J411 Mucopurulent chronic bronchitis: Secondary | ICD-10-CM

## 2016-09-30 NOTE — Telephone Encounter (Signed)
Patient requesting refill of Trelegy to [facility].

## 2016-10-05 ENCOUNTER — Ambulatory Visit: Payer: BLUE CROSS/BLUE SHIELD | Admitting: Family Medicine

## 2016-10-19 ENCOUNTER — Other Ambulatory Visit: Payer: Self-pay | Admitting: Family Medicine

## 2016-10-19 DIAGNOSIS — M159 Polyosteoarthritis, unspecified: Secondary | ICD-10-CM

## 2016-10-19 DIAGNOSIS — M150 Primary generalized (osteo)arthritis: Secondary | ICD-10-CM

## 2016-10-19 DIAGNOSIS — M069 Rheumatoid arthritis, unspecified: Secondary | ICD-10-CM

## 2016-10-19 NOTE — Telephone Encounter (Signed)
Patient requesting refill of Celebrex to [facility].

## 2016-10-20 ENCOUNTER — Other Ambulatory Visit: Payer: Self-pay

## 2016-10-20 DIAGNOSIS — K219 Gastro-esophageal reflux disease without esophagitis: Secondary | ICD-10-CM

## 2016-10-22 ENCOUNTER — Other Ambulatory Visit: Payer: Self-pay | Admitting: Family Medicine

## 2016-10-22 DIAGNOSIS — K219 Gastro-esophageal reflux disease without esophagitis: Secondary | ICD-10-CM

## 2016-10-22 NOTE — Telephone Encounter (Signed)
Patient requesting refill of Omeprazole to [facility].

## 2016-10-27 ENCOUNTER — Ambulatory Visit (INDEPENDENT_AMBULATORY_CARE_PROVIDER_SITE_OTHER): Payer: Self-pay | Admitting: Family Medicine

## 2016-10-27 ENCOUNTER — Encounter: Payer: Self-pay | Admitting: Family Medicine

## 2016-10-27 VITALS — BP 123/76 | HR 82 | Temp 97.7°F | Resp 16 | Ht 67.0 in | Wt 210.0 lb

## 2016-10-27 DIAGNOSIS — Z23 Encounter for immunization: Secondary | ICD-10-CM

## 2016-10-27 DIAGNOSIS — E785 Hyperlipidemia, unspecified: Secondary | ICD-10-CM

## 2016-10-27 DIAGNOSIS — B370 Candidal stomatitis: Secondary | ICD-10-CM

## 2016-10-27 DIAGNOSIS — E1169 Type 2 diabetes mellitus with other specified complication: Secondary | ICD-10-CM

## 2016-10-27 DIAGNOSIS — E119 Type 2 diabetes mellitus without complications: Secondary | ICD-10-CM

## 2016-10-27 DIAGNOSIS — I209 Angina pectoris, unspecified: Secondary | ICD-10-CM | POA: Insufficient documentation

## 2016-10-27 DIAGNOSIS — I10 Essential (primary) hypertension: Secondary | ICD-10-CM

## 2016-10-27 DIAGNOSIS — M159 Polyosteoarthritis, unspecified: Secondary | ICD-10-CM

## 2016-10-27 DIAGNOSIS — M069 Rheumatoid arthritis, unspecified: Secondary | ICD-10-CM

## 2016-10-27 DIAGNOSIS — G4733 Obstructive sleep apnea (adult) (pediatric): Secondary | ICD-10-CM

## 2016-10-27 DIAGNOSIS — K219 Gastro-esophageal reflux disease without esophagitis: Secondary | ICD-10-CM

## 2016-10-27 DIAGNOSIS — J411 Mucopurulent chronic bronchitis: Secondary | ICD-10-CM

## 2016-10-27 DIAGNOSIS — G43009 Migraine without aura, not intractable, without status migrainosus: Secondary | ICD-10-CM

## 2016-10-27 DIAGNOSIS — M150 Primary generalized (osteo)arthritis: Secondary | ICD-10-CM

## 2016-10-27 LAB — POCT GLYCOSYLATED HEMOGLOBIN (HGB A1C): Hemoglobin A1C: 6.1

## 2016-10-27 MED ORDER — FLUTICASONE-UMECLIDIN-VILANT 100-62.5-25 MCG/INH IN AEPB: 1.0000 | INHALATION_SPRAY | Freq: Every day | RESPIRATORY_TRACT | 5 refills | Status: DC

## 2016-10-27 MED ORDER — OMEPRAZOLE 20 MG PO CPDR: 20.0000 mg | DELAYED_RELEASE_CAPSULE | Freq: Every day | ORAL | 1 refills | Status: DC

## 2016-10-27 MED ORDER — KETOROLAC TROMETHAMINE 10 MG PO TABS: 10.0000 mg | ORAL_TABLET | Freq: Four times a day (QID) | ORAL | 0 refills | Status: DC | PRN

## 2016-10-27 MED ORDER — RIZATRIPTAN BENZOATE 10 MG PO TABS: 10.0000 mg | ORAL_TABLET | ORAL | 0 refills | Status: DC | PRN

## 2016-10-27 MED ORDER — CARVEDILOL 12.5 MG PO TABS: 12.5000 mg | ORAL_TABLET | Freq: Two times a day (BID) | ORAL | 1 refills | Status: DC

## 2016-10-27 MED ORDER — FLUCONAZOLE 150 MG PO TABS: 150.0000 mg | ORAL_TABLET | ORAL | 0 refills | Status: DC

## 2016-10-27 MED ORDER — AMLODIPINE BESY-BENAZEPRIL HCL 5-40 MG PO CAPS: 1.0000 | ORAL_CAPSULE | Freq: Every day | ORAL | 1 refills | Status: DC

## 2016-10-27 NOTE — Progress Notes (Signed)
Name: Tim Anderson   MRN: [id number]    DOB: Jul 08, 1965   Date:10/27/2016       Progress Note  Subjective  Chief Complaint  Chief Complaint  Patient presents with  . Medication Refill  . Diabetes  . Hyperlipidemia  . Gastroesophageal Reflux    HPI  Chest pain: he left side chest pain, that can last 20 minutes during rest or activity, described as aching, no nausea or diaphoresis, he has orthopnea but that has been going on for months, he saw Dr. Ubaldo Glassing, he was given NTG and scheduled to have a stress test, but currently does not have insurance and will hold off to have study done  GERD: he is on medication and symptoms are controlled. He states he has heartburn if he skips multiple dose of medication, he also has intermittent regurgitation. Discussed risk of long term use of PPI, but he has been unable to stop medication completely  OSA: he has not been using CPAP machine anymore. He states he stopped about 5years ago because he was feeling claustrophobic. Discussed increase in risk of MI and strokes  OA: he has OA of multiple joints, he had a right total knee replacement in December 2016 and the pain has improved, good rom, will need left hip replacement and left knee replacement in the near future, trying to have it done by Dr. Norwood Levo in Nov, but is looking for insurance coverage first. He ran out of insurance therefore could not fill Celebrex, pain is the same and he does not want to go back on medication.   Migraine/headaches: he has dull ache/tension type headachedull frontal headache  daily he takes Toradol prn for that. He has migraine seldom, nuchal and associated with nausea and vomiting, a few episodes per month.He has phonophobia and photophobia with episodes of migraine, sometimes vomiting. He needs refill of medications Morning headache may be secondary to lack of compliance with CPAP, also revound headache, he was taking celebrex and stopped because of cost, since  than has to take Toradol, advised to stop it also and only take it for migraine  COPD: no longer using Sipiriva, using Combiventprn only, has daily cough, that is productive, yellow in color, trying to wean self off cigarettes. Currently on half pack daily. Unable to use afford Trelegy at this time.  RA: sees Rheumatologist, off Enbrel until he can established with a new provider ( insurance change ), but on Plaquenil nowstill has daily pain but stable at this time.  HTN: bp is at goal, he has intermittent chest pain and palpitation.   DMII: diagnosed Nov 2016, he is on diet only. He denies polyphagia or polyuria, but he has polydipsia. HgbA1c is still at goal. He has ED and takes Revation prn - that causes diarrhea the following day. Also has Dyslipidemia with very low HDL, discussed ways to bring it up. He has shaking mid-morning if he skips breakfast  Hyperlipidemia: he stopped Lipitor and Crestor because of muscle aches. He has low HDL    BPH: sees Dr. Jacqlyn Larsen, taking Flomax   Patient Active Problem List   Diagnosis Date Noted  . Angina pectoris ([place]) 10/27/2016  . Carpal tunnel syndrome on both sides 04/07/2016  . Primary osteoarthritis involving multiple joints 07/29/2015  . Arthritis of knee, degenerative 02/03/2015  . Diabetes mellitus type 2, diet-controlled ([place]) 01/27/2015  . Benign fibroma of prostate 09/28/2014  . Bronchitis with chronic airway obstruction ([place]) 09/28/2014  . Drug abuse 09/28/2014  . Deflected  nasal septum 09/28/2014  . Dyslipidemia 09/28/2014  . Dysmetabolic syndrome 62/70/3500  . GERD (gastroesophageal reflux disease) 09/28/2014  . H/O renal calculi 09/28/2014  . Benign hypertension 09/28/2014  . Male hypogonadism 09/28/2014  . Failure of erection 09/28/2014  . Obstructive apnea 09/28/2014  . Depression with anxiety 09/28/2014  . Obesity (BMI 30-39.9) 09/28/2014  . Migraine without aura and responsive to treatment 09/28/2014  . Perennial  allergic rhinitis with seasonal variation 09/28/2014  . Elevated platelet count 09/28/2014  . Tobacco abuse 09/28/2014  . Cerebrovascular accident (CVA) ([place]) 07/27/2013  . History of TIA (transient ischemic attack) 09/20/2011  . Rheumatoid arthritis involving multiple joints ([place]) 07/31/2009    Past Surgical History:  Procedure Laterality Date  . FACIAL RECONSTRUCTION SURGERY    . KNEE SURGERY Right     Family History  Problem Relation Age of Onset  . Diabetes Mother   . Diabetes Father   . Heart attack Father   . Cancer Brother        Skin and Prostate-Oldest Brother  . Diabetes Brother     Social History   Social History  . Marital status: Married    Spouse name: N/A  . Number of children: N/A  . Years of education: N/A   Occupational History  . Not on file.   Social History Main Topics  . Smoking status: Current Every Day Smoker    Packs/day: 0.75    Years: 38.00    Types: Cigarettes    Start date: 09/30/1976  . Smokeless tobacco: Never Used  . Alcohol use 0.0 oz/week     Comment: occasionally  . Drug use: No  . Sexual activity: Yes    Partners: Female   Other Topics Concern  . Not on file   Social History Narrative  . No narrative on file     Current Outpatient Prescriptions:  .  albuterol-ipratropium (COMBIVENT) 18-103 MCG/ACT inhaler, Inhale 2 puffs into the lungs every 6 (six) hours as needed for wheezing or shortness of breath., Disp: 14.7 g, Rfl: 2 .  amLODipine-benazepril (LOTREL) 5-40 MG capsule, Take 1 capsule by mouth daily., Disp: 90 capsule, Rfl: 1 .  aspirin EC 81 MG tablet, Take 81 mg by mouth. Reported on 07/29/2015, Disp: , Rfl:  .  carvedilol (COREG) 12.5 MG tablet, Take 1 tablet (12.5 mg total) by mouth 2 (two) times daily with a meal., Disp: 180 tablet, Rfl: 1 .  diclofenac sodium (VOLTAREN) 1 % GEL, APPLY 4 GRAMS TOPICALLY 4 TIMES A DAY., Disp: 300 g, Rfl: 0 .  Fluticasone-Umeclidin-Vilant (TRELEGY ELLIPTA) 100-62.5-25 MCG/INH AEPB,  Take 1 puff by mouth daily., Disp: 60 each, Rfl: 5 .  hydroxychloroquine (PLAQUENIL) 200 MG tablet, Take 1 tablet by mouth daily., Disp: , Rfl:  .  ketorolac (TORADOL) 10 MG tablet, Take 1 tablet (10 mg total) by mouth every 6 (six) hours as needed., Disp: 60 tablet, Rfl: 0 .  nitroGLYCERIN (NITROSTAT) 0.4 MG SL tablet, Place under the tongue., Disp: , Rfl:  .  omeprazole (PRILOSEC) 20 MG capsule, Take 1 capsule (20 mg total) by mouth daily., Disp: 90 capsule, Rfl: 1 .  promethazine (PHENERGAN) 12.5 MG tablet, Take 1 tablet (12.5 mg total) by mouth every 8 (eight) hours as needed for nausea or vomiting., Disp: 20 tablet, Rfl: 0 .  rizatriptan (MAXALT) 10 MG tablet, Take 1 tablet (10 mg total) by mouth as needed for migraine., Disp: 10 tablet, Rfl: 0 .  sildenafil (REVATIO) 20 MG tablet, 3-5 tablets  po daily as needed, Disp: , Rfl:  .  tamsulosin (FLOMAX) 0.4 MG CAPS capsule, TAKE ONE CAPSULE DAILY., Disp: 30 capsule, Rfl: 0 .  fluconazole (DIFLUCAN) 150 MG tablet, Take 1 tablet (150 mg total) by mouth every other day., Disp: 3 tablet, Rfl: 0  Allergies  Allergen Reactions  . Ciprofloxacin Other (See Comments)    Body aches  . Crestor [Rosuvastatin Calcium] Other (See Comments)    Muscles aches  . Lipofen  [Fenofibrate]     muscle aches muscle aches  . Niacin Er     flushed  . Other Other (See Comments)    Depiridoc - "Stopped my heart"     ROS  Constitutional: Negative for fever or significant weight change.  Respiratory: Positive  for cough and shortness of breath.   Cardiovascular: Positive for chest pain and intermittent  palpitations.  Gastrointestinal: Negative for abdominal pain, he has diarrhea when he takes Revatio. Musculoskeletal: Negative for gait problem or joint swelling.  Skin: Negative for rash.  Neurological: Negative for dizziness, positive for intermittent  headache.  No other specific complaints in a complete review of systems (except as listed in HPI  above).  Objective  Vitals:   10/27/16 0802  BP: 123/76  Pulse: 82  Resp: 16  Temp: 97.7 F (36.5 C)  TempSrc: Oral  SpO2: 97%  Weight: 210 lb (95.3 kg)  Height: 5\' 7"  (1.702 m)    Body mass index is 32.89 kg/m.  Physical Exam  Constitutional: Patient appears well-developed and well-nourished. Obese No distress.  HEENT: head atraumatic, normocephalic, pupils equal and reactive to light, tongue is red and inflamed with white spots, neck supple, throat within normal limits Cardiovascular: Normal rate, regular rhythm and normal heart sounds.  No murmur heard. No BLE edema. Pulmonary/Chest: Effort normal and breath sounds normal. No respiratory distress. Abdominal: Soft.  There is no tenderness. Muscular Skeletal: no synovitis at this time, crepitus with extension of both knees, decrease rom of left hip Psychiatric: Patient has a normal mood and affect. behavior is normal. Judgment and thought content normal.  PHQ2/9: Depression screen [facility] 2/9 10/27/2016 04/07/2016 10/29/2015 07/29/2015 04/28/2015  Decreased Interest 0 0 0 0 0  Down, Depressed, Hopeless 0 0 0 0 0  PHQ - 2 Score 0 0 0 0 0    Fall Risk: Fall Risk  10/27/2016 04/07/2016 10/29/2015 07/29/2015 04/28/2015  Falls in the past year? No No No No No  Number falls in past yr: - - - - -  Injury with Fall? - - - - -    Functional Status Survey: Is the patient deaf or have difficulty hearing?: No Does the patient have difficulty seeing, even when wearing glasses/contacts?: No Does the patient have difficulty concentrating, remembering, or making decisions?: No Does the patient have difficulty walking or climbing stairs?: Yes (hip and knee pain ) Does the patient have difficulty dressing or bathing?: No Does the patient have difficulty doing errands alone such as visiting a doctor's office or shopping?: No   Assessment & Plan  1. Diabetes mellitus type 2, diet-controlled (HCC)  - POCT glycosylated hemoglobin (Hb A1C)  2.  Dyslipidemia associated with type 2 diabetes mellitus (HCC)  - POCT glycosylated hemoglobin (Hb A1C)  3. Obstructive apnea  Not using it. Explained importance of compliance  4. Rheumatoid arthritis involving multiple joints (HCC)  Continue follow up with Dr. Jefm Bryant  5. Migraine without aura and responsive to treatment  - ketorolac (TORADOL) 10 MG tablet; Take 1  tablet (10 mg total) by mouth every 6 (six) hours as needed.  Dispense: 60 tablet; Refill: 0 - rizatriptan (MAXALT) 10 MG tablet; Take 1 tablet (10 mg total) by mouth as needed for migraine.  Dispense: 10 tablet; Refill: 0  6. Essential hypertension  bp is at goal  - amLODipine-benazepril (LOTREL) 5-40 MG capsule; Take 1 capsule by mouth daily.  Dispense: 90 capsule; Refill: 1 - carvedilol (COREG) 12.5 MG tablet; Take 1 tablet (12.5 mg total) by mouth 2 (two) times daily with a meal.  Dispense: 180 tablet; Refill: 1  7. Mucopurulent chronic bronchitis ([place])  Off medication because of cost - Fluticasone-Umeclidin-Vilant (TRELEGY ELLIPTA) 100-62.5-25 MCG/INH AEPB; Take 1 puff by mouth daily.  Dispense: 60 each; Refill: 5  8. Primary osteoarthritis involving multiple joints  Off Celebrex because of cost , and has not noticed any difference in the pain level   9. Needs flu shot  Refused because of cost  10. Gastroesophageal reflux disease without esophagitis  - omeprazole (PRILOSEC) 20 MG capsule; Take 1 capsule (20 mg total) by mouth daily.  Dispense: 90 capsule; Refill: 1  11. Angina pectoris ([place])  Seen by Dr. Ubaldo Glassing, states NTG works, not able to get stress test because of cost, but will go back once he gest insurance  12. Oral candidiasis  - fluconazole (DIFLUCAN) 150 MG tablet; Take 1 tablet (150 mg total) by mouth every other day.  Dispense: 3 tablet; Refill: 0

## 2016-12-16 DIAGNOSIS — Z96649 Presence of unspecified artificial hip joint: Secondary | ICD-10-CM

## 2016-12-16 HISTORY — DX: Presence of unspecified artificial hip joint: Z96.649

## 2016-12-27 DIAGNOSIS — Z96642 Presence of left artificial hip joint: Secondary | ICD-10-CM

## 2016-12-27 HISTORY — DX: Presence of left artificial hip joint: Z96.642

## 2017-01-17 ENCOUNTER — Other Ambulatory Visit: Payer: Self-pay | Admitting: Family Medicine

## 2017-01-17 DIAGNOSIS — G43009 Migraine without aura, not intractable, without status migrainosus: Secondary | ICD-10-CM

## 2017-01-17 NOTE — Telephone Encounter (Signed)
Refill request for general medication: Promethazine 12.5 mg  Last office visit: 10/27/2016   Last physical exam: None indicated.

## 2017-02-21 ENCOUNTER — Other Ambulatory Visit: Payer: Self-pay | Admitting: Family Medicine

## 2017-02-21 DIAGNOSIS — K219 Gastro-esophageal reflux disease without esophagitis: Secondary | ICD-10-CM

## 2017-03-03 ENCOUNTER — Other Ambulatory Visit: Payer: Self-pay | Admitting: Family Medicine

## 2017-03-03 DIAGNOSIS — K219 Gastro-esophageal reflux disease without esophagitis: Secondary | ICD-10-CM

## 2017-03-03 NOTE — Telephone Encounter (Signed)
Refill request for general medication: Omeprazole to [facility].   Last office visit: 10/27/2016  Follow up 04/26/2017

## 2017-04-07 ENCOUNTER — Other Ambulatory Visit: Payer: Self-pay | Admitting: Family Medicine

## 2017-04-07 DIAGNOSIS — I10 Essential (primary) hypertension: Secondary | ICD-10-CM

## 2017-04-07 NOTE — Telephone Encounter (Signed)
Hypertension medication request: Lotrel to [facility].   Last office visit pertaining to hypertension: 10/27/2016   BP Readings from Last 3 Encounters:  10/27/16 123/76  07/05/16 124/78  04/07/16 (!) 154/92    Lab Results  Component Value Date   CREATININE 0.80 07/05/2016   BUN 16 07/05/2016   NA 139 07/05/2016   K 4.7 07/05/2016   CL 102 07/05/2016   CO2 25 07/05/2016     Follow up on 04/26/2017

## 2017-04-26 ENCOUNTER — Ambulatory Visit: Payer: Self-pay | Admitting: Family Medicine

## 2017-05-05 ENCOUNTER — Other Ambulatory Visit: Payer: Self-pay | Admitting: Family Medicine

## 2017-05-05 DIAGNOSIS — I10 Essential (primary) hypertension: Secondary | ICD-10-CM

## 2017-05-05 NOTE — Telephone Encounter (Signed)
Hypertension medication request: Amlodipine-Benazepril to [facility].   Last office visit pertaining to hypertension: 10/27/2016   BP Readings from Last 3 Encounters:  10/27/16 123/76  07/05/16 124/78  04/07/16 (!) 154/92    Lab Results  Component Value Date   CREATININE 0.80 07/05/2016   BUN 16 07/05/2016   NA 139 07/05/2016   K 4.7 07/05/2016   CL 102 07/05/2016   CO2 25 07/05/2016     No follow-ups on file.

## 2017-05-06 ENCOUNTER — Other Ambulatory Visit: Payer: Self-pay | Admitting: Family Medicine

## 2017-05-06 DIAGNOSIS — I10 Essential (primary) hypertension: Secondary | ICD-10-CM

## 2017-05-10 ENCOUNTER — Encounter: Payer: Self-pay | Admitting: Family Medicine

## 2017-05-10 ENCOUNTER — Ambulatory Visit: Payer: 59 | Admitting: Family Medicine

## 2017-05-10 VITALS — BP 130/80 | HR 88 | Resp 16 | Ht 67.0 in | Wt 223.8 lb

## 2017-05-10 DIAGNOSIS — M159 Polyosteoarthritis, unspecified: Secondary | ICD-10-CM

## 2017-05-10 DIAGNOSIS — K219 Gastro-esophageal reflux disease without esophagitis: Secondary | ICD-10-CM

## 2017-05-10 DIAGNOSIS — E1169 Type 2 diabetes mellitus with other specified complication: Secondary | ICD-10-CM | POA: Diagnosis not present

## 2017-05-10 DIAGNOSIS — G43009 Migraine without aura, not intractable, without status migrainosus: Secondary | ICD-10-CM | POA: Diagnosis not present

## 2017-05-10 DIAGNOSIS — D649 Anemia, unspecified: Secondary | ICD-10-CM

## 2017-05-10 DIAGNOSIS — D473 Essential (hemorrhagic) thrombocythemia: Secondary | ICD-10-CM | POA: Diagnosis not present

## 2017-05-10 DIAGNOSIS — J411 Mucopurulent chronic bronchitis: Secondary | ICD-10-CM | POA: Diagnosis not present

## 2017-05-10 DIAGNOSIS — M069 Rheumatoid arthritis, unspecified: Secondary | ICD-10-CM | POA: Diagnosis not present

## 2017-05-10 DIAGNOSIS — E785 Hyperlipidemia, unspecified: Secondary | ICD-10-CM

## 2017-05-10 DIAGNOSIS — I10 Essential (primary) hypertension: Secondary | ICD-10-CM

## 2017-05-10 DIAGNOSIS — E1159 Type 2 diabetes mellitus with other circulatory complications: Secondary | ICD-10-CM

## 2017-05-10 DIAGNOSIS — I209 Angina pectoris, unspecified: Secondary | ICD-10-CM | POA: Diagnosis not present

## 2017-05-10 DIAGNOSIS — M150 Primary generalized (osteo)arthritis: Secondary | ICD-10-CM

## 2017-05-10 LAB — POCT GLYCOSYLATED HEMOGLOBIN (HGB A1C): Hemoglobin A1C: 6

## 2017-05-10 MED ORDER — KETOROLAC TROMETHAMINE 10 MG PO TABS: 10.0000 mg | ORAL_TABLET | Freq: Four times a day (QID) | ORAL | 0 refills | Status: DC | PRN

## 2017-05-10 MED ORDER — CARVEDILOL 12.5 MG PO TABS: 12.5000 mg | ORAL_TABLET | Freq: Two times a day (BID) | ORAL | 1 refills | Status: DC

## 2017-05-10 MED ORDER — OMEPRAZOLE 20 MG PO CPDR: 20.0000 mg | DELAYED_RELEASE_CAPSULE | Freq: Every day | ORAL | 1 refills | Status: DC

## 2017-05-10 MED ORDER — PROMETHAZINE HCL 12.5 MG PO TABS: 12.5000 mg | ORAL_TABLET | Freq: Three times a day (TID) | ORAL | 0 refills | Status: DC | PRN

## 2017-05-10 MED ORDER — DICLOFENAC SODIUM 1 % TD GEL: TRANSDERMAL | 0 refills | Status: DC

## 2017-05-10 MED ORDER — FLUTICASONE-UMECLIDIN-VILANT 100-62.5-25 MCG/INH IN AEPB: 1.0000 | INHALATION_SPRAY | Freq: Every day | RESPIRATORY_TRACT | 5 refills | Status: DC

## 2017-05-10 NOTE — Progress Notes (Signed)
Name: Tim Anderson   MRN: [id number]    DOB: 01/14/1966   Date:05/10/2017       Progress Note  Subjective  Chief Complaint  Chief Complaint  Patient presents with  . Medication Refill  . Diabetes  . Hyperlipidemia    HPI   Angina he states had a stress test in October prior to left hip surgery, no chest pain since, denies decrease in exercise tolerance, still has NTG at home.   GERD: he is on medication and symptoms are controlled he has been taking medication daily, states when skipping symptoms returns.  OSA: he has not been using CPAP machine anymore. He states he stopped about 6 years ago because he was feeling claustrophobic. Discussed increase in risk of MI and strokes  OA: he has OA of multiple joints, he had a right total knee replacement in December 2016 and left hip replacement 12/2016, doing well at this time. Not on medication for pain or inflammation.   Migraine/headaches: he has dull ache/tension type headachedull frontal headache at least once a week,he takes Toradol prn for that. He has migraine seldom, nuchal and associated with nausea and vomiting, very seldom now, one episode in the past 6 months. He has phonophobia and photophobia with episodes of migraine, sometimes vomiting. Better since he stopped smoking  COPD: no longer using Sipiriva, using Combiventprn only, has daily cough, that is productive, yellow in color, trying to wean self off cigarettes. Currently on half pack daily. Unable to use afford Trelegy at this time.  RA: sees Rheumatologist, off Enbrel on Plaquenil for over one year and is doing well, still has swelling and pain on hands/fingers but not constant. Eye exam is up to date   HTN: bp is at goal, no recent episodes of chest pain or palpitation.   DMII: diagnosed Nov 2016, he is on diet only, gained a lot of weight since he quit smoking 11/2016. He denies polyphagia, polydipsia,  polyuria.. HgbA1c is still at goal. He has ED and  takes Revation prn - that causes diarrhea the following day, he will discuss it with Dr. Jacqlyn Larsen. Also has Dyslipidemia with very low HDL, discussed ways to bring it up.  Hyperlipidemia: he stopped Lipitor and Crestor because of muscle aches. He has low HDL   COPD: quit smoking 11/2016, still has sob twice a day, and productive cough, we will resume Trelegy.   BPH: sees Dr. Jacqlyn Larsen, taking Flomax    Patient Active Problem List   Diagnosis Date Noted  . 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 History of left hip replacement 12/27/2016  . Angina pectoris ([place]) 10/27/2016  . Carpal tunnel syndrome on both sides 04/07/2016  . Primary osteoarthritis involving multiple joints 07/29/2015  . Arthritis of knee, degenerative 02/03/2015  . Dyslipidemia associated with type 2 diabetes mellitus ([place]) 01/27/2015  . Benign fibroma of prostate 09/28/2014  . Bronchitis with chronic airway obstruction ([place]) 09/28/2014  . Drug abuse ([place]) 09/28/2014  . Deflected nasal septum 09/28/2014  . Dyslipidemia 09/28/2014  . Dysmetabolic syndrome 63/87/5643  . GERD (gastroesophageal reflux disease) 09/28/2014  . H/O renal calculi 09/28/2014  . Benign hypertension 09/28/2014  . Male hypogonadism 09/28/2014  . Failure of erection 09/28/2014  . Obstructive apnea 09/28/2014  . Depression with anxiety 09/28/2014  . Obesity (BMI 30-39.9) 09/28/2014  . Migraine without aura and responsive to treatment 09/28/2014  . Perennial allergic rhinitis with seasonal variation 09/28/2014  . Elevated platelet count 09/28/2014  . Tobacco abuse 09/28/2014  .  Cerebrovascular accident (CVA) ([place]) 07/27/2013  . History of TIA (transient ischemic attack) 09/20/2011  . Rheumatoid arthritis involving multiple joints ([place]) 07/31/2009    Past Surgical History:  Procedure Laterality Date  . FACIAL RECONSTRUCTION SURGERY    . KNEE SURGERY Right     Family History  Problem Relation Age of Onset  . Diabetes Mother    . Diabetes Father   . Heart attack Father   . Cancer Brother        Skin and Prostate-Oldest Brother  . Diabetes Brother     Social History   Socioeconomic History  . Marital status: Married    Spouse name: Lattie Haw   . Number of children: 3  . Years of education: Not on file  . Highest education level: 12th grade  Occupational History  . Occupation: Architect     Comment: self employed   Social Needs  . Financial resource strain: Not on file  . Food insecurity:    Worry: Not on file    Inability: Not on file  . Transportation needs:    Medical: Not on file    Non-medical: Not on file  Tobacco Use  . Smoking status: Former Smoker    Packs/day: 0.75    Years: 39.00    Pack years: 29.25    Types: Cigarettes    Start date: 09/30/1976    Last attempt to quit: 12/14/2016    Years since quitting: 0.4  . Smokeless tobacco: Never Used  Substance and Sexual Activity  . Alcohol use: Yes    Alcohol/week: 0.0 oz    Comment: occasionally  . Drug use: Yes    Types: Marijuana    Comment: used to use cocaine  . Sexual activity: Yes    Partners: Female  Lifestyle  . Physical activity:    Days per week: 7 days    Minutes per session: 30 min  . Stress: Not at all  Relationships  . Social connections:    Talks on phone: More than three times a week    Gets together: More than three times a week    Attends religious service: 1 to 4 times per year    Active member of club or organization: No    Attends meetings of clubs or organizations: Never    Relationship status: Married  . Intimate partner violence:    Fear of current or ex partner: No    Emotionally abused: No    Physically abused: No    Forced sexual activity: No  Other Topics Concern  . Not on file  Social History Narrative   On his 4th marriage     Current Outpatient Medications:  .  amLODipine-benazepril (LOTREL) 5-40 MG capsule, Take 1 capsule by mouth daily., Disp: 2 capsule, Rfl: 0 .  aspirin EC 81 MG  tablet, Take 81 mg by mouth. Reported on 07/29/2015, Disp: , Rfl:  .  carvedilol (COREG) 12.5 MG tablet, Take 1 tablet (12.5 mg total) by mouth 2 (two) times daily with a meal., Disp: 180 tablet, Rfl: 1 .  diclofenac sodium (VOLTAREN) 1 % GEL, APPLY 4 GRAMS TOPICALLY 4 TIMES A DAY., Disp: 300 g, Rfl: 0 .  Fluticasone-Umeclidin-Vilant (TRELEGY ELLIPTA) 100-62.5-25 MCG/INH AEPB, Take 1 puff by mouth daily., Disp: 60 each, Rfl: 5 .  hydroxychloroquine (PLAQUENIL) 200 MG tablet, Take 1 tablet by mouth daily., Disp: , Rfl:  .  ketorolac (TORADOL) 10 MG tablet, Take 1 tablet (10 mg total) by mouth every 6 (six)  hours as needed., Disp: 40 tablet, Rfl: 0 .  omeprazole (PRILOSEC) 20 MG capsule, Take 1 capsule (20 mg total) by mouth daily., Disp: 90 capsule, Rfl: 1 .  promethazine (PHENERGAN) 12.5 MG tablet, Take 1 tablet (12.5 mg total) by mouth every 8 (eight) hours as needed for nausea or vomiting., Disp: 20 tablet, Rfl: 0 .  rizatriptan (MAXALT) 10 MG tablet, Take 1 tablet (10 mg total) by mouth as needed for migraine., Disp: 10 tablet, Rfl: 0 .  tamsulosin (FLOMAX) 0.4 MG CAPS capsule, TAKE ONE CAPSULE DAILY., Disp: 30 capsule, Rfl: 0 .  nitroGLYCERIN (NITROSTAT) 0.4 MG SL tablet, Place under the tongue., Disp: , Rfl:   Allergies  Allergen Reactions  . Ciprofloxacin Other (See Comments)    Body aches  . Crestor [Rosuvastatin Calcium] Other (See Comments)    Muscles aches  . Droperidol Other (See Comments)    Heart stopped  . Lipofen  [Fenofibrate]     muscle aches muscle aches  . Niacin Er     flushed     ROS  Constitutional: Negative for fever, positive for  weight change.  Respiratory: positive  for cough and shortness of breath.   Cardiovascular: Negative for chest pain or palpitations.  Gastrointestinal: Negative for abdominal pain, no bowel changes.  Musculoskeletal: positive  For intermittent  gait problem ( has to get up slowly before walking) he also has joint swelling on hands  from RA  Skin: Negative for rash.  Neurological: Negative for dizziness , positive for intermittent  headache.  No other specific complaints in a complete review of systems (except as listed in HPI above).  Objective  Vitals:   05/10/17 1014  BP: 130/80  Pulse: 88  Resp: 16  SpO2: 98%  Weight: 223 lb 12.8 oz (101.5 kg)  Height: 5\' 7"  (1.702 m)    Body mass index is 35.05 kg/m.  Physical Exam  Constitutional: Patient appears well-developed and well-nourished. Obese  No distress.  HEENT: head atraumatic, normocephalic, pupils equal and reactive to light,  neck supple, throat within normal limits Cardiovascular: Normal rate, regular rhythm and normal heart sounds.  No murmur heard. No BLE edema. Pulmonary/Chest: Effort normal and breath sounds normal. No respiratory distress. Abdominal: Soft.  There is no tenderness. Psychiatric: Patient has a normal mood and affect. behavior is normal. Judgment and thought content normal. Muscular skeletal: scar is healing well   Recent Results (from the past 2160 hour(s))  POCT HgB A1C     Status: Abnormal   Collection Time: 05/10/17 10:29 AM  Result Value Ref Range   Hemoglobin A1C 6.0       PHQ2/9: Depression screen [facility] 2/9 10/27/2016 04/07/2016 10/29/2015 07/29/2015 04/28/2015  Decreased Interest 0 0 0 0 0  Down, Depressed, Hopeless 0 0 0 0 0  PHQ - 2 Score 0 0 0 0 0    Fall Risk: Fall Risk  05/10/2017 10/27/2016 04/07/2016 10/29/2015 07/29/2015  Falls in the past year? No No No No No  Number falls in past yr: - - - - -  Injury with Fall? - - - - -     Functional Status Survey: Is the patient deaf or have difficulty hearing?: No Does the patient have difficulty seeing, even when wearing glasses/contacts?: No Does the patient have difficulty concentrating, remembering, or making decisions?: No Does the patient have difficulty walking or climbing stairs?: No Does the patient have difficulty dressing or bathing?: No Does the patient  have difficulty doing errands alone  such as visiting a doctor's office or shopping?: No   Assessment & Plan  1. Gastroesophageal reflux disease without esophagitis  - omeprazole (PRILOSEC) 20 MG capsule; Take 1 capsule (20 mg total) by mouth daily.  Dispense: 90 capsule; Refill: 1  2. Essential hypertension  - carvedilol (COREG) 12.5 MG tablet; Take 1 tablet (12.5 mg total) by mouth 2 (two) times daily with a meal.  Dispense: 180 tablet; Refill: 1 - CBC with Differential/Platelet - COMPLETE METABOLIC PANEL WITH GFR  3. Primary osteoarthritis involving multiple joints  - diclofenac sodium (VOLTAREN) 1 % GEL; APPLY 4 GRAMS TOPICALLY 4 TIMES A DAY.  Dispense: 300 g; Refill: 0  4. Mucopurulent chronic bronchitis ([place])  Quit smoking but still has sob and would like to resume medication  - Fluticasone-Umeclidin-Vilant (TRELEGY ELLIPTA) 100-62.5-25 MCG/INH AEPB; Take 1 puff by mouth daily.  Dispense: 60 each; Refill: 5  5. Migraine without aura and responsive to treatment  - ketorolac (TORADOL) 10 MG tablet; Take 1 tablet (10 mg total) by mouth every 6 (six) hours as needed.  Dispense: 40 tablet; Refill: 0 - promethazine (PHENERGAN) 12.5 MG tablet; Take 1 tablet (12.5 mg total) by mouth every 8 (eight) hours as needed for nausea or vomiting.  Dispense: 20 tablet; Refill: 0  6. Rheumatoid arthritis involving multiple joints (HCC)  Keep follow up with Dr. Jefm Bryant.   7. Dyslipidemia associated with type 2 diabetes mellitus ([place])  - Lipid panel - HgbA1C  8. 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 Anemia, unspecified type  - Iron, TIBC and Ferritin Panel; Future  10. Angina pectoris associated with type 2 diabetes mellitus ([place])  Doing well, had stress test done at [place] and will bring me the results.

## 2017-05-11 ENCOUNTER — Encounter: Payer: Self-pay | Admitting: Family Medicine

## 2017-05-25 ENCOUNTER — Other Ambulatory Visit: Payer: Self-pay | Admitting: Family Medicine

## 2017-05-25 DIAGNOSIS — M159 Polyosteoarthritis, unspecified: Secondary | ICD-10-CM

## 2017-05-25 DIAGNOSIS — M150 Primary generalized (osteo)arthritis: Principal | ICD-10-CM

## 2017-05-25 NOTE — Telephone Encounter (Signed)
Dr. Ancil Boozer just approved a 3 month supply on 05/10/2017 I'm declining any new refills Clarify with pharmacy

## 2017-05-25 NOTE — Telephone Encounter (Signed)
Refill request for general medication.  Voltaren Gel.   Last office visit: 05/10/2017   Follow up 11/10/2017

## 2017-06-06 ENCOUNTER — Other Ambulatory Visit: Payer: Self-pay | Admitting: Family Medicine

## 2017-06-06 DIAGNOSIS — I10 Essential (primary) hypertension: Secondary | ICD-10-CM

## 2017-06-09 ENCOUNTER — Other Ambulatory Visit: Payer: Self-pay | Admitting: Family Medicine

## 2017-06-09 DIAGNOSIS — I10 Essential (primary) hypertension: Secondary | ICD-10-CM

## 2017-06-13 ENCOUNTER — Other Ambulatory Visit: Payer: Self-pay | Admitting: Family Medicine

## 2017-06-13 DIAGNOSIS — M159 Polyosteoarthritis, unspecified: Secondary | ICD-10-CM

## 2017-06-13 DIAGNOSIS — M150 Primary generalized (osteo)arthritis: Principal | ICD-10-CM

## 2017-06-30 ENCOUNTER — Other Ambulatory Visit: Payer: Self-pay | Admitting: Family Medicine

## 2017-06-30 DIAGNOSIS — I10 Essential (primary) hypertension: Secondary | ICD-10-CM

## 2017-07-04 ENCOUNTER — Other Ambulatory Visit: Payer: Self-pay | Admitting: Family Medicine

## 2017-07-04 DIAGNOSIS — I10 Essential (primary) hypertension: Secondary | ICD-10-CM

## 2017-07-05 DIAGNOSIS — I519 Heart disease, unspecified: Secondary | ICD-10-CM | POA: Insufficient documentation

## 2017-07-05 DIAGNOSIS — I5189 Other ill-defined heart diseases: Secondary | ICD-10-CM | POA: Insufficient documentation

## 2017-07-05 DIAGNOSIS — I071 Rheumatic tricuspid insufficiency: Secondary | ICD-10-CM | POA: Insufficient documentation

## 2017-07-05 DIAGNOSIS — I340 Nonrheumatic mitral (valve) insufficiency: Secondary | ICD-10-CM | POA: Insufficient documentation

## 2017-07-06 ENCOUNTER — Encounter: Payer: Self-pay | Admitting: Family Medicine

## 2017-07-06 ENCOUNTER — Other Ambulatory Visit: Payer: Self-pay | Admitting: Family Medicine

## 2017-07-06 DIAGNOSIS — I10 Essential (primary) hypertension: Secondary | ICD-10-CM

## 2017-08-09 ENCOUNTER — Other Ambulatory Visit: Payer: Self-pay | Admitting: Family Medicine

## 2017-08-09 DIAGNOSIS — I10 Essential (primary) hypertension: Secondary | ICD-10-CM

## 2017-09-05 ENCOUNTER — Other Ambulatory Visit: Payer: Self-pay | Admitting: Family Medicine

## 2017-09-05 DIAGNOSIS — K219 Gastro-esophageal reflux disease without esophagitis: Secondary | ICD-10-CM

## 2017-09-05 NOTE — Telephone Encounter (Signed)
Refill request was sent to Dr. Krichna Sowles for approval and submission.  

## 2017-09-07 ENCOUNTER — Other Ambulatory Visit: Payer: Self-pay | Admitting: Family Medicine

## 2017-09-07 DIAGNOSIS — G43009 Migraine without aura, not intractable, without status migrainosus: Secondary | ICD-10-CM

## 2017-10-31 ENCOUNTER — Other Ambulatory Visit: Payer: Self-pay | Admitting: Family Medicine

## 2017-10-31 DIAGNOSIS — K219 Gastro-esophageal reflux disease without esophagitis: Secondary | ICD-10-CM

## 2017-11-10 ENCOUNTER — Ambulatory Visit: Payer: Self-pay | Admitting: Family Medicine

## 2017-11-14 ENCOUNTER — Other Ambulatory Visit: Payer: Self-pay | Admitting: Family Medicine

## 2017-11-14 ENCOUNTER — Emergency Department
Admission: EM | Admit: 2017-11-14 | Discharge: 2017-11-14 | Disposition: A | Discharge status: Home / Self Care | Payer: Self-pay | Attending: Emergency Medicine | Admitting: Emergency Medicine

## 2017-11-14 ENCOUNTER — Ambulatory Visit (INDEPENDENT_AMBULATORY_CARE_PROVIDER_SITE_OTHER): Payer: Self-pay | Admitting: Family Medicine

## 2017-11-14 ENCOUNTER — Telehealth: Payer: Self-pay | Admitting: Family Medicine

## 2017-11-14 ENCOUNTER — Encounter: Payer: Self-pay | Admitting: Family Medicine

## 2017-11-14 ENCOUNTER — Encounter: Payer: Self-pay | Admitting: Emergency Medicine

## 2017-11-14 ENCOUNTER — Ambulatory Visit
Admission: RE | Admit: 2017-11-14 | Discharge: 2017-11-14 | Disposition: A | Discharge status: Home / Self Care | Payer: Self-pay | Source: Ambulatory Visit | Attending: Family Medicine | Admitting: Family Medicine

## 2017-11-14 ENCOUNTER — Emergency Department: Payer: Self-pay

## 2017-11-14 ENCOUNTER — Other Ambulatory Visit: Payer: Self-pay

## 2017-11-14 VITALS — BP 118/70 | HR 100 | Temp 98.8°F | Resp 18 | Ht 67.0 in | Wt 222.4 lb

## 2017-11-14 DIAGNOSIS — N200 Calculus of kidney: Secondary | ICD-10-CM

## 2017-11-14 DIAGNOSIS — Z87442 Personal history of urinary calculi: Secondary | ICD-10-CM | POA: Insufficient documentation

## 2017-11-14 DIAGNOSIS — I10 Essential (primary) hypertension: Secondary | ICD-10-CM

## 2017-11-14 DIAGNOSIS — R109 Unspecified abdominal pain: Secondary | ICD-10-CM

## 2017-11-14 DIAGNOSIS — R350 Frequency of micturition: Secondary | ICD-10-CM | POA: Insufficient documentation

## 2017-11-14 DIAGNOSIS — Z79899 Other long term (current) drug therapy: Secondary | ICD-10-CM | POA: Insufficient documentation

## 2017-11-14 DIAGNOSIS — J449 Chronic obstructive pulmonary disease, unspecified: Secondary | ICD-10-CM | POA: Insufficient documentation

## 2017-11-14 DIAGNOSIS — I5020 Unspecified systolic (congestive) heart failure: Secondary | ICD-10-CM | POA: Insufficient documentation

## 2017-11-14 DIAGNOSIS — E119 Type 2 diabetes mellitus without complications: Secondary | ICD-10-CM | POA: Insufficient documentation

## 2017-11-14 DIAGNOSIS — N401 Enlarged prostate with lower urinary tract symptoms: Secondary | ICD-10-CM | POA: Insufficient documentation

## 2017-11-14 DIAGNOSIS — Z87891 Personal history of nicotine dependence: Secondary | ICD-10-CM | POA: Insufficient documentation

## 2017-11-14 DIAGNOSIS — N400 Enlarged prostate without lower urinary tract symptoms: Secondary | ICD-10-CM

## 2017-11-14 DIAGNOSIS — Z7982 Long term (current) use of aspirin: Secondary | ICD-10-CM | POA: Insufficient documentation

## 2017-11-14 DIAGNOSIS — N132 Hydronephrosis with renal and ureteral calculous obstruction: Secondary | ICD-10-CM | POA: Insufficient documentation

## 2017-11-14 DIAGNOSIS — I110 Hypertensive heart disease with heart failure: Secondary | ICD-10-CM | POA: Insufficient documentation

## 2017-11-14 LAB — URINALYSIS, COMPLETE (UACMP) WITH MICROSCOPIC
Bacteria, UA: NONE SEEN
Bilirubin Urine: NEGATIVE
Glucose, UA: NEGATIVE mg/dL
Hgb urine dipstick: NEGATIVE
Ketones, ur: NEGATIVE mg/dL
Nitrite: NEGATIVE
PH: 5 (ref 5.0–8.0)
Protein, ur: NEGATIVE mg/dL
SPECIFIC GRAVITY, URINE: 1.01 (ref 1.005–1.030)

## 2017-11-14 LAB — COMPREHENSIVE METABOLIC PANEL
ALBUMIN: 3.9 g/dL (ref 3.5–5.0)
ALK PHOS: 60 U/L (ref 38–126)
ALT: 22 U/L (ref 0–44)
AST: 20 U/L (ref 15–41)
Anion gap: 7 (ref 5–15)
BILIRUBIN TOTAL: 0.8 mg/dL (ref 0.3–1.2)
BUN: 13 mg/dL (ref 6–20)
CALCIUM: 8.8 mg/dL — AB (ref 8.9–10.3)
CO2: 24 mmol/L (ref 22–32)
Chloride: 104 mmol/L (ref 98–111)
Creatinine, Ser: 1.2 mg/dL (ref 0.61–1.24)
GFR calc Af Amer: >60 mL/min (ref >60)
GFR calc non Af Amer: >60 mL/min (ref >60)
GLUCOSE: 149 mg/dL — AB (ref 70–99)
Potassium: 3.9 mmol/L (ref 3.5–5.1)
SODIUM: 135 mmol/L (ref 135–145)
TOTAL PROTEIN: 6.9 g/dL (ref 6.5–8.1)

## 2017-11-14 LAB — CBC WITH DIFFERENTIAL/PLATELET
Basophils Absolute: 0.2 10*3/uL — ABNORMAL HIGH (ref 0–0.1)
Basophils Relative: 1 %
EOS ABS: 0.1 10*3/uL (ref 0–0.7)
EOS PCT: 1 %
HCT: 36.4 % — ABNORMAL LOW (ref 40.0–52.0)
Hemoglobin: 12.3 g/dL — ABNORMAL LOW (ref 13.0–18.0)
LYMPHS ABS: 3.5 10*3/uL (ref 1.0–3.6)
Lymphocytes Relative: 19 %
MCH: 24.5 pg — AB (ref 26.0–34.0)
MCHC: 33.9 g/dL (ref 32.0–36.0)
MCV: 72.4 fL — ABNORMAL LOW (ref 80.0–100.0)
MONOS PCT: 8 %
Monocytes Absolute: 1.5 10*3/uL — ABNORMAL HIGH (ref 0.2–1.0)
Neutro Abs: 12.7 10*3/uL — ABNORMAL HIGH (ref 1.4–6.5)
Neutrophils Relative %: 71 %
PLATELETS: 391 10*3/uL (ref 150–440)
RBC: 5.03 MIL/uL (ref 4.40–5.90)
RDW: 16.5 % — ABNORMAL HIGH (ref 11.5–14.5)
WBC: 17.9 10*3/uL — AB (ref 3.8–10.6)

## 2017-11-14 LAB — POCT URINALYSIS DIPSTICK
Bilirubin, UA: NEGATIVE
Glucose, UA: NEGATIVE
KETONES UA: NEGATIVE
NITRITE UA: NEGATIVE
PH UA: 6 (ref 5.0–8.0)
PROTEIN UA: NEGATIVE
Spec Grav, UA: <=1.005 — AB (ref 1.010–1.025)
UROBILINOGEN UA: NEGATIVE U/dL — AB

## 2017-11-14 MED ORDER — AMLODIPINE BESY-BENAZEPRIL HCL 5-40 MG PO CAPS: 1.0000 | ORAL_CAPSULE | Freq: Every day | ORAL | 0 refills | Status: DC

## 2017-11-14 MED ORDER — KETOROLAC TROMETHAMINE 60 MG/2ML IM SOLN
60.0000 mg | Freq: Once | INTRAMUSCULAR | Status: AC
  Administered 2017-11-14: 60 mg via INTRAMUSCULAR
  Filled 2017-11-14: qty 2

## 2017-11-14 MED ORDER — CARVEDILOL 12.5 MG PO TABS: 12.5000 mg | ORAL_TABLET | Freq: Two times a day (BID) | ORAL | 0 refills | Status: DC

## 2017-11-14 NOTE — Progress Notes (Signed)
yell[place]

## 2017-11-14 NOTE — ED Triage Notes (Addendum)
Patient ambulatory to triage with steady gait, without difficulty or distress noted; pt reports having left flank pain & swelling since Wednesday, worse since last night; seen PCP today and dx with hematuria; st some decreased urinary output; has appt with urology tomorrow; st hx kidney stones

## 2017-11-14 NOTE — Telephone Encounter (Signed)
Copied from [place] [id number]. Topic: Quick Communication - See Telephone Encounter >> Nov 14, 2017  4:57 PM Blase Mess A wrote: CRM for notification. See Telephone encounter for: 11/14/17. Jonelle Sidle is calling to notify Raelyn Ensign that the [facility] is available for the patient from the imaging center.

## 2017-11-14 NOTE — ED Notes (Signed)
Pt ambulatory to POV without difficulty. Discharge instructions RX and follow up reviewed with interpreter. All questions and concern addressed.

## 2017-11-14 NOTE — ED Provider Notes (Signed)
[facility] Emergency Department Provider Note   ____________________________________________   I have reviewed the triage vital signs and the nursing notes.   HISTORY  Chief Complaint Flank Pain   History limited by: Not Limited   HPI Tim Anderson is a 52 y.o. male who presents to the emergency department today because of concerns for left flank pain.  Patient states the pain started becoming bad yesterday.  Describes as being in the left flank and radiated down towards his groin.  He states it is now both in the left and right lower abdomen.  He has had some issues with some difficulty initiating urinary stream sounder denies any bad odor or painful urination.  Does have a history of kidney stones.  Denies any nausea vomiting or fevers.   Per medical record review patient has a history of kidney stones.  Past Medical History:  Diagnosis Date  . Allergy   . BPH (benign prostatic hyperplasia)   . COPD (chronic obstructive pulmonary disease) ([place])   . Drug abuse ([place])   . Dysmetabolic syndrome X   . Erosive gastritis   . History of left hip replacement 12/27/2016  . Hyperlipidemia   . Hypertension   . Hypogonadism male   . Obesity   . Obsessive compulsive disorder   . OSA (obstructive sleep apnea)   . Radiculitis   . Stomatitis monilial   . Thrombocytosis ([place])     Patient Active Problem List   Diagnosis Date Noted  . Mild left ventricular systolic dysfunction 78/93/8101  . Grade II diastolic dysfunction 75/11/2583  . Mild mitral regurgitation 07/05/2017  . Mild tricuspid regurgitation 07/05/2017  . 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 History of left hip replacement 12/27/2016  . Angina pectoris ([place]) 10/27/2016  . Carpal tunnel syndrome on both sides 04/07/2016  . Primary osteoarthritis involving multiple joints 07/29/2015  . Arthritis of knee, degenerative 02/03/2015  . Dyslipidemia associated with type 2  diabetes mellitus ([place]) 01/27/2015  . Benign fibroma of prostate 09/28/2014  . Bronchitis with chronic airway obstruction ([place]) 09/28/2014  . Drug abuse ([place]) 09/28/2014  . Deflected nasal septum 09/28/2014  . Dyslipidemia 09/28/2014  . Dysmetabolic syndrome 27/78/2423  . GERD (gastroesophageal reflux disease) 09/28/2014  . H/O renal calculi 09/28/2014  . Benign hypertension 09/28/2014  . Male hypogonadism 09/28/2014  . Failure of erection 09/28/2014  . Obstructive apnea 09/28/2014  . Depression with anxiety 09/28/2014  . Obesity (BMI 30-39.9) 09/28/2014  . Migraine without aura and responsive to treatment 09/28/2014  . Perennial allergic rhinitis with seasonal variation 09/28/2014  . Elevated platelet count 09/28/2014  . Tobacco abuse 09/28/2014  . Cerebrovascular accident (CVA) ([place]) 07/27/2013  . History of TIA (transient ischemic attack) 09/20/2011  . Rheumatoid arthritis involving multiple joints ([place]) 07/31/2009    Past Surgical History:  Procedure Laterality Date  . FACIAL RECONSTRUCTION SURGERY    . KNEE SURGERY Right     Prior to Admission medications   Medication Sig Start Date End Date Taking? Authorizing Provider  amLODipine-benazepril (LOTREL) 5-40 MG capsule Take 1 capsule by mouth daily. 11/14/17   Hubbard Hartshorn, FNP  aspirin EC 81 MG tablet Take 81 mg by mouth. Reported on 07/29/2015 10/26/11   [provider]  carvedilol (COREG) 12.5 MG tablet Take 1 tablet (12.5 mg total) by mouth 2 (two) times daily with a meal. 11/14/17   Hubbard Hartshorn, FNP  diclofenac sodium (VOLTAREN) 1 % GEL APPLY 4 GRAMS TOPICALLY 4 TIMES  A DAY. 06/13/17   Steele Sizer, MD  Fluticasone-Umeclidin-Vilant (TRELEGY ELLIPTA) 100-62.5-25 MCG/INH AEPB Take 1 puff by mouth daily. 05/10/17   Steele Sizer, MD  hydroxychloroquine (PLAQUENIL) 200 MG tablet Take 1 tablet by mouth daily. 01/15/16   Emmaline Kluver., MD  ketorolac (TORADOL) 10 MG tablet Take 1 tablet (10 mg total) by  mouth every 6 (six) hours as needed. 05/10/17   Steele Sizer, MD  nitroGLYCERIN (NITROSTAT) 0.4 MG SL tablet Place under the tongue. 04/23/16 04/23/17  [provider]  omeprazole (PRILOSEC) 20 MG capsule Take 1 capsule (20 mg total) by mouth daily. 10/31/17   Steele Sizer, MD  promethazine (PHENERGAN) 12.5 MG tablet Take 1 tablet (12.5 mg total) by mouth every 8 (eight) hours as needed for nausea or vomiting. 05/10/17   Steele Sizer, MD  rizatriptan (MAXALT) 10 MG tablet Take 1 tablet (10 mg total) by mouth as needed for migraine. 10/27/16   Steele Sizer, MD  tamsulosin (FLOMAX) 0.4 MG CAPS capsule TAKE ONE CAPSULE DAILY. 02/17/16   Steele Sizer, MD    Allergies Ciprofloxacin; Crestor [rosuvastatin calcium]; Droperidol; Lipofen  [fenofibrate]; and Niacin er  Family History  Problem Relation Age of Onset  . Diabetes Mother   . Diabetes Father   . Heart attack Father   . Cancer Brother        Skin and Prostate-Oldest Brother  . Diabetes Brother     Social History Social History   Tobacco Use  . Smoking status: Former Smoker    Packs/day: 0.75    Years: 39.00    Pack years: 29.25    Types: Cigarettes    Start date: 09/30/1976    Last attempt to quit: 12/14/2016    Years since quitting: 0.9  . Smokeless tobacco: Never Used  Substance Use Topics  . Alcohol use: Yes    Alcohol/week: 0.0 standard drinks    Comment: occasionally  . Drug use: Yes    Types: Marijuana    Comment: used to use cocaine    Review of Systems Constitutional: No fever/chills Eyes: No visual changes. ENT: No sore throat. Cardiovascular: Denies chest pain. Respiratory: Denies shortness of breath. Gastrointestinal: Positive for left flank pain. Genitourinary: Positive for difficulty initiating stream. Musculoskeletal: Negative for back pain. Skin: Negative for rash. Neurological: Negative for headaches, focal weakness or  numbness.  ____________________________________________   PHYSICAL EXAM:  VITAL SIGNS: ED Triage Vitals  Enc Vitals Group     BP 11/14/17 1917 (!) 142/95     Pulse Rate 11/14/17 1917 80     Resp 11/14/17 1917 18     Temp 11/14/17 1917 98 F (36.7 C)     Temp Source 11/14/17 1917 Oral     SpO2 11/14/17 1917 97 %     Weight 11/14/17 1918 222 lb (100.7 kg)     Height 11/14/17 1918 5\' 7"  (1.702 m)     Head Circumference --      Peak Flow --      Pain Score 11/14/17 1917 10   Constitutional: Alert and oriented.  Eyes: Conjunctivae are normal.  ENT      Head: Normocephalic and atraumatic.      Nose: No congestion/rhinnorhea.      Mouth/Throat: Mucous membranes are moist.      Neck: No stridor. Hematological/Lymphatic/Immunilogical: No cervical lymphadenopathy. Cardiovascular: Normal rate, regular rhythm.  No murmurs, rubs, or gallops.  Respiratory: Normal respiratory effort without tachypnea nor retractions. Breath sounds are clear and equal bilaterally.  No wheezes/rales/rhonchi. Gastrointestinal: Soft and non tender. No rebound. No guarding.  Genitourinary: Deferred Musculoskeletal: Normal range of motion in all extremities. No lower extremity edema. Neurologic:  Normal speech and language. No gross focal neurologic deficits are appreciated.  Skin:  Skin is warm, dry and intact. No rash noted. Psychiatric: Mood and affect are normal. Speech and behavior are normal. Patient exhibits appropriate insight and judgment.  ____________________________________________    LABS (pertinent positives/negatives)  CBC wbc 17.9, hgb 12.3, plt 391 CMP wnl except glu 149, ca 8.8 UA clear, negative hgb dipstick, trace leukocytes, 0-5 rbc, 11-20 wbc  ____________________________________________   EKG  None  ____________________________________________    RADIOLOGY  CT renal Distal left sided ureteral  stone  ____________________________________________   PROCEDURES  Procedures  ____________________________________________   INITIAL IMPRESSION / ASSESSMENT AND PLAN / ED COURSE  Pertinent labs & imaging results that were available during my care of the patient were reviewed by me and considered in my medical decision making (see chart for details).   Patient presented to the emergency department today because of concerns for left flank pain.  Patient does have a history of kidney stones.  Urine however without any red blood cells.  There were some white blood cells.  CT renal stone was obtained to evaluate.  This did show some distal left ureteral stones.  Patient afebrile here.  Patient does have appointment with urology tomorrow.  Will give patient dose of Toradol and instructed patient to take double dose of flomax tonight and plan on having patient follow-up with urology tomorrow.  Discussed return precautions.   ____________________________________________   FINAL CLINICAL IMPRESSION(S) / ED DIAGNOSES  Final diagnoses:  Left flank pain  Kidney stone     Note: This dictation was prepared with Dragon dictation. Any transcriptional errors that result from this process are unintentional     Nance Pear, MD 11/14/17 2135

## 2017-11-14 NOTE — Discharge Instructions (Addendum)
Please seek medical attention for any high fevers, chest pain, shortness of breath, change in behavior, persistent vomiting, bloody stool or any other new or concerning symptoms.  

## 2017-11-14 NOTE — Progress Notes (Signed)
Name: Barclay Lennox   MRN: [id number]    DOB: 1965-04-23   Date:11/14/2017       Progress Note  Subjective  Chief Complaint  Chief Complaint  Patient presents with  . Flank Pain    possible kidney stones    HPI  Pt presents with concern for LEFT flank pain that started about a week ago.  He reports history of kidney stones in the past - has been having trouble urinating.  He was unable to empty his bladder yesterday at 5pm until this morning at 4:00am - is able to urinate today as he normally does.  Taking flomax daily already for BPH - often has difficulty emptying his bladder.  He endorses left lower quadrant discomfort as well.  He denies hematuria - urine has been pale and clear.  Denies fevers or chills.  Endorses some nausea and some constipation. Has seen Dr. Jacqlyn Larsen in the past - has not seen any other urologist in over a year.  Pt requests his BP meds to be refilled today as well - discussed with PCP Dr. Ancil Boozer - 30-day supply is provided, BP is at goal.  Appt made for December 08, 2017 for routine follow up.  Patient Active Problem List   Diagnosis Date Noted  . Mild left ventricular systolic dysfunction 88/32/5498  . Grade II diastolic dysfunction 26/41/5830  . Mild mitral regurgitation 07/05/2017  . Mild tricuspid regurgitation 07/05/2017  . Essential hemorrhagic thrombocythemia ([place]) 05/10/2017  . History of left hip replacement 12/27/2016  . Angina pectoris ([place]) 10/27/2016  . Carpal tunnel syndrome on both sides 04/07/2016  . Primary osteoarthritis involving multiple joints 07/29/2015  . Arthritis of knee, degenerative 02/03/2015  . Dyslipidemia associated with type 2 diabetes mellitus ([place]) 01/27/2015  . Benign fibroma of prostate 09/28/2014  . Bronchitis with chronic airway obstruction ([place]) 09/28/2014  . Drug abuse ([place]) 09/28/2014  . Deflected nasal septum 09/28/2014  . Dyslipidemia 09/28/2014  . Dysmetabolic syndrome 94/08/6806  . GERD (gastroesophageal  reflux disease) 09/28/2014  . H/O renal calculi 09/28/2014  . Benign hypertension 09/28/2014  . Male hypogonadism 09/28/2014  . Failure of erection 09/28/2014  . Obstructive apnea 09/28/2014  . Depression with anxiety 09/28/2014  . Obesity (BMI 30-39.9) 09/28/2014  . Migraine without aura and responsive to treatment 09/28/2014  . Perennial allergic rhinitis with seasonal variation 09/28/2014  . Elevated platelet count 09/28/2014  . Tobacco abuse 09/28/2014  . Cerebrovascular accident (CVA) ([place]) 07/27/2013  . History of TIA (transient ischemic attack) 09/20/2011  . Rheumatoid arthritis involving multiple joints ([place]) 07/31/2009    Social History   Tobacco Use  . Smoking status: Former Smoker    Packs/day: 0.75    Years: 39.00    Pack years: 29.25    Types: Cigarettes    Start date: 09/30/1976    Last attempt to quit: 12/14/2016    Years since quitting: 0.9  . Smokeless tobacco: Never Used  Substance Use Topics  . Alcohol use: Yes    Alcohol/week: 0.0 standard drinks    Comment: occasionally     Current Outpatient Medications:  .  amLODipine-benazepril (LOTREL) 5-40 MG capsule, Take 1 capsule by mouth daily., Disp: 30 capsule, Rfl: 0 .  aspirin EC 81 MG tablet, Take 81 mg by mouth. Reported on 07/29/2015, Disp: , Rfl:  .  carvedilol (COREG) 12.5 MG tablet, Take 1 tablet (12.5 mg total) by mouth 2 (two) times daily with a meal., Disp: 30 tablet, Rfl: 0 .  diclofenac sodium (VOLTAREN) 1 % GEL, APPLY 4 GRAMS TOPICALLY 4 TIMES A DAY., Disp: 300 g, Rfl: 0 .  Fluticasone-Umeclidin-Vilant (TRELEGY ELLIPTA) 100-62.5-25 MCG/INH AEPB, Take 1 puff by mouth daily., Disp: 60 each, Rfl: 5 .  hydroxychloroquine (PLAQUENIL) 200 MG tablet, Take 1 tablet by mouth daily., Disp: , Rfl:  .  ketorolac (TORADOL) 10 MG tablet, Take 1 tablet (10 mg total) by mouth every 6 (six) hours as needed., Disp: 40 tablet, Rfl: 0 .  omeprazole (PRILOSEC) 20 MG capsule, Take 1 capsule (20 mg total) by mouth  daily., Disp: 30 capsule, Rfl: 0 .  promethazine (PHENERGAN) 12.5 MG tablet, Take 1 tablet (12.5 mg total) by mouth every 8 (eight) hours as needed for nausea or vomiting., Disp: 20 tablet, Rfl: 0 .  rizatriptan (MAXALT) 10 MG tablet, Take 1 tablet (10 mg total) by mouth as needed for migraine., Disp: 10 tablet, Rfl: 0 .  tamsulosin (FLOMAX) 0.4 MG CAPS capsule, TAKE ONE CAPSULE DAILY., Disp: 30 capsule, Rfl: 0 .  nitroGLYCERIN (NITROSTAT) 0.4 MG SL tablet, Place under the tongue., Disp: , Rfl:   Allergies  Allergen Reactions  . Ciprofloxacin Other (See Comments)    Body aches  . Crestor [Rosuvastatin Calcium] Other (See Comments)    Muscles aches  . Droperidol Other (See Comments)    Heart stopped  . Lipofen  [Fenofibrate]     muscle aches muscle aches  . Niacin Er     flushed    I personally reviewed active problem list, medication list, allergies with the patient/caregiver today.  ROS  Ten systems reviewed and is negative except as mentioned in HPI.  Objective  Vitals:   11/14/17 1510  BP: 118/70  Pulse: 100  Resp: 18  Temp: 98.8 F (37.1 C)  TempSrc: Oral  SpO2: 99%  Weight: 222 lb 6.4 oz (100.9 kg)  Height: 5\' 7"  (1.702 m)   Body mass index is 34.83 kg/m.  Nursing Note and Vital Signs reviewed.  Physical Exam  Constitutional: Patient appears well-developed and well-nourished. No distress.  HENT: Head: Normocephalic and atraumatic.  Neck: Normal range of motion. Neck supple. No JVD present. No thyromegaly present.  Cardiovascular: Normal rate, regular rhythm and normal heart sounds.  No murmur heard. No BLE edema. Pulmonary/Chest: Effort normal and breath sounds normal. No respiratory distress. Abdominal: Soft. Bowel sounds are normal, no distension. There is mild LLQ tenderness. No masses. No CVA tenderness. Musculoskeletal: Normal range of motion, no joint effusions. No gross deformities Neurological: Pt is alert and oriented to person, place, and time. No  cranial nerve deficit. Coordination, balance, strength, speech and gait are normal.  Skin: Skin is warm and dry. No rash noted. No erythema.  Psychiatric: Patient has a normal mood and affect. behavior is normal. Judgment and thought content normal.  Results for orders placed or performed in visit on 11/14/17 (from the past 72 hour(s))  POCT urinalysis dipstick     Status: Abnormal   Collection Time: 11/14/17  3:21 PM  Result Value Ref Range   Color, UA yellow    Clarity, UA clear    Glucose, UA Negative Negative   Bilirubin, UA negative    Ketones, UA negative    Spec Grav, UA <=1.005 (A) 1.010 - 1.025   Blood, UA large    pH, UA 6.0 5.0 - 8.0   Protein, UA Negative Negative   Urobilinogen, UA negative (A) 0.2 or 1.0 E.U./dL   Nitrite, UA negative    Leukocytes, UA  Trace (A) Negative   Appearance clear    Odor      Assessment & Plan  1. Frequent urination - Advised if retention recurrs and lasts longer than 6-8 hours, needs to present for ER care. - POCT urinalysis dipstick - Urine Culture - Ambulatory referral to Urology - DG Abd 1 View; Future - DG Abd 1 View  2. Left flank pain - Urine Culture - Ambulatory referral to Urology - DG Abd 1 View; Future - DG Abd 1 View  3. Benign fibroma of prostate - Continue Flomax  4. H/O renal calculi - Urine Culture - Ambulatory referral to Urology - DG Abd 1 View; Future - DG Abd 1 View  5. Essential hypertension - carvedilol (COREG) 12.5 MG tablet; Take 1 tablet (12.5 mg total) by mouth 2 (two) times daily with a meal.  Dispense: 30 tablet; Refill: 0 - amLODipine-benazepril (LOTREL) 5-40 MG capsule; Take 1 capsule by mouth daily.  Dispense: 30 capsule; Refill: 0 - At goal, routine follow up is made for October 24 with PCP Dr. Ancil Boozer  -Red flags and when to present for emergency care or RTC including fever >101.32F, chest pain, shortness of breath, new/worsening/un-resolving symptoms, frank hematuria, fevers, vomiting, blood  in stool, or urinary retention reviewed with patient at time of visit. Follow up and care instructions discussed and provided in AVS.

## 2017-11-14 NOTE — Patient Instructions (Signed)
-   Please go to [facility] this afternoon for Xray  - Please go to [facility] at 2:15pm tomorrow - this is located in the [facility].

## 2017-11-15 ENCOUNTER — Encounter: Payer: Self-pay | Admitting: Urology

## 2017-11-15 ENCOUNTER — Ambulatory Visit (INDEPENDENT_AMBULATORY_CARE_PROVIDER_SITE_OTHER): Payer: Self-pay | Admitting: Urology

## 2017-11-15 VITALS — BP 123/76 | HR 90 | Ht 68.0 in | Wt 219.5 lb

## 2017-11-15 DIAGNOSIS — R3129 Other microscopic hematuria: Secondary | ICD-10-CM

## 2017-11-15 DIAGNOSIS — N132 Hydronephrosis with renal and ureteral calculous obstruction: Secondary | ICD-10-CM

## 2017-11-15 DIAGNOSIS — N201 Calculus of ureter: Secondary | ICD-10-CM

## 2017-11-15 LAB — URINE CULTURE
MICRO NUMBER: 91174123
RESULT: NO GROWTH
SPECIMEN QUALITY:: ADEQUATE

## 2017-11-15 MED ORDER — KETOROLAC TROMETHAMINE 10 MG PO TABS: 10.0000 mg | ORAL_TABLET | Freq: Four times a day (QID) | ORAL | 0 refills | Status: DC | PRN

## 2017-11-15 MED ORDER — AMOXICILLIN-POT CLAVULANATE 875-125 MG PO TABS: 1.0000 | ORAL_TABLET | Freq: Two times a day (BID) | ORAL | 0 refills | Status: DC

## 2017-11-15 NOTE — Progress Notes (Signed)
11/15/2017 3:17 PM   Tim Anderson 02/13/1966 [id number]  Referring provider: Steele Sizer, MD [address] [place], [place] [address]  Chief Complaint  Patient presents with  . Nephrolithiasis    HPI: Patient is a 52 year old Caucasian male who was referred by [facility] ED for nephrolithiasis.    He presented to the emergency room on November 14, 2017 with a complaint of left-sided flank pain that radiated down into his groin associated with difficulty initiating his urinary stream.   CT Renal stone study noted 6 mm distal left ureteral calculus or 2 adjacent calculi at the ureterovesical junction, causing moderate left hydronephrosis and hydroureter.  Tiny, nonobstructing mid left renal calculus.   Labs in the ED:  UA was positive for 11-20 WBC's and 0-5 RBC's.  His urine culture was negative.  Serum creatinine was 1.20.  WBC count was 17.9.     Meds given in the ED: Toradol IM  Prior urological history:  Was followed by Dr. Jacqlyn Larsen for BPH and nephrolithiasis.  [facility] 2005.     Current NSAID/anticoagulation:   Toradol po   Today, he is experiencing frequency, urgency, nocturia, hesitancy, straining to urinate and a weak urinary stream.  Patient denies any gross hematuria, dysuria or suprapubic/flank pain.  Patient denies any fevers, chills, nausea or vomiting.   PMH: Past Medical History:  Diagnosis Date  . Allergy   . BPH (benign prostatic hyperplasia)   . COPD (chronic obstructive pulmonary disease) ([place])   . Drug abuse ([place])   . Dysmetabolic syndrome X   . Erosive gastritis   . History of hip replacement 12/2016  . History of knee replacement 01/2015  . History of left hip replacement 12/27/2016  . Hyperlipidemia   . Hypertension   . Hypogonadism male   . Obesity   . Obsessive compulsive disorder   . OSA (obstructive sleep apnea)   . Radiculitis   . Stomatitis monilial   . Thrombocytosis ([place])     Surgical History: Past Surgical History:    Procedure Laterality Date  . FACIAL RECONSTRUCTION SURGERY    . KNEE SURGERY Right     Home Medications:  Allergies as of 11/15/2017      Reactions   Ciprofloxacin Other (See Comments)   Body aches   Crestor [rosuvastatin Calcium] Other (See Comments)   Muscles aches   Droperidol Other (See Comments)   Heart stopped   Lipofen  [fenofibrate]    muscle aches muscle aches   Niacin Er    flushed      Medication List        Accurate as of 11/15/17  3:17 PM. Always use your most recent med list.          amLODipine-benazepril 5-40 MG capsule Commonly known as:  LOTREL Take 1 capsule by mouth daily.   amoxicillin-clavulanate 875-125 MG tablet Commonly known as:  AUGMENTIN Take 1 tablet by mouth every 12 (twelve) hours.   aspirin EC 81 MG tablet Take 81 mg by mouth. Reported on 07/29/2015   carvedilol 12.5 MG tablet Commonly known as:  COREG Take 1 tablet (12.5 mg total) by mouth 2 (two) times daily with a meal.   diclofenac sodium 1 % Gel Commonly known as:  VOLTAREN APPLY 4 GRAMS TOPICALLY 4 TIMES A DAY.   Fluticasone-Umeclidin-Vilant 100-62.5-25 MCG/INH Aepb Take 1 puff by mouth daily.   hydroxychloroquine 200 MG tablet Commonly known as:  PLAQUENIL Take 1 tablet by mouth daily.   ketorolac  10 MG tablet Commonly known as:  TORADOL Take 1 tablet (10 mg total) by mouth every 6 (six) hours as needed.   nitroGLYCERIN 0.4 MG SL tablet Commonly known as:  NITROSTAT Place under the tongue.   omeprazole 20 MG capsule Commonly known as:  PRILOSEC Take 1 capsule (20 mg total) by mouth daily.   promethazine 12.5 MG tablet Commonly known as:  PHENERGAN Take 1 tablet (12.5 mg total) by mouth every 8 (eight) hours as needed for nausea or vomiting.   rizatriptan 10 MG tablet Commonly known as:  MAXALT Take 1 tablet (10 mg total) by mouth as needed for migraine.   tamsulosin 0.4 MG Caps capsule Commonly known as:  FLOMAX TAKE ONE CAPSULE DAILY.        Allergies:  Allergies  Allergen Reactions  . Ciprofloxacin Other (See Comments)    Body aches  . Crestor [Rosuvastatin Calcium] Other (See Comments)    Muscles aches  . Droperidol Other (See Comments)    Heart stopped  . Lipofen  [Fenofibrate]     muscle aches muscle aches  . Niacin Er     flushed    Family History: Family History  Problem Relation Age of Onset  . Diabetes Mother   . Diabetes Father   . Heart attack Father   . Cancer Brother        Skin and Prostate-Oldest Brother  . Diabetes Brother     Social History:  reports that he has been smoking cigarettes. He started smoking about 41 years ago. He has a 39.00 pack-year smoking history. He has never used smokeless tobacco. He reports that he drinks alcohol. He reports that he has current or past drug history. Drug: Marijuana.  ROS: UROLOGY Frequent Urination?: Yes Hard to postpone urination?: Yes Burning/pain with urination?: No Get up at night to urinate?: Yes Leakage of urine?: No Urine stream starts and stops?: No Trouble starting stream?: Yes Do you have to strain to urinate?: Yes Blood in urine?: No Urinary tract infection?: No Sexually transmitted disease?: No Injury to kidneys or bladder?: No Painful intercourse?: No Weak stream?: Yes Erection problems?: Yes Penile pain?: No  Gastrointestinal Nausea?: No Vomiting?: No Indigestion/heartburn?: Yes Diarrhea?: No Constipation?: No  Constitutional Fever: No Night sweats?: No Weight loss?: No Fatigue?: Yes  Skin Skin rash/lesions?: No Itching?: No  Eyes Blurred vision?: Yes Double vision?: No  Ears/Nose/Throat Sore throat?: No Sinus problems?: Yes  Hematologic/Lymphatic Swollen glands?: No Easy bruising?: No  Cardiovascular Leg swelling?: No Chest pain?: No  Respiratory Cough?: Yes Shortness of breath?: No  Endocrine Excessive thirst?: No  Musculoskeletal Back pain?: Yes Joint pain?:  Yes  Neurological Headaches?: No Dizziness?: No  Psychologic Depression?: Yes Anxiety?: Yes  Physical Exam: BP 123/76 (BP Location: Left Arm, Patient Position: Sitting, Cuff Size: Large)   Pulse 90   Ht _0  (1.727 m)   Wt 219 lb 8 oz (99.6 kg)   BMI 33.37 kg/m   Constitutional:  Well nourished. Alert and oriented, No acute distress. HEENT: [place] AT, moist mucus membranes.  Trachea midline, no masses. Cardiovascular: No clubbing, cyanosis, or edema. Respiratory: Normal respiratory effort, no increased work of breathing. GI: Abdomen is soft, non tender, non distended, no abdominal masses. Liver and spleen not palpable.  No hernias appreciated.  Stool sample for occult testing is not indicated.   GU: No CVA tenderness.  No bladder fullness or masses.   Skin: No rashes, bruises or suspicious lesions. Lymph: No cervical or inguinal  adenopathy. Neurologic: Grossly intact, no focal deficits, moving all 4 extremities. Psychiatric: Normal mood and affect.  Laboratory Data: Lab Results  Component Value Date   WBC 17.9 (H) 11/14/2017   HGB 12.3 (L) 11/14/2017   HCT 36.4 (L) 11/14/2017   MCV 72.4 (L) 11/14/2017   PLT 391 11/14/2017    Lab Results  Component Value Date   CREATININE 1.20 11/14/2017    Lab Results  Component Value Date   PSA Normal 09/06/2013    No results found for: TESTOSTERONE  Lab Results  Component Value Date   HGBA1C 6.0 05/10/2017    Lab Results  Component Value Date   TSH 3.71 10/23/2012       Component Value Date/Time   CHOL 162 07/05/2016 0837   CHOL 160 08/29/2011 0453   HDL 30 (L) 07/05/2016 0837   HDL 18 (L) 08/29/2011 0453   CHOLHDL 5.4 (H) 07/05/2016 0837   VLDL 55 (H) 08/29/2011 0453   LDLCALC 99 07/05/2016 0837   LDLCALC 87 08/29/2011 0453    Lab Results  Component Value Date   AST 20 11/14/2017   Lab Results  Component Value Date   ALT 22 11/14/2017   No components found for: ALKALINEPHOPHATASE No components found  for: BILIRUBINTOTAL  No results found for: ESTRADIOL  Urinalysis    Component Value Date/Time   COLORURINE YELLOW (A) 11/14/2017 1920   APPEARANCEUR CLEAR (A) 11/14/2017 1920   APPEARANCEUR Clear 10/23/2012 1319   LABSPEC 1.010 11/14/2017 1920   LABSPEC 1.010 10/23/2012 1319   PHURINE 5.0 11/14/2017 1920   GLUCOSEU NEGATIVE 11/14/2017 1920   GLUCOSEU Negative 10/23/2012 1319   HGBUR NEGATIVE 11/14/2017 1920   BILIRUBINUR NEGATIVE 11/14/2017 1920   BILIRUBINUR negative 11/14/2017 1521   BILIRUBINUR Negative 10/23/2012 1319   KETONESUR NEGATIVE 11/14/2017 1920   PROTEINUR NEGATIVE 11/14/2017 1920   UROBILINOGEN negative (A) 11/14/2017 1521   NITRITE NEGATIVE 11/14/2017 1920   LEUKOCYTESUR TRACE (A) 11/14/2017 1920   LEUKOCYTESUR Negative 10/23/2012 1319    I have reviewed the labs.   Pertinent Imaging: CLINICAL DATA:  Left flank pain and swelling for the past 5 days, worse since last night. Some decreased urine output. History of kidney stones.  EXAM: CT ABDOMEN AND PELVIS WITHOUT CONTRAST  TECHNIQUE: Multidetector CT imaging of the abdomen and pelvis was performed following the standard protocol without IV contrast.  COMPARISON:  Abdomen and pelvis radiographs obtained earlier today. Abdomen and pelvis CT dated 03/04/2016.  FINDINGS: Lower chest: Interval 5 mm nodular density in the right lower lobe on image number 9 series 4.  Hepatobiliary: No focal liver abnormality is seen. No gallstones, gallbladder wall thickening, or biliary dilatation.  Pancreas: Unremarkable. No pancreatic ductal dilatation or surrounding inflammatory changes.  Spleen: Normal in size without focal abnormality.  Adrenals/Urinary Tract: Normal appearing adrenal glands, right kidney, right ureter and urinary bladder. Moderate dilatation of the left renal collecting system and ureter to the level of a 6 mm calculus or 2 adjacent calculi at the ureterovesical  junction. Associated periureteric soft tissue stranding and edema. Minimal perinephric soft tissue stranding. Tiny mid left renal calculus.  Stomach/Bowel: Multiple sigmoid and descending colon diverticula. Multiple small appendicoliths. Otherwise, normal appearing appendix. Normal appearing small bowel and stomach.  Vascular/Lymphatic: Atheromatous arterial calcifications without aneurysm. No enlarged lymph nodes.  Reproductive: Minimally enlarged prostate gland containing punctate calcifications.  Other: No abdominal wall hernia or abnormality. No abdominopelvic ascites.  Musculoskeletal: Left hip prosthesis. Lumbar spine degenerative changes. These include facet  degenerative changes at the L5-S1 level associated with mild, grade 1 anterolisthesis  IMPRESSION: 1. 6 mm distal left ureteral calculus or 2 adjacent calculi at the ureterovesical junction, causing moderate left hydronephrosis and hydroureter. 2. Tiny, nonobstructing mid left renal calculus. 3. Interval 5 mm nodular density in the right lower lobe. A follow-up chest CT without contrast is recommended in 6 months. 4. Extensive sigmoid and descending colon diverticulosis. 5. Mild prostatic hypertrophy.   Electronically Signed   By: Claudie Revering M.D.   On: 11/14/2017 20:54  I have independently reviewed the films.    Assessment & Plan:    1. Left ureteral stone Explained to the patient that since their stone is  ?10 mm, it is in within AUA Guidelines to continue MET with tamsulosin and pushing fluids for 4 to 6 weeks - he would like to continue the MET at this time as he is self pay  Patient is advised that if they should start to experience pain that is not able to be controlled with pain medication, intractable nausea and/or vomiting and/or fevers greater than 103 or shaking chills to contact the office immediately or seek treatment in the emergency department for emergent intervention.     2. Left  hydronephrosis  - obtain RUS to ensure the hydronephrosis has resolved once they have passed and/or recovered from procedure to ensure to iatrogenic hydronephrosis remains - it is explained to the patient that it is important to document resolution of the hydronephrosis as "silent hydronephrosis" can occur and cause damage and/or loss of the kidney  3. Microscopic hematuria  - continue to monitor the patient's UA after the treatment/passage of the stone to ensure the hematuria has resolved  - if hematuria persists, we will pursue a hematuria workup with CT Urogram and cystoscopy if appropriate.    Return in about 1 week (around 11/22/2017) for KUB and office visit .  These notes generated with voice recognition software. I apologize for typographical errors.  Zara Council, PA-C  [facility] [address]  [place], [place] [address] [phone]

## 2017-11-15 NOTE — Telephone Encounter (Signed)
See result note.  

## 2017-11-23 ENCOUNTER — Encounter: Payer: Self-pay | Admitting: Urology

## 2017-11-23 ENCOUNTER — Ambulatory Visit
Admission: RE | Admit: 2017-11-23 | Discharge: 2017-11-23 | Disposition: A | Discharge status: Home / Self Care | Payer: Self-pay | Source: Ambulatory Visit | Attending: Urology | Admitting: Urology

## 2017-11-23 ENCOUNTER — Other Ambulatory Visit: Payer: Self-pay

## 2017-11-23 ENCOUNTER — Ambulatory Visit (INDEPENDENT_AMBULATORY_CARE_PROVIDER_SITE_OTHER): Payer: Self-pay | Admitting: Urology

## 2017-11-23 VITALS — BP 127/76 | HR 89 | Ht 67.0 in | Wt 223.9 lb

## 2017-11-23 DIAGNOSIS — N201 Calculus of ureter: Secondary | ICD-10-CM

## 2017-11-23 DIAGNOSIS — N132 Hydronephrosis with renal and ureteral calculous obstruction: Secondary | ICD-10-CM

## 2017-11-23 DIAGNOSIS — R3129 Other microscopic hematuria: Secondary | ICD-10-CM

## 2017-11-23 DIAGNOSIS — N400 Enlarged prostate without lower urinary tract symptoms: Secondary | ICD-10-CM

## 2017-11-23 DIAGNOSIS — N529 Male erectile dysfunction, unspecified: Secondary | ICD-10-CM

## 2017-11-23 LAB — URINALYSIS, COMPLETE
BILIRUBIN UA: NEGATIVE
GLUCOSE, UA: NEGATIVE
Ketones, UA: NEGATIVE
LEUKOCYTES UA: NEGATIVE
Nitrite, UA: NEGATIVE
PH UA: 6.5 (ref 5.0–7.5)
Protein, UA: NEGATIVE
RBC, UA: NEGATIVE
SPEC GRAV UA: 1.015 (ref 1.005–1.030)
Urobilinogen, Ur: 0.2 mg/dL (ref 0.2–1.0)

## 2017-11-23 MED ORDER — TAMSULOSIN HCL 0.4 MG PO CAPS: 0.4000 mg | ORAL_CAPSULE | Freq: Every day | ORAL | 11 refills | Status: DC

## 2017-11-23 MED ORDER — SILDENAFIL CITRATE 20 MG PO TABS: ORAL_TABLET | ORAL | 3 refills | Status: DC

## 2017-11-23 NOTE — Progress Notes (Signed)
11/23/2017 4:12 PM   Tim Anderson Endo Jun 23, 1965 [id number]  Referring provider: Steele Sizer, MD [address] [place], [place] [address]  Chief Complaint  Patient presents with  . Follow-up    HPI: Patient is a 52 year old Caucasian male with a history of a left UVJ stone who presents today for 2-week follow-up.  Background history Patient is a 52 year old Caucasian male who was referred by [facility] ED for nephrolithiasis.  He presented to the emergency room on November 14, 2017 with a complaint of left-sided flank pain that radiated down into his groin associated with difficulty initiating his urinary stream.   CT Renal stone study noted 6 mm distal left ureteral calculus or 2 adjacent calculi at the ureterovesical junction, causing moderate left hydronephrosis and hydroureter.  Tiny, nonobstructing mid left renal calculus. Labs in the ED:  UA was positive for 11-20 WBC's and 0-5 RBC's.  His urine culture was negative.  Serum creatinine was 1.20.  WBC count was 17.9.   Meds given in the ED: Toradol IM Prior urological history:  Was followed by Dr. Jacqlyn Larsen for BPH and nephrolithiasis.  [facility] 2005.   Current NSAID/anticoagulation:   Toradol po  KUB today did not demonstrate any ureteral calculi.    He has not had pain since two days after his visit with [place].   Patient denies any gross hematuria, dysuria or suprapubic/flank pain.  Patient denies any fevers, chills, nausea or vomiting.     He is also in need of refills on sildenafil and tamsulosin as he is a former Dr. Jacqlyn Larsen patient and Dr. Jacqlyn Larsen has relocated.    PMH: Past Medical History:  Diagnosis Date  . Allergy   . BPH (benign prostatic hyperplasia)   . COPD (chronic obstructive pulmonary disease) ([place])   . Drug abuse ([place])   . Dysmetabolic syndrome X   . Erosive gastritis   . History of hip replacement 12/2016  . History of knee replacement 01/2015  . History of left hip replacement 12/27/2016  .  Hyperlipidemia   . Hypertension   . Hypogonadism male   . Obesity   . Obsessive compulsive disorder   . OSA (obstructive sleep apnea)   . Radiculitis   . Stomatitis monilial   . Thrombocytosis ([place])     Surgical History: Past Surgical History:  Procedure Laterality Date  . FACIAL RECONSTRUCTION SURGERY    . KNEE SURGERY Right     Home Medications:  Allergies as of 11/23/2017      Reactions   Ciprofloxacin Other (See Comments)   Body aches   Crestor [rosuvastatin Calcium] Other (See Comments)   Muscles aches   Droperidol Other (See Comments)   Heart stopped   Lipofen  [fenofibrate]    muscle aches muscle aches   Niacin Er    flushed      Medication List        Accurate as of 11/23/17  4:12 PM. Always use your most recent med list.          amLODipine-benazepril 5-40 MG capsule Commonly known as:  LOTREL Take 1 capsule by mouth daily.   amoxicillin-clavulanate 875-125 MG tablet Commonly known as:  AUGMENTIN Take 1 tablet by mouth every 12 (twelve) hours.   aspirin EC 81 MG tablet Take 81 mg by mouth. Reported on 07/29/2015   carvedilol 12.5 MG tablet Commonly known as:  COREG Take 1 tablet (12.5 mg total) by mouth 2 (two) times daily with a meal.  diclofenac sodium 1 % Gel Commonly known as:  VOLTAREN APPLY 4 GRAMS TOPICALLY 4 TIMES A DAY.   Fluticasone-Umeclidin-Vilant 100-62.5-25 MCG/INH Aepb Take 1 puff by mouth daily.   hydroxychloroquine 200 MG tablet Commonly known as:  PLAQUENIL Take 1 tablet by mouth daily.   ketorolac 10 MG tablet Commonly known as:  TORADOL Take 1 tablet (10 mg total) by mouth every 6 (six) hours as needed.   omeprazole 20 MG capsule Commonly known as:  PRILOSEC Take 1 capsule (20 mg total) by mouth daily.   promethazine 12.5 MG tablet Commonly known as:  PHENERGAN Take 1 tablet (12.5 mg total) by mouth every 8 (eight) hours as needed for nausea or vomiting.   rizatriptan 10 MG tablet Commonly known as:   MAXALT Take 1 tablet (10 mg total) by mouth as needed for migraine.   sildenafil 20 MG tablet Commonly known as:  REVATIO Take 3 to 5 tablets two hours before intercouse on an empty stomach.  Do not take with nitrates.   tamsulosin 0.4 MG Caps capsule Commonly known as:  FLOMAX Take 1 capsule (0.4 mg total) by mouth daily.       Allergies:  Allergies  Allergen Reactions  . Ciprofloxacin Other (See Comments)    Body aches  . Crestor [Rosuvastatin Calcium] Other (See Comments)    Muscles aches  . Droperidol Other (See Comments)    Heart stopped  . Lipofen  [Fenofibrate]     muscle aches muscle aches  . Niacin Er     flushed    Family History: Family History  Problem Relation Age of Onset  . Diabetes Mother   . Diabetes Father   . Heart attack Father   . Cancer Brother        Skin and Prostate-Oldest Brother  . Diabetes Brother     Social History:  reports that he has been smoking cigarettes. He started smoking about 41 years ago. He has a 39.00 pack-year smoking history. He has never used smokeless tobacco. He reports that he drinks alcohol. He reports that he has current or past drug history. Drug: Marijuana.  ROS: UROLOGY Frequent Urination?: No Hard to postpone urination?: No Burning/pain with urination?: No Get up at night to urinate?: No Leakage of urine?: No Urine stream starts and stops?: No Trouble starting stream?: No Do you have to strain to urinate?: No Blood in urine?: No Urinary tract infection?: No Sexually transmitted disease?: No Injury to kidneys or bladder?: No Painful intercourse?: No Weak stream?: No Erection problems?: No Penile pain?: No  Gastrointestinal Nausea?: No Vomiting?: No Indigestion/heartburn?: No Diarrhea?: No Constipation?: No  Constitutional Fever: No Night sweats?: No Weight loss?: No Fatigue?: No  Skin Skin rash/lesions?: No Itching?: No  Eyes Blurred vision?: No Double vision?:  No  Ears/Nose/Throat Sore throat?: No Sinus problems?: No  Hematologic/Lymphatic Swollen glands?: No  Cardiovascular Leg swelling?: No Chest pain?: No  Respiratory Cough?: No Shortness of breath?: No  Endocrine Excessive thirst?: No  Musculoskeletal Back pain?: No Joint pain?: No  Neurological Headaches?: No Dizziness?: No  Psychologic Depression?: No Anxiety?: No  Physical Exam: BP 127/76   Pulse 89   Ht 5\' 7"  (1.702 m)   Wt 223 lb 14.4 oz (101.6 kg)   BMI 35.07 kg/m   Constitutional: Well nourished. Alert and oriented, No acute distress. HEENT: [place] AT, moist mucus membranes. Trachea midline, no masses. Cardiovascular: No clubbing, cyanosis, or edema. Respiratory: Normal respiratory effort, no increased work of breathing. Skin:  No rashes, bruises or suspicious lesions. Lymph: No cervical or inguinal adenopathy. Neurologic: Grossly intact, no focal deficits, moving all 4 extremities. Psychiatric: Normal mood and affect.  Laboratory Data: Lab Results  Component Value Date   WBC 17.9 (H) 11/14/2017   HGB 12.3 (L) 11/14/2017   HCT 36.4 (L) 11/14/2017   MCV 72.4 (L) 11/14/2017   PLT 391 11/14/2017    Lab Results  Component Value Date   CREATININE 1.20 11/14/2017    Lab Results  Component Value Date   PSA Normal 09/06/2013    No results found for: TESTOSTERONE  Lab Results  Component Value Date   HGBA1C 6.0 05/10/2017    Lab Results  Component Value Date   TSH 3.71 10/23/2012       Component Value Date/Time   CHOL 162 07/05/2016 0837   CHOL 160 08/29/2011 0453   HDL 30 (L) 07/05/2016 0837   HDL 18 (L) 08/29/2011 0453   CHOLHDL 5.4 (H) 07/05/2016 0837   VLDL 55 (H) 08/29/2011 0453   LDLCALC 99 07/05/2016 0837   LDLCALC 87 08/29/2011 0453    Lab Results  Component Value Date   AST 20 11/14/2017   Lab Results  Component Value Date   ALT 22 11/14/2017   No components found for: ALKALINEPHOPHATASE No components found for:  BILIRUBINTOTAL  No results found for: ESTRADIOL  Urinalysis Negative.  See Epic.   I have reviewed the labs.   Pertinent Imaging: CLINICAL DATA:  Left-sided ureteral stone.  EXAM: ABDOMEN - 1 VIEW  COMPARISON:  Body CT 11/14/2017, abdominal radiograph 11/14/2017  FINDINGS: The bowel gas pattern is normal. No radio-opaque calculi or other significant radiographic abnormality are seen. Large amount of formed stool obscures the renal shadows. Phleboliths in the pelvis noted.  IMPRESSION: No definite renal or ureteral calculi seen radiographically.  Large amount of formed colonic stool largely obscures the renal shadows.   Electronically Signed   By: Fidela Salisbury M.D.   On: 11/24/2017 08:22 I have independently reviewed the films and the left UVJ stone is not visible on today's KUB   Assessment & Plan:    1. Left ureteral stone Likely passed  2. Left hydronephrosis Obtain RUS to ensure the hydronephrosis has resolved - it is explained to the patient that it is important to document resolution of the hydronephrosis as "silent hydronephrosis" can occur and cause damage and/or loss of the kidney  3. Microscopic hematuria UA today is negative   4. BPH Refill given for tamsulosin Patient will be getting insurance next month and would like to follow-up afterwards for his BPH issues  5. ED Patient has expired nitroglycerin tablets and I warned him that he is not to take the sildenafil with the nitroglycerin as it can be fatal He states he will throw the nitroglycerin tablets away Sildenafil 20 mg, 3 to 5 tablets two hours prior to intercourse on an empty stomach, # 50; he is warned not to take medications that contain nitrates.  I also advised him of the side effects, such as: headache, flushing, dyspepsia, abnormal vision, nasal congestion, back pain, myalgia, nausea, dizziness, and rash.    Return for pending RUS and UA results .  These notes  generated with voice recognition software. I apologize for typographical errors.  Zara Council, PA-C  [facility] [address]  [place], [place] [address] [phone]

## 2017-11-29 ENCOUNTER — Telehealth: Payer: Self-pay | Admitting: Family Medicine

## 2017-11-29 DIAGNOSIS — K219 Gastro-esophageal reflux disease without esophagitis: Secondary | ICD-10-CM

## 2017-11-30 NOTE — Telephone Encounter (Signed)
Called and left message informing pt that script has been sent to pharmacy however we are needing for him to schedule appt. He has the option of seeing Raquel Sarna

## 2017-11-30 NOTE — Telephone Encounter (Signed)
He needs follow up appointment

## 2017-12-05 ENCOUNTER — Ambulatory Visit
Admission: RE | Admit: 2017-12-05 | Discharge: 2017-12-05 | Disposition: A | Discharge status: Home / Self Care | Payer: Self-pay | Source: Ambulatory Visit | Attending: Urology | Admitting: Urology

## 2017-12-05 DIAGNOSIS — N201 Calculus of ureter: Secondary | ICD-10-CM | POA: Insufficient documentation

## 2017-12-08 ENCOUNTER — Ambulatory Visit: Payer: Self-pay | Admitting: Family Medicine

## 2017-12-08 ENCOUNTER — Encounter: Payer: Self-pay | Admitting: Family Medicine

## 2017-12-08 VITALS — BP 126/78 | HR 78 | Temp 97.9°F | Resp 16 | Ht 67.0 in | Wt 218.1 lb

## 2017-12-08 DIAGNOSIS — M26622 Arthralgia of left temporomandibular joint: Secondary | ICD-10-CM | POA: Insufficient documentation

## 2017-12-08 DIAGNOSIS — M150 Primary generalized (osteo)arthritis: Secondary | ICD-10-CM

## 2017-12-08 DIAGNOSIS — T466X5A Adverse effect of antihyperlipidemic and antiarteriosclerotic drugs, initial encounter: Secondary | ICD-10-CM

## 2017-12-08 DIAGNOSIS — D72828 Other elevated white blood cell count: Secondary | ICD-10-CM

## 2017-12-08 DIAGNOSIS — M069 Rheumatoid arthritis, unspecified: Secondary | ICD-10-CM

## 2017-12-08 DIAGNOSIS — M791 Myalgia, unspecified site: Secondary | ICD-10-CM

## 2017-12-08 DIAGNOSIS — Z87442 Personal history of urinary calculi: Secondary | ICD-10-CM

## 2017-12-08 DIAGNOSIS — I10 Essential (primary) hypertension: Secondary | ICD-10-CM

## 2017-12-08 DIAGNOSIS — M159 Polyosteoarthritis, unspecified: Secondary | ICD-10-CM

## 2017-12-08 DIAGNOSIS — G43009 Migraine without aura, not intractable, without status migrainosus: Secondary | ICD-10-CM

## 2017-12-08 DIAGNOSIS — E785 Hyperlipidemia, unspecified: Secondary | ICD-10-CM

## 2017-12-08 DIAGNOSIS — D649 Anemia, unspecified: Secondary | ICD-10-CM

## 2017-12-08 DIAGNOSIS — G4733 Obstructive sleep apnea (adult) (pediatric): Secondary | ICD-10-CM

## 2017-12-08 DIAGNOSIS — Z23 Encounter for immunization: Secondary | ICD-10-CM

## 2017-12-08 DIAGNOSIS — J411 Mucopurulent chronic bronchitis: Secondary | ICD-10-CM

## 2017-12-08 DIAGNOSIS — E1169 Type 2 diabetes mellitus with other specified complication: Secondary | ICD-10-CM

## 2017-12-08 DIAGNOSIS — K219 Gastro-esophageal reflux disease without esophagitis: Secondary | ICD-10-CM

## 2017-12-08 MED ORDER — PROMETHAZINE HCL 12.5 MG PO TABS: 12.5000 mg | ORAL_TABLET | Freq: Three times a day (TID) | ORAL | 0 refills | Status: DC | PRN

## 2017-12-08 MED ORDER — KETOROLAC TROMETHAMINE 10 MG PO TABS: 10.0000 mg | ORAL_TABLET | Freq: Four times a day (QID) | ORAL | 0 refills | Status: DC | PRN

## 2017-12-08 MED ORDER — DICLOFENAC SODIUM 1 % TD GEL: TRANSDERMAL | 0 refills | Status: DC

## 2017-12-08 MED ORDER — AMLODIPINE BESY-BENAZEPRIL HCL 5-40 MG PO CAPS: 1.0000 | ORAL_CAPSULE | Freq: Every day | ORAL | 5 refills | Status: DC

## 2017-12-08 MED ORDER — CARVEDILOL 12.5 MG PO TABS: 12.5000 mg | ORAL_TABLET | Freq: Two times a day (BID) | ORAL | 5 refills | Status: DC

## 2017-12-08 MED ORDER — RIZATRIPTAN BENZOATE 10 MG PO TABS: 10.0000 mg | ORAL_TABLET | ORAL | 0 refills | Status: DC | PRN

## 2017-12-08 NOTE — Progress Notes (Signed)
Name: Tim Anderson   MRN: [id number]    DOB: [birth year]   Date:12/08/2017       Progress Note  Subjective  Chief Complaint  Chief Complaint  Patient presents with  . Medication Refill  . Diabetes    Does not check his sugar at home- Lost 6 pounds since last visit  . Gastroesophageal Reflux    Conrolled with medication  . Hyperlipidemia  . Sleep Apnea  . Migraine    Time of the year it is worst- headache everyday  . COPD    Coughing, wheezing and SOB    HPI  Angina he states had a stress test in October 2018  prior to left hip surgery, no chest pain since, denies decrease in exercise tolerance, still has NTG at home. Unchnaged   GERD: he is on medication and symptoms are controlled he has been taking medication daily, states when skipping symptoms returns. He needs refill of Omeprazole  OSA: he has not been using CPAP machine anymore. He states he stopped about 7 years ago because he was feeling claustrophobic. Discussed increase in risk of MI and strokes. He cannot afford repeat study at this time, no insurance   OA: he has OA of multiple joints, he had a righttotal knee replacement in December 2016 and left hip replacement 12/2016, noticing recurrence of left leg pain, but cannot afford going back to ortho at this time. Not on medication for pain or inflammation. He states has recurrent effusion of right knee at the end of the day  Migraine/headaches: he has dull ache/tension type headachedull frontal headache at least once a week, he takes Toradol prn for that. He has migraine seldom, nuchal and associated with nausea and vomiting, he states last episodes have been more frequent lately, 3 episodes this past week, but usually not even once a month. He states triggered by weather changes. He has phonophobia and photophobia with episodes of migraine, sometimes vomiting.   COPD: no longer using Sipiriva, using Combiventprn only, has daily cough, that is productive,  yellow in color, sometimes wakes up at night with coughing spells. Smoking again, discussed importance of quitting   RA: sees Rheumatologist, off Enbrel on Plaquenil given by Dr. Jefm Bryant. Only swelling is on right knee intermittently. Pain is under control now   HTN:bp is at goal, no recent episodes of chest pain or palpitation. Needs refills.   DMII: diagnosed Nov 2016, he is on diet only, gained a lot of weight since he quit smoking 11/2016, but now smoking again. He denies polyphagia, polydipsia,  polyuria.. We will recheck hgbA1C today. He has ED and takes Revation prn - that causes diarrhea the following day - stable and on lower dose now.  Also has Dyslipidemia with very low HDL, discussed ways to bring it up. He states unable to tolerate statin therapy   Hyperlipidemia: he stopped Lipitor and Crestor because of muscle aches. He has low HDL    BPH: seeing [facility], recently passed a kidney stone, wbc was very high and we will recheck levels. Repeat US did not show stone  Patient Active Problem List   Diagnosis Date Noted  . Arthralgia of left temporomandibular joint 12/08/2017  . Mucopurulent chronic bronchitis ([place]) 12/08/2017  . Mild left ventricular systolic dysfunction 84/13/2440  . Grade II diastolic dysfunction 12/12/2534  . Mild mitral regurgitation 07/05/2017  . Mild tricuspid regurgitation 07/05/2017  . 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 History of left hip replacement 12/27/2016  .  Angina pectoris ([place]) 10/27/2016  . Carpal tunnel syndrome on both sides 04/07/2016  . Primary osteoarthritis involving multiple joints 07/29/2015  . Arthritis of knee, degenerative 02/03/2015  . Dyslipidemia associated with type 2 diabetes mellitus ([place]) 01/27/2015  . Benign fibroma of prostate 09/28/2014  . Bronchitis with chronic airway obstruction ([place]) 09/28/2014  . Drug abuse ([place]) 09/28/2014  . Deflected nasal septum 09/28/2014  . Dyslipidemia  09/28/2014  . Dysmetabolic syndrome 67/34/1937  . GERD (gastroesophageal reflux disease) 09/28/2014  . H/O renal calculi 09/28/2014  . Benign hypertension 09/28/2014  . Male hypogonadism 09/28/2014  . Failure of erection 09/28/2014  . Obstructive apnea 09/28/2014  . Depression with anxiety 09/28/2014  . Obesity (BMI 30-39.9) 09/28/2014  . Migraine without aura and responsive to treatment 09/28/2014  . Perennial allergic rhinitis with seasonal variation 09/28/2014  . Elevated platelet count 09/28/2014  . Tobacco abuse 09/28/2014  . Cerebrovascular accident (CVA) ([place]) 07/27/2013  . History of TIA (transient ischemic attack) 09/20/2011  . Rheumatoid arthritis involving multiple joints ([place]) 07/31/2009    Past Surgical History:  Procedure Laterality Date  . FACIAL RECONSTRUCTION SURGERY    . KNEE SURGERY Right     Family History  Problem Relation Age of Onset  . Diabetes Mother   . Diabetes Father   . Heart attack Father   . Cancer Brother        Skin and Prostate-Oldest Brother  . Diabetes Brother     Social History   Socioeconomic History  . Marital status: Married    Spouse name: Lattie Haw   . Number of children: 3  . Years of education: Not on file  . Highest education level: 12th grade  Occupational History  . Occupation: Architect     Comment: self employed   Social Needs  . Financial resource strain: Not on file  . Food insecurity:    Worry: Not on file    Inability: Not on file  . Transportation needs:    Medical: Not on file    Non-medical: Not on file  Tobacco Use  . Smoking status: Current Every Day Smoker    Packs/day: 1.00    Years: 39.00    Pack years: 39.00    Types: Cigarettes    Start date: 09/30/1976  . Smokeless tobacco: Never Used  Substance and Sexual Activity  . Alcohol use: Yes    Alcohol/week: 0.0 standard drinks    Comment: occasionally  . Drug use: Not Currently    Types: Marijuana    Comment: used to use cocaine  . Sexual  activity: Yes    Partners: Female  Lifestyle  . Physical activity:    Days per week: 7 days    Minutes per session: 30 min  . Stress: Not at all  Relationships  . Social connections:    Talks on phone: More than three times a week    Gets together: More than three times a week    Attends religious service: 1 to 4 times per year    Active member of club or organization: No    Attends meetings of clubs or organizations: Never    Relationship status: Married  . Intimate partner violence:    Fear of current or ex partner: No    Emotionally abused: No    Physically abused: No    Forced sexual activity: No  Other Topics Concern  . Not on file  Social History Narrative   On his 4th marriage  Current Outpatient Medications:  .  amLODipine-benazepril (LOTREL) 5-40 MG capsule, Take 1 capsule by mouth daily., Disp: 30 capsule, Rfl: 5 .  aspirin EC 81 MG tablet, Take 81 mg by mouth. Reported on 07/29/2015, Disp: , Rfl:  .  carvedilol (COREG) 12.5 MG tablet, Take 1 tablet (12.5 mg total) by mouth 2 (two) times daily with a meal., Disp: 30 tablet, Rfl: 5 .  Fluticasone-Umeclidin-Vilant (TRELEGY ELLIPTA) 100-62.5-25 MCG/INH AEPB, Take 1 puff by mouth daily., Disp: 60 each, Rfl: 5 .  hydroxychloroquine (PLAQUENIL) 200 MG tablet, Take 1 tablet by mouth daily., Disp: , Rfl:  .  ketorolac (TORADOL) 10 MG tablet, Take 1 tablet (10 mg total) by mouth every 6 (six) hours as needed., Disp: 30 tablet, Rfl: 0 .  omeprazole (PRILOSEC) 20 MG capsule, Take 1 capsule (20 mg total) by mouth daily., Disp: 30 capsule, Rfl: 0 .  promethazine (PHENERGAN) 12.5 MG tablet, Take 1 tablet (12.5 mg total) by mouth every 8 (eight) hours as needed for nausea or vomiting., Disp: 20 tablet, Rfl: 0 .  rizatriptan (MAXALT) 10 MG tablet, Take 1 tablet (10 mg total) by mouth as needed for migraine., Disp: 10 tablet, Rfl: 0 .  sildenafil (REVATIO) 20 MG tablet, Take 3 to 5 tablets two hours before intercouse on an empty  stomach.  Do not take with nitrates., Disp: 50 tablet, Rfl: 3 .  tamsulosin (FLOMAX) 0.4 MG CAPS capsule, Take 1 capsule (0.4 mg total) by mouth daily., Disp: 30 capsule, Rfl: 11 .  diclofenac sodium (VOLTAREN) 1 % GEL, APPLY 4 GRAMS TOPICALLY 4 TIMES A DAY., Disp: 300 g, Rfl: 0  Allergies  Allergen Reactions  . Ciprofloxacin Other (See Comments)    Body aches  . Crestor [Rosuvastatin Calcium] Other (See Comments)    Muscles aches  . Droperidol Other (See Comments)    Heart stopped  . Lipofen  [Fenofibrate]     muscle aches muscle aches  . Niacin Er     flushed    I personally reviewed active problem list, medication list, allergies, family history, social history with the patient/caregiver today.   ROS  Constitutional: Negative for fever , positive for mild  weight change.  Respiratory: Positive  for chronic cough but no shortness of breath.   Cardiovascular: Negative for chest pain or palpitations.  Gastrointestinal: Negative for abdominal pain, no bowel changes.  Musculoskeletal: positive  for intermittent gait problem and left joint swelling.  Skin: Negative for rash.  Neurological: Negative for dizziness, positive for intermittent  headache.  No other specific complaints in a complete review of systems (except as listed in HPI above).  Objective  Vitals:   12/08/17 1112  BP: 126/78  Pulse: 78  Resp: 16  Temp: 97.9 F (36.6 C)  TempSrc: Oral  SpO2: 98%  Weight: 218 lb 1.6 oz (98.9 kg)  Height: '5\' 7"'$  (1.702 m)    Body mass index is 34.16 kg/m.  Physical Exam  Constitutional: Patient appears well-developed and well-nourished. Obese No distress.  HEENT: head atraumatic, normocephalic, pupils equal and reactive to light, neck supple, throat within normal limits. Positive for pain over left TMJ  Cardiovascular: Normal rate, regular rhythm and normal heart sounds.  No murmur heard. No BLE edema. Pulmonary/Chest: Effort normal and breath sounds normal. No  respiratory distress. Abdominal: Soft.  There is no tenderness. Psychiatric: Patient has a normal mood and affect. behavior is normal. Judgment and thought content normal.  Recent Results (from the past 2160 hour(s))  POCT  urinalysis dipstick     Status: Abnormal   Collection Time: 11/14/17  3:21 PM  Result Value Ref Range   Color, UA yellow    Clarity, UA clear    Glucose, UA Negative Negative   Bilirubin, UA negative    Ketones, UA negative    Spec Grav, UA <=1.005 (A) 1.010 - 1.025   Blood, UA large    pH, UA 6.0 5.0 - 8.0   Protein, UA Negative Negative   Urobilinogen, UA negative (A) 0.2 or 1.0 E.U./dL   Nitrite, UA negative    Leukocytes, UA Trace (A) Negative   Appearance clear    Odor    Urine Culture     Status: None   Collection Time: 11/14/17  3:51 PM  Result Value Ref Range   MICRO NUMBER: [id number]    SPECIMEN QUALITY: ADEQUATE    Sample Source URINE    STATUS: FINAL    Result: No Growth   Urinalysis, Complete w Microscopic     Status: Abnormal   Collection Time: 11/14/17  7:20 PM  Result Value Ref Range   Color, Urine YELLOW (A) YELLOW   APPearance CLEAR (A) CLEAR   Specific Gravity, Urine 1.010 1.005 - 1.030   pH 5.0 5.0 - 8.0   Glucose, UA NEGATIVE NEGATIVE mg/dL   Hgb urine dipstick NEGATIVE NEGATIVE   Bilirubin Urine NEGATIVE NEGATIVE   Ketones, ur NEGATIVE NEGATIVE mg/dL   Protein, ur NEGATIVE NEGATIVE mg/dL   Nitrite NEGATIVE NEGATIVE   Leukocytes, UA TRACE (A) NEGATIVE   RBC / HPF 0-5 0 - 5 RBC/hpf   WBC, UA 11-20 0 - 5 WBC/hpf   Bacteria, UA NONE SEEN NONE SEEN   Squamous Epithelial / LPF 0-5 0 - 5   Mucus PRESENT     Comment: Performed at [facility], [address] [place], [place] [address]  Comprehensive metabolic panel     Status: Abnormal   Collection Time: 11/14/17  7:20 PM  Result Value Ref Range   Sodium 135 135 - 145 mmol/L   Potassium 3.9 3.5 - 5.1 mmol/L   Chloride 104 98 - 111 mmol/L   CO2 24 22 - 32 mmol/L    Glucose, Bld 149 (H) 70 - 99 mg/dL   BUN 13 6 - 20 mg/dL   Creatinine, Ser 1.20 0.61 - 1.24 mg/dL   Calcium 8.8 (L) 8.9 - 10.3 mg/dL   Total Protein 6.9 6.5 - 8.1 g/dL   Albumin 3.9 3.5 - 5.0 g/dL   AST 20 15 - 41 U/L   ALT 22 0 - 44 U/L   Alkaline Phosphatase 60 38 - 126 U/L   Total Bilirubin 0.8 0.3 - 1.2 mg/dL   GFR calc non Af Amer >60 >60 mL/min   GFR calc Af Amer >60 >60 mL/min    Comment: (NOTE) The eGFR has been calculated using the CKD EPI equation. This calculation has not been validated in all clinical situations. eGFR's persistently <60 mL/min signify possible Chronic Kidney Disease.    Anion gap 7 5 - 15    Comment: Performed at [facility], [address] [place], [place] [address]  CBC with Differential     Status: Abnormal   Collection Time: 11/14/17  7:20 PM  Result Value Ref Range   WBC 17.9 (H) 3.8 - 10.6 K/uL   RBC 5.03 4.40 - 5.90 MIL/uL   Hemoglobin 12.3 (L) 13.0 - 18.0 g/dL   HCT 36.4 (L) 40.0 - 52.0 %  MCV 72.4 (L) 80.0 - 100.0 fL   MCH 24.5 (L) 26.0 - 34.0 pg   MCHC 33.9 32.0 - 36.0 g/dL   RDW 16.5 (H) 11.5 - 14.5 %   Platelets 391 150 - 440 K/uL   Neutrophils Relative % 71 %   Neutro Abs 12.7 (H) 1.4 - 6.5 K/uL   Lymphocytes Relative 19 %   Lymphs Abs 3.5 1.0 - 3.6 K/uL   Monocytes Relative 8 %   Monocytes Absolute 1.5 (H) 0.2 - 1.0 K/uL   Eosinophils Relative 1 %   Eosinophils Absolute 0.1 0 - 0.7 K/uL   Basophils Relative 1 %   Basophils Absolute 0.2 (H) 0 - 0.1 K/uL    Comment: Performed at [facility], [address] [place], [place] [address]  Urinalysis, Complete     Status: None   Collection Time: 11/23/17  4:12 PM  Result Value Ref Range   Specific Gravity, UA 1.015 1.005 - 1.030   pH, UA 6.5 5.0 - 7.5   Color, UA Yellow Yellow   Appearance Ur Clear Clear   Leukocytes, UA Negative Negative   Protein, UA Negative Negative/Trace   Glucose, UA Negative Negative   Ketones, UA Negative Negative   RBC, UA  Negative Negative   Bilirubin, UA Negative Negative   Urobilinogen, Ur 0.2 0.2 - 1.0 mg/dL   Nitrite, UA Negative Negative    PHQ2/9: Depression screen [facility] 2/9 12/08/2017 11/14/2017 10/27/2016 04/07/2016 10/29/2015  Decreased Interest 0 0 0 0 0  Down, Depressed, Hopeless 0 0 0 0 0  PHQ - 2 Score 0 0 0 0 0  Altered sleeping 0 0 - - -  Tired, decreased energy 0 0 - - -  Change in appetite 0 0 - - -  Feeling bad or failure about yourself  0 0 - - -  Trouble concentrating 0 0 - - -  Moving slowly or fidgety/restless 0 0 - - -  Suicidal thoughts 0 0 - - -  PHQ-9 Score 0 0 - - -  Difficult doing work/chores Not difficult at all Not difficult at all - - -     Fall Risk: Fall Risk  12/08/2017 11/14/2017 05/10/2017 10/27/2016 04/07/2016  Falls in the past year? _0   Number falls in past yr: - - - - -  Injury with Fall? - - - - -     Functional Status Survey: Is the patient deaf or have difficulty hearing?: No Does the patient have difficulty seeing, even when wearing glasses/contacts?: No Does the patient have difficulty concentrating, remembering, or making decisions?: No Does the patient have difficulty walking or climbing stairs?: No Does the patient have difficulty dressing or bathing?: No Does the patient have difficulty doing errands alone such as visiting a doctor's office or shopping?: No   Assessment & Plan  1. Dyslipidemia associated with type 2 diabetes mellitus (HCC)  - Lipid panel - Hemoglobin A1c  2. Need for immunization against influenza  - Flu Vaccine QUAD 6+ mos PF IM (Fluarix Quad PF)  3. Other elevated white blood cell (WBC) count  - CBC with Differential/Platelet  4. H/O renal calculi  Passed stone   5. Gastroesophageal reflux disease without esophagitis   6. Mucopurulent chronic bronchitis ([place])  Needs to quit smoking , not currently using Trelegy but states breathing well now  7. Rheumatoid arthritis involving multiple joints  (HCC)  Needs to follow up with Dr. Jefm Bryant   8. Migraine  without aura and responsive to treatment  - promethazine (PHENERGAN) 12.5 MG tablet; Take 1 tablet (12.5 mg total) by mouth every 8 (eight) hours as needed for nausea or vomiting.  Dispense: 20 tablet; Refill: 0 - ketorolac (TORADOL) 10 MG tablet; Take 1 tablet (10 mg total) by mouth every 6 (six) hours as needed.  Dispense: 30 tablet; Refill: 0 - rizatriptan (MAXALT) 10 MG tablet; Take 1 tablet (10 mg total) by mouth as needed for migraine.  Dispense: 10 tablet; Refill: 0  9. Anemia, unspecified type  Recheck CBC  10. Arthralgia of left temporomandibular joint   11. Obstructive apnea  Not compliant with CPAP   12. Essential hypertension  - amLODipine-benazepril (LOTREL) 5-40 MG capsule; Take 1 capsule by mouth daily.  Dispense: 30 capsule; Refill: 5 - carvedilol (COREG) 12.5 MG tablet; Take 1 tablet (12.5 mg total) by mouth 2 (two) times daily with a meal.  Dispense: 30 tablet; Refill: 5  13. Primary osteoarthritis involving multiple joints  - diclofenac sodium (VOLTAREN) 1 % GEL; APPLY 4 GRAMS TOPICALLY 4 TIMES A DAY.  Dispense: 300 g; Refill: 0  14. Myalgia due to statin

## 2017-12-12 ENCOUNTER — Telehealth: Payer: Self-pay

## 2017-12-12 NOTE — Telephone Encounter (Signed)
-----   Message from Nori Riis, PA-C sent at 12/12/2017  8:08 AM EDT ----- Please let Tim Anderson know that his renal ultrasound was normal.  We will see him in one month after he receives his insurance for a recheck on his UA, ED and BPH with [facility].

## 2017-12-12 NOTE — Telephone Encounter (Signed)
Called pt informed him of results below. Pt gave verbal understanding.  

## 2017-12-28 ENCOUNTER — Other Ambulatory Visit: Payer: Self-pay

## 2017-12-28 DIAGNOSIS — K219 Gastro-esophageal reflux disease without esophagitis: Secondary | ICD-10-CM

## 2017-12-30 MED ORDER — OMEPRAZOLE 20 MG PO CPDR: 20.0000 mg | DELAYED_RELEASE_CAPSULE | Freq: Every day | ORAL | 0 refills | Status: DC

## 2018-01-05 ENCOUNTER — Other Ambulatory Visit: Payer: Self-pay | Admitting: Family Medicine

## 2018-01-05 DIAGNOSIS — G43009 Migraine without aura, not intractable, without status migrainosus: Secondary | ICD-10-CM

## 2018-01-09 ENCOUNTER — Other Ambulatory Visit: Payer: Self-pay

## 2018-01-09 DIAGNOSIS — G43009 Migraine without aura, not intractable, without status migrainosus: Secondary | ICD-10-CM

## 2018-01-09 NOTE — Telephone Encounter (Signed)
Refill request for general medication. Ketorolac to [facility].   Last office visit 12/08/2017   Follow up on 06/09/2018

## 2018-01-26 ENCOUNTER — Other Ambulatory Visit: Payer: Self-pay | Admitting: Family Medicine

## 2018-01-26 DIAGNOSIS — K219 Gastro-esophageal reflux disease without esophagitis: Secondary | ICD-10-CM

## 2018-01-27 NOTE — Telephone Encounter (Signed)
Refill request for general medication. Omeprazole to [facility].   Last office visit 12/08/2017   Follow up on 06/09/18

## 2018-04-23 ENCOUNTER — Other Ambulatory Visit: Payer: Self-pay | Admitting: Family Medicine

## 2018-04-23 DIAGNOSIS — K219 Gastro-esophageal reflux disease without esophagitis: Secondary | ICD-10-CM

## 2018-04-25 ENCOUNTER — Telehealth: Payer: Self-pay | Admitting: Urology

## 2018-04-25 NOTE — Telephone Encounter (Signed)
Would you call Mr. Anglin and have him schedule an appointment to make sure he has no further microscopic blood in his urine?

## 2018-04-27 NOTE — Telephone Encounter (Signed)
Called patient had to leave a message  Patient needs to schedule a follow up app with Margretta Ditty

## 2018-05-10 ENCOUNTER — Other Ambulatory Visit: Payer: Self-pay | Admitting: Family Medicine

## 2018-05-10 DIAGNOSIS — I10 Essential (primary) hypertension: Secondary | ICD-10-CM

## 2018-05-22 ENCOUNTER — Encounter: Payer: Self-pay | Admitting: Urology

## 2018-05-22 NOTE — Progress Notes (Signed)
Certified letter sent 05/22/2018

## 2018-05-29 ENCOUNTER — Other Ambulatory Visit: Payer: Self-pay | Admitting: Family Medicine

## 2018-05-29 DIAGNOSIS — K219 Gastro-esophageal reflux disease without esophagitis: Secondary | ICD-10-CM

## 2018-05-29 NOTE — Telephone Encounter (Signed)
Refill request for general medication: Omeprazole 20 mg  Last office visit: 12/08/2017   Follow up: 06/09/2018

## 2018-06-01 ENCOUNTER — Other Ambulatory Visit: Payer: Self-pay | Admitting: Urology

## 2018-06-01 DIAGNOSIS — R918 Other nonspecific abnormal finding of lung field: Secondary | ICD-10-CM

## 2018-06-01 NOTE — Progress Notes (Signed)
I have reached Mr. Tim Anderson and scheduled a follow up chest CT for a lung nodule.  I advised him that this may be a lung cancer and that repeat imaging is necessary to monitor the nodule.   I would like the CT scan done in the next two weeks.  Orders are in his chart.

## 2018-06-05 ENCOUNTER — Telehealth: Payer: Self-pay | Admitting: Urology

## 2018-06-05 NOTE — Telephone Encounter (Signed)
Yes

## 2018-06-05 NOTE — Telephone Encounter (Signed)
He is scheduled for his CT scan on 06-08-18 are you going to call him with results?   Sharyn Lull

## 2018-06-08 ENCOUNTER — Other Ambulatory Visit: Payer: Self-pay

## 2018-06-08 ENCOUNTER — Ambulatory Visit
Admission: RE | Admit: 2018-06-08 | Discharge: 2018-06-08 | Disposition: A | Discharge status: Home / Self Care | Payer: Self-pay | Source: Ambulatory Visit | Attending: Urology | Admitting: Urology

## 2018-06-08 DIAGNOSIS — R918 Other nonspecific abnormal finding of lung field: Secondary | ICD-10-CM | POA: Insufficient documentation

## 2018-06-09 ENCOUNTER — Ambulatory Visit: Payer: Self-pay | Admitting: Pharmacist

## 2018-06-09 ENCOUNTER — Ambulatory Visit (INDEPENDENT_AMBULATORY_CARE_PROVIDER_SITE_OTHER): Payer: Self-pay | Admitting: Family Medicine

## 2018-06-09 ENCOUNTER — Encounter: Payer: Self-pay | Admitting: Family Medicine

## 2018-06-09 VITALS — Wt 219.0 lb

## 2018-06-09 DIAGNOSIS — E119 Type 2 diabetes mellitus without complications: Secondary | ICD-10-CM

## 2018-06-09 DIAGNOSIS — J411 Mucopurulent chronic bronchitis: Secondary | ICD-10-CM

## 2018-06-09 DIAGNOSIS — F17200 Nicotine dependence, unspecified, uncomplicated: Secondary | ICD-10-CM

## 2018-06-09 DIAGNOSIS — K219 Gastro-esophageal reflux disease without esophagitis: Secondary | ICD-10-CM

## 2018-06-09 DIAGNOSIS — E1169 Type 2 diabetes mellitus with other specified complication: Secondary | ICD-10-CM

## 2018-06-09 DIAGNOSIS — E785 Hyperlipidemia, unspecified: Principal | ICD-10-CM

## 2018-06-09 DIAGNOSIS — I10 Essential (primary) hypertension: Secondary | ICD-10-CM

## 2018-06-09 DIAGNOSIS — I209 Angina pectoris, unspecified: Secondary | ICD-10-CM

## 2018-06-09 DIAGNOSIS — G4733 Obstructive sleep apnea (adult) (pediatric): Secondary | ICD-10-CM

## 2018-06-09 DIAGNOSIS — G43009 Migraine without aura, not intractable, without status migrainosus: Secondary | ICD-10-CM

## 2018-06-09 DIAGNOSIS — I700 Atherosclerosis of aorta: Secondary | ICD-10-CM

## 2018-06-09 DIAGNOSIS — M069 Rheumatoid arthritis, unspecified: Secondary | ICD-10-CM

## 2018-06-09 DIAGNOSIS — R918 Other nonspecific abnormal finding of lung field: Secondary | ICD-10-CM | POA: Insufficient documentation

## 2018-06-09 DIAGNOSIS — R590 Localized enlarged lymph nodes: Secondary | ICD-10-CM

## 2018-06-09 MED ORDER — PROMETHAZINE HCL 12.5 MG PO TABS: 12.5000 mg | ORAL_TABLET | Freq: Three times a day (TID) | ORAL | 0 refills | Status: DC | PRN

## 2018-06-09 MED ORDER — KETOROLAC TROMETHAMINE 10 MG PO TABS: 10.0000 mg | ORAL_TABLET | Freq: Four times a day (QID) | ORAL | 0 refills | Status: DC | PRN

## 2018-06-09 MED ORDER — RIZATRIPTAN BENZOATE 10 MG PO TABS: 10.0000 mg | ORAL_TABLET | ORAL | 0 refills | Status: DC | PRN

## 2018-06-09 MED ORDER — CARVEDILOL 12.5 MG PO TABS: 12.5000 mg | ORAL_TABLET | Freq: Two times a day (BID) | ORAL | 3 refills | Status: DC

## 2018-06-09 MED ORDER — AMLODIPINE BESY-BENAZEPRIL HCL 5-20 MG PO CAPS: 1.0000 | ORAL_CAPSULE | Freq: Every day | ORAL | 3 refills | Status: DC

## 2018-06-09 MED ORDER — PRAVASTATIN SODIUM 20 MG PO TABS: 20.0000 mg | ORAL_TABLET | Freq: Every day | ORAL | 0 refills | Status: DC

## 2018-06-09 MED ORDER — OMEPRAZOLE 20 MG PO CPDR: 20.0000 mg | DELAYED_RELEASE_CAPSULE | Freq: Every day | ORAL | 3 refills | Status: DC

## 2018-06-09 NOTE — Progress Notes (Signed)
Name: Tim Anderson   MRN: [id number]    DOB: December 21, 1965   Date:06/09/2018       Progress Note  Subjective  Chief Complaint  Chief Complaint  Patient presents with  . Hypertension  . Migraine  . Gastroesophageal Reflux  . Dyslipidemia  . Diabetes    I connected with  Tim Anderson  on 06/09/18 at  8:00 AM EDT by a video enabled telemedicine application and verified that I am speaking with the correct person using two identifiers.  I discussed the limitations of evaluation and management by telemedicine and the availability of in person appointments. The patient expressed understanding and agreed to proceed. Staff also discussed with the patient that there may be a patient responsible charge related to this service. Patient Location: at home  Provider Location: [facility]   HPI  Abnormal CT chest: order as a repeat for incidental findings on CT abdomen for renal evaluation. Discussed results with patient and recommend evaluation by pulmonologist   IMPRESSION: 1. Solid 5 mm right lower lobe pulmonary nodule is stable since 11/14/2017 chest CT, probably benign. 2. Right middle lobe 6 mm solid pulmonary nodule, not previously imaged. 3. Nonspecific mild mediastinal and right hilar lymphadenopathy. 4. Nonspecific patchy subpleural ground-glass attenuation and reticulation in the lungs without bronchiectasis or honeycombing. Imaging appearance favors nonspecific smoking related fibrosis, however a developing interstitial lung disease including UIP cannot be excluded. 5. Suggest attention to all of these findings on a follow-up dedicated high-resolution chest CT study in 6 months. 6. Three-vessel coronary atherosclerosis.   Angina he states had a stress test in October 2018  prior to left hip surgery, no chest pain since, denies decrease in exercise tolerance. Three vessel coronary atherosclerosis on CT chest .   GERD: he is on medication and  symptoms are controlledhe has been taking medication daily, states when skipping symptoms returns. Unchanged   OSA: he has not been using CPAP machine anymore. He states he stopped about7years ago because he was feeling claustrophobic. Discussed increase in risk of MI and strokes. He cannot afford repeat study at this time, no insurance . Unchanged   Migraine/headaches: he has dull ache/tension type headachedull frontal headacheat least once a week, he takes Toradol prn for that. He has migraine seldom, nuchal and associated with nausea and vomiting. Needs refills of medications  COPD: no longer using Sipiriva, using Combiventprn only, has daily cough, that is productive, yellow in color, sometimes wakes up at night with coughing spells, reviewed CT with patient . Smoking again, discussed importance of quitting . He cannot inhalers and we will refer him to chronic care management   RA: sees Rheumatologist, off Enbrelon Plaquenil given by Dr. Jefm Bryant. He had a steroid injection on his left knee yesterday All joints aching, he was out of plaquenil for one week but is back on it since yesterday. Pharmacy shortage   HTN:bp is at goal,no recent episodes of chest pain or palpitation.BP at Dr. Jefm Bryant yesterday was 112/82, no dizziness.   DMII: diagnosed Nov 2016, he is on diet only, gained a lot of weight since he quit smoking 11/2016, he resumed smoking last year but weight is still up. He has noticed  polyphagia, polydipsia, polyuria.. We will recheck hgbA1C today. He has ED and takes Revatio prn - that causes diarrhea the following day - stable and on lower dose now.  Also has Dyslipidemia with very low HDL, we will try pravastatin again   Hyperlipidemia: he stopped  Lipitor and Crestor because of muscle aches. He has low HDL , discussed CT results, willing to try pravastatin  BPH: seeing [facility], and is up to date with visits   Patient Active Problem List   Diagnosis  Date Noted  . Arthralgia of left temporomandibular joint 12/08/2017  . Mucopurulent chronic bronchitis ([place]) 12/08/2017  . Mild left ventricular systolic dysfunction 12/09/8525  . Grade II diastolic dysfunction 78/24/2353  . Mild mitral regurgitation 07/05/2017  . Mild tricuspid regurgitation 07/05/2017  . 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 History of left hip replacement 12/27/2016  . Angina pectoris ([place]) 10/27/2016  . Carpal tunnel syndrome on both sides 04/07/2016  . Primary osteoarthritis involving multiple joints 07/29/2015  . Arthritis of knee, degenerative 02/03/2015  . Dyslipidemia associated with type 2 diabetes mellitus ([place]) 01/27/2015  . Benign fibroma of prostate 09/28/2014  . Bronchitis with chronic airway obstruction ([place]) 09/28/2014  . Drug abuse ([place]) 09/28/2014  . Deflected nasal septum 09/28/2014  . Dyslipidemia 09/28/2014  . Dysmetabolic syndrome 61/44/3154  . GERD (gastroesophageal reflux disease) 09/28/2014  . H/O renal calculi 09/28/2014  . Benign hypertension 09/28/2014  . Male hypogonadism 09/28/2014  . Failure of erection 09/28/2014  . Obstructive apnea 09/28/2014  . Depression with anxiety 09/28/2014  . Obesity (BMI 30-39.9) 09/28/2014  . Migraine without aura and responsive to treatment 09/28/2014  . Perennial allergic rhinitis with seasonal variation 09/28/2014  . Elevated platelet count 09/28/2014  . Tobacco abuse 09/28/2014  . Cerebrovascular accident (CVA) ([place]) 07/27/2013  . History of TIA (transient ischemic attack) 09/20/2011  . Rheumatoid arthritis involving multiple joints ([place]) 07/31/2009    Past Surgical History:  Procedure Laterality Date  . FACIAL RECONSTRUCTION SURGERY    . KNEE SURGERY Right     Family History  Problem Relation Age of Onset  . Diabetes Mother   . Diabetes Father   . Heart attack Father   . Cancer Brother        Skin and Prostate-Oldest Brother  . Diabetes Brother     Social History    Socioeconomic History  . Marital status: Married    Spouse name: Lattie Haw   . Number of children: 3  . Years of education: Not on file  . Highest education level: 12th grade  Occupational History  . Occupation: Architect     Comment: self employed   Social Needs  . Financial resource strain: Not on file  . Food insecurity:    Worry: Not on file    Inability: Not on file  . Transportation needs:    Medical: Not on file    Non-medical: Not on file  Tobacco Use  . Smoking status: Current Every Day Smoker    Packs/day: 1.00    Years: 39.00    Pack years: 39.00    Types: Cigarettes    Start date: 09/30/1976  . Smokeless tobacco: Never Used  Substance and Sexual Activity  . Alcohol use: Yes    Alcohol/week: 0.0 standard drinks    Comment: occasionally  . Drug use: Not Currently    Types: Marijuana    Comment: used to use cocaine  . Sexual activity: Yes    Partners: Female  Lifestyle  . Physical activity:    Days per week: 7 days    Minutes per session: 30 min  . Stress: Not at all  Relationships  . Social connections:    Talks on phone: More than three times a week    Gets together:  More than three times a week    Attends religious service: 1 to 4 times per year    Active member of club or organization: No    Attends meetings of clubs or organizations: Never    Relationship status: Married  . Intimate partner violence:    Fear of current or ex partner: No    Emotionally abused: No    Physically abused: No    Forced sexual activity: No  Other Topics Concern  . Not on file  Social History Narrative   On his 4th marriage     Current Outpatient Medications:  .  amLODipine-benazepril (LOTREL) 5-40 MG capsule, Take 1 capsule by mouth daily., Disp: 30 capsule, Rfl: 0 .  aspirin EC 81 MG tablet, Take 81 mg by mouth. Reported on 07/29/2015, Disp: , Rfl:  .  carvedilol (COREG) 12.5 MG tablet, Take 1 tablet (12.5 mg total) by mouth 2 (two) times daily with a meal., Disp:  30 tablet, Rfl: 5 .  diclofenac sodium (VOLTAREN) 1 % GEL, APPLY 4 GRAMS TOPICALLY 4 TIMES A DAY., Disp: 300 g, Rfl: 0 .  Fluticasone-Umeclidin-Vilant (TRELEGY ELLIPTA) 100-62.5-25 MCG/INH AEPB, Take 1 puff by mouth daily., Disp: 60 each, Rfl: 5 .  hydroxychloroquine (PLAQUENIL) 200 MG tablet, Take 1 tablet by mouth daily., Disp: , Rfl:  .  ketorolac (TORADOL) 10 MG tablet, Take 1 tablet (10 mg total) by mouth every 6 (six) hours as needed., Disp: 30 tablet, Rfl: 0 .  omeprazole (PRILOSEC) 20 MG capsule, Take 1 capsule (20 mg total) by mouth daily., Disp: 30 capsule, Rfl: 0 .  promethazine (PHENERGAN) 12.5 MG tablet, Take 1 tablet (12.5 mg total) by mouth every 8 (eight) hours as needed for nausea or vomiting., Disp: 20 tablet, Rfl: 0 .  rizatriptan (MAXALT) 10 MG tablet, Take 1 tablet (10 mg total) by mouth as needed for migraine., Disp: 10 tablet, Rfl: 0 .  sildenafil (REVATIO) 20 MG tablet, Take 3 to 5 tablets two hours before intercouse on an empty stomach.  Do not take with nitrates., Disp: 50 tablet, Rfl: 3 .  tamsulosin (FLOMAX) 0.4 MG CAPS capsule, Take 1 capsule (0.4 mg total) by mouth daily., Disp: 30 capsule, Rfl: 11  Allergies  Allergen Reactions  . Ciprofloxacin Other (See Comments)    Body aches  . Crestor [Rosuvastatin Calcium] Other (See Comments)    Muscles aches  . Droperidol Other (See Comments)    Heart stopped  . Lipofen  [Fenofibrate]     muscle aches muscle aches  . Niacin Er     flushed    I personally reviewed active problem list, medication list, allergies, family history, social history with the patient/caregiver today.   ROS  Ten systems reviewed and is negative except as mentioned in HPI  Objective  Virtual encounter, vitals not obtained. Reviewed vitals done at Rheumatologist yesterday   Physical Exam  Awake, alert , oriented, no distress  PHQ2/9: Depression screen [facility] 2/9 06/09/2018 12/08/2017 11/14/2017 10/27/2016 04/07/2016  Decreased Interest 0  0 0 0 0  Down, Depressed, Hopeless 0 0 0 0 0  PHQ - 2 Score 0 0 0 0 0  Altered sleeping 0 0 0 - -  Tired, decreased energy 0 0 0 - -  Change in appetite 0 0 0 - -  Feeling bad or failure about yourself  0 0 0 - -  Trouble concentrating 0 0 0 - -  Moving slowly or fidgety/restless 0 0 0 - -  Suicidal thoughts 0 0 0 - -  PHQ-9 Score 0 0 0 - -  Difficult doing work/chores - Not difficult at all Not difficult at all - -   PHQ-2/9 Result is negative.    Fall Risk: Fall Risk  06/09/2018 12/08/2017 11/14/2017 05/10/2017 10/27/2016  Falls in the past year? 1 No No No No  Number falls in past yr: 1 - - - -  Injury with Fall? 1 - - - -     Assessment & Plan  1. Dyslipidemia associated with type 2 diabetes mellitus (HCC)  - Lipid panel - COMPLETE METABOLIC PANEL WITH GFR  2. Atherosclerosis of aorta ([place])  He is willing to resume statin therapy   3. Mediastinal lymphadenopathy  - Ambulatory referral to Pulmonology  4. Lung nodules  - Ambulatory referral to Pulmonology  5. Mucopurulent chronic bronchitis ([place])  - Ambulatory referral to Pulmonology  6. Smoker  - Ambulatory referral to Pulmonology  7. Essential hypertension  We will decrease dose since bp has been towards low end of normal  - amLODipine-benazepril (LOTREL) 5-20 MG capsule; Take 1 capsule by mouth daily.  Dispense: 30 capsule; Refill: 3 - carvedilol (COREG) 12.5 MG tablet; Take 1 tablet (12.5 mg total) by mouth 2 (two) times daily with a meal.  Dispense: 30 tablet; Refill: 3  8. Obstructive apnea  Cannot afford repeat study   9. Migraine without aura and responsive to treatment  - ketorolac (TORADOL) 10 MG tablet; Take 1 tablet (10 mg total) by mouth every 6 (six) hours as needed.  Dispense: 30 tablet; Refill: 0 - promethazine (PHENERGAN) 12.5 MG tablet; Take 1 tablet (12.5 mg total) by mouth every 8 (eight) hours as needed for nausea or vomiting.  Dispense: 20 tablet; Refill: 0 - rizatriptan (MAXALT) 10  MG tablet; Take 1 tablet (10 mg total) by mouth as needed for migraine.  Dispense: 10 tablet; Refill: 0  10. Rheumatoid arthritis involving multiple joints (HCC)  Under the care of Dr, Jefm Bryant   11. Gastroesophageal reflux disease without esophagitis  - omeprazole (PRILOSEC) 20 MG capsule; Take 1 capsule (20 mg total) by mouth daily.  Dispense: 30 capsule; Refill: 3  12. Angina pectoris (HCC)  Stable   13. Diabetes mellitus type 2, diet-controlled ([place])  - Hemoglobin A1c  I discussed the assessment and treatment plan with the patient. The patient was provided an opportunity to ask questions and all were answered. The patient agreed with the plan and demonstrated an understanding of the instructions.  The patient was advised to call back or seek an in-person evaluation if the symptoms worsen or if the condition fails to improve as anticipated.  I provided 25 minutes of non-face-to-face time during this encounter.

## 2018-06-09 NOTE — Patient Instructions (Signed)
Tim Anderson was given information about Care Management services today including:  1. Case Management services includes personalized support from designated clinical staff supervised by his physician, including individualized plan of care and coordination with other care providers 2. 24/7 contact phone numbers for assistance for urgent and routine care needs. 3. The patient may stop case management services at any time by phone call to the office staff.  Patient agreed to services and verbal consent obtained.

## 2018-06-09 NOTE — Chronic Care Management (AMB) (Signed)
  Care Management   Note  06/09/2018 Name: Tim Anderson MRN: [id number] DOB: 1965/08/23  Tim Anderson is a 53 y.o. year old male who sees Steele Sizer, MD for primary care. Dr. Ancil Boozer asked the CCM team to consult the patient for medication assistance. Referral was placed 06/09/18. Telephone outreach to patient today to introduce care management services.   Plan: [name] that care management services would be helpful to him/her and verbal consent was obtained. I have scheduled an initial telephone visit for Tuesday, April 28 at 11 am   [name] was given information about Care Management services today including:  1. Case Management services includes personalized support from designated clinical staff supervised by his physician, including individualized plan of care and coordination with other care providers 2. 24/7 contact phone numbers for assistance for urgent and routine care needs. 3. The patient may stop case management services at any time by phone call to the office staff.  Patient agreed to services and verbal consent obtained.    Ruben Reason, PharmD Clinical Pharmacist [facility] [phone]

## 2018-06-10 LAB — COMPLETE METABOLIC PANEL WITH GFR
AG Ratio: 1.3 (calc) (ref 1.0–2.5)
ALT: 15 U/L (ref 9–46)
AST: 12 U/L (ref 10–35)
Albumin: 3.8 g/dL (ref 3.6–5.1)
Alkaline phosphatase (APISO): 59 U/L (ref 35–144)
BUN/Creatinine Ratio: 15 (calc) (ref 6–22)
BUN: 10 mg/dL (ref 7–25)
CO2: 31 mmol/L (ref 20–32)
Calcium: 9 mg/dL (ref 8.6–10.3)
Chloride: 104 mmol/L (ref 98–110)
Creat: 0.67 mg/dL — ABNORMAL LOW (ref 0.70–1.33)
GFR, Est African American: 125 mL/min/{1.73_m2} (ref >=60)
GFR, Est Non African American: 108 mL/min/{1.73_m2} (ref >=60)
Globulin: 2.9 g/dL (calc) (ref 1.9–3.7)
Glucose, Bld: 131 mg/dL — ABNORMAL HIGH (ref 65–99)
Potassium: 4.4 mmol/L (ref 3.5–5.3)
Sodium: 140 mmol/L (ref 135–146)
Total Bilirubin: 0.3 mg/dL (ref 0.2–1.2)
Total Protein: 6.7 g/dL (ref 6.1–8.1)

## 2018-06-10 LAB — LIPID PANEL
Cholesterol: 166 mg/dL (ref <200)
HDL: 28 mg/dL — ABNORMAL LOW (ref >=40)
LDL Cholesterol (Calc): 111 mg/dL (calc) — ABNORMAL HIGH
Non-HDL Cholesterol (Calc): 138 mg/dL (calc) — ABNORMAL HIGH (ref <130)
Total CHOL/HDL Ratio: 5.9 (calc) — ABNORMAL HIGH (ref <5.0)
Triglycerides: 156 mg/dL — ABNORMAL HIGH (ref <150)

## 2018-06-10 LAB — HEMOGLOBIN A1C
Hgb A1c MFr Bld: 5.9 % of total Hgb — ABNORMAL HIGH (ref <5.7)
Mean Plasma Glucose: 123 (calc)
eAG (mmol/L): 6.8 (calc)

## 2018-06-13 ENCOUNTER — Telehealth (INDEPENDENT_AMBULATORY_CARE_PROVIDER_SITE_OTHER): Payer: Self-pay | Admitting: Urology

## 2018-06-13 ENCOUNTER — Other Ambulatory Visit: Payer: Self-pay

## 2018-06-13 ENCOUNTER — Ambulatory Visit: Payer: Self-pay | Admitting: Pharmacist

## 2018-06-13 DIAGNOSIS — R911 Solitary pulmonary nodule: Secondary | ICD-10-CM

## 2018-06-13 DIAGNOSIS — F17200 Nicotine dependence, unspecified, uncomplicated: Secondary | ICD-10-CM

## 2018-06-13 DIAGNOSIS — E785 Hyperlipidemia, unspecified: Secondary | ICD-10-CM

## 2018-06-13 DIAGNOSIS — E1169 Type 2 diabetes mellitus with other specified complication: Secondary | ICD-10-CM

## 2018-06-13 DIAGNOSIS — J411 Mucopurulent chronic bronchitis: Secondary | ICD-10-CM

## 2018-06-14 NOTE — Progress Notes (Signed)
Virtual Visit via Telephone Note  I connected with Tim Anderson on 06/13/2018 at  by telephone as audio/video was not functioning and verified that I am speaking with the correct person using two identifiers.  They are located at home.  I am located at my home.    This visit type was conducted due to national recommendations for restrictions regarding the COVID-19 Pandemic (e.g. social distancing).  This format is felt to be most appropriate for this patient at this time.  All issues noted in this document were discussed and addressed.  No physical exam was performed.   I discussed the limitations, risks, security and privacy concerns of performing an evaluation and management service by telephone and the availability of in person appointments. I also discussed with the patient that there may be a patient responsible charge related to this service. The patient expressed understanding and agreed to proceed.   History of Present Illness: Tim Anderson is a 53 year old male with a history of nephrolithiasis, BPH, ED and a right lung nodule who is contacted to discuss the results of a repeated chest CT.    A 5 mm nodular density in the right lower lobe of his lung was discovered on a CT Renal stone study in 10/2017.  A chest CT was obtained on 06/08/2018 for surveillance.  Findings: A solid 5 mm right lower lobe pulmonary nodule is stable since 11/14/2017 chest CT, probably benign.  Right middle lobe 6 mm solid pulmonary nodule, not previously imaged.  Nonspecific mild mediastinal and right hilar lymphadenopathy.  Nonspecific patchy subpleural ground-glass attenuation and reticulation in the lungs without bronchiectasis or  Honeycombing.  Imaging appearance favors nonspecific smoking related fibrosis, however a developing interstitial lung disease including UIP cannot be excluded.  Suggest attention to all of these findings on a follow-up dedicated high-resolution chest CT study in 6 months.   Three-vessel coronary atherosclerosis.    He states these findings were shared with him with his visit on 06/09/2018 with Dr. Ancil Boozer.  She has made arrangements for him to follow up with Dr. Mortimer Fries in May.     Observations/Objective: CLINICAL DATA:  Follow-up incidental right pulmonary nodule on prior CT abdomen study. Current smoker.  EXAM: CT CHEST WITHOUT CONTRAST  TECHNIQUE: Multidetector CT imaging of the chest was performed following the standard protocol without IV contrast.  COMPARISON:  11/14/2017 CT abdomen/pelvis.  FINDINGS: Cardiovascular: Normal heart size. No significant pericardial effusion/thickening. Three-vessel coronary atherosclerosis. Atherosclerotic nonaneurysmal thoracic aorta. Normal caliber pulmonary arteries.  Mediastinum/Nodes: No discrete thyroid nodules. Unremarkable esophagus. No axillary adenopathy. Mildly enlarged 1.0 cm right lower paratracheal node (series 2/image 63). Mildly enlarged 1.0 cm left paratracheal node (series 2/image 62). Mildly enlarged 1.1 cm right hilar node (series 2/image 66). No discrete left hilar adenopathy on this noncontrast scan.  Lungs/Pleura: No pneumothorax. No pleural effusion. Mild paraseptal emphysema with diffuse bronchial wall thickening. Subpleural solid average diameter 6 mm posterior right middle lobe pulmonary nodule along the major fissure (series 3/image 78), not previously imaged. Solid 5 mm right lower lobe pulmonary nodule posteriorly (series 3/image 111), stable since 11/14/2017 chest CT. No acute consolidative airspace disease, lung masses or additional significant pulmonary nodules. Nonspecific mild patchy subpleural reticulation and ground-glass attenuation in both lungs without a clear apically basilar gradient and and without significant regions of traction bronchiectasis or frank honeycombing.  Upper abdomen: No acute abnormality.  Musculoskeletal: No aggressive appearing focal osseous  lesions. Mild thoracic spondylosis.  IMPRESSION: 1. Solid 5 mm  right lower lobe pulmonary nodule is stable since 11/14/2017 chest CT, probably benign. 2. Right middle lobe 6 mm solid pulmonary nodule, not previously imaged. 3. Nonspecific mild mediastinal and right hilar lymphadenopathy. 4. Nonspecific patchy subpleural ground-glass attenuation and reticulation in the lungs without bronchiectasis or honeycombing. Imaging appearance favors nonspecific smoking related fibrosis, however a developing interstitial lung disease including UIP cannot be excluded. 5. Suggest attention to all of these findings on a follow-up dedicated high-resolution chest CT study in 6 months. 6. Three-vessel coronary atherosclerosis.  Aortic Atherosclerosis (ICD10-I70.0) and Emphysema (ICD10-J43.9).   Electronically Signed   By: Ilona Sorrel M.D.   On: 06/08/2018 13:50  Assessment and Plan:  1. Lungs nodules -referral in place for pulmonology by PCP  2. BPH - still in need of insurance coverage and will follow up once he secures insurance  3. ED - see BPH  4. History of nephrothiasis - see BPH  Follow Up Instructions:    I discussed the assessment and treatment plan with the patient. The patient was provided an opportunity to ask questions and all were answered. The patient agreed with the plan and demonstrated an understanding of the instructions.   The patient was advised to call back or seek an in-person evaluation if the symptoms worsen or if the condition fails to improve as anticipated.  I provided 7 minutes of non-face-to-face time during this encounter.   Allegra Cerniglia, PA-C

## 2018-06-15 NOTE — Chronic Care Management (AMB) (Signed)
Care Management   Note  06/15/2018 Name: Tim Anderson MRN: [id number] DOB: May 18, 1965  Anderson for referral: Medication assistance   Referral source: Dr. Steele Anderson Referral medication(s): Trellegy Ellipta Current insurance: uninsured  PMHx: HTN, migraine, GERD, HLD, DM, OSA   HPI: COPD and pulmonary nodule (most likely benign); previously using Spiriva, Combivent prn; Current smoker   Objective: Allergies  Allergen Reactions  . Ciprofloxacin Other (See Comments)    Body aches  . Crestor [Rosuvastatin Calcium] Other (See Comments)    Muscles aches  . Droperidol Other (See Comments)    Heart stopped  . Lipofen  [Fenofibrate]     muscle aches muscle aches  . Niacin Er     flushed    Medications Reviewed Today    Reviewed by Tim Anderson (Physician Assistant Certified) on 21/19/41 at 1305  Med List Status: <None>  Medication Order Taking? Sig Documenting Provider Last Dose Status Informant  amLODipine-benazepril (LOTREL) 5-20 MG capsule [id number]  Take 1 capsule by mouth daily. Tim Sizer, MD  Active   aspirin EC 81 MG tablet [id number] No Take 81 mg by mouth. Reported on 07/29/2015 [provider] Taking Active            Med Note Tim Anderson, Tim Anderson   Tue Oct 01, 2014 11:35 AM) Received from: [facility]  carvedilol (COREG) 12.5 MG tablet [id number]  Take 1 tablet (12.5 mg total) by mouth 2 (two) times daily with a meal. Tim Sizer, MD  Active   diclofenac sodium (VOLTAREN) 1 % GEL [id number] No APPLY 4 GRAMS TOPICALLY 4 TIMES A DAY. Tim Sizer, MD Taking Active   Fluticasone-Umeclidin-Vilant (TRELEGY ELLIPTA) 100-62.5-25 MCG/INH AEPB [id number] No Take 1 puff by mouth daily. Tim Sizer, MD Taking Active   hydroxychloroquine (PLAQUENIL) 200 MG tablet [id number] No Take 1 tablet by mouth daily. Tim Kluver., MD Taking Active   ketorolac (TORADOL) 10 MG tablet [id number]  Take 1 tablet (10 mg total) by mouth every 6  (six) hours as needed. Tim Sizer, MD  Active   omeprazole (PRILOSEC) 20 MG capsule [id number]  Take 1 capsule (20 mg total) by mouth daily. Tim Sizer, MD  Active   pravastatin (PRAVACHOL) 20 MG tablet [id number]  Take 1 tablet (20 mg total) by mouth daily. Tim Sizer, MD  Active   promethazine (PHENERGAN) 12.5 MG tablet [id number]  Take 1 tablet (12.5 mg total) by mouth every 8 (eight) hours as needed for nausea or vomiting. Tim Sizer, MD  Active   rizatriptan (MAXALT) 10 MG tablet [id number]  Take 1 tablet (10 mg total) by mouth as needed for migraine. Tim Sizer, MD  Active   sildenafil (REVATIO) 20 MG tablet [id number] No Take 3 to 5 tablets two hours before intercouse on an empty stomach.  Do not take with nitrates. Tim Riis, PA-C Taking Active   tamsulosin (FLOMAX) 0.4 MG CAPS capsule [id number] No Take 1 capsule (0.4 mg total) by mouth daily. Tim Riis, PA-C Taking Active           Assessment:   Medication Review Findings:  . Smoking cessation counseling offered . Discussed pravastatin and benefits of cholesterol lowering medications, especially in combination with smoking   Medication Assistance Findings:    Patient Assistance Programs: 1) Trellegy Ellipta made by [place] o Income requirement met: '[x]'$  Yes '[]'$  No '[]'$  Unknown o Out-of-pocket prescription expenditure met:    '[]'$  Yes '[]'$  No  '[]'$  Unknown  '[x]'$  Not applicable -  Left application at [facility] front desk for patient to pick up and complete within the next 2 weeks     Additional medication assistance options reviewed with patient as warranted:  No other options identified  Goals Addressed            This Visit's Progress   . I can't afford teh Trelegy inhaler (pt-stated)       Current Barriers:  . Financial . Uninsured   Pharmacist Clinical Goal(s): Over the next 14 days, [name] will complete medication assistance application for [place] provided by CCM pharmacist   Interventions: . CCM pharmacist will apply for medication assistance program for Trellegy Ellipta made by [place]   Patient Self Care Activities:  Marland Kitchen Gather necessary documents needed to apply for medication assistance  Initial goal documentation         Plan: Patient is to provide additional documents as necessary.   Follow up:  14 days  Tim Anderson, PharmD Clinical Pharmacist [facility] [phone]

## 2018-06-20 ENCOUNTER — Encounter: Payer: Self-pay | Admitting: Internal Medicine

## 2018-06-20 ENCOUNTER — Other Ambulatory Visit: Payer: Self-pay

## 2018-06-20 ENCOUNTER — Ambulatory Visit (INDEPENDENT_AMBULATORY_CARE_PROVIDER_SITE_OTHER): Payer: Self-pay | Admitting: Internal Medicine

## 2018-06-20 VITALS — BP 142/90 | HR 88 | Ht 68.0 in | Wt 214.8 lb

## 2018-06-20 DIAGNOSIS — R918 Other nonspecific abnormal finding of lung field: Secondary | ICD-10-CM

## 2018-06-20 DIAGNOSIS — R9389 Abnormal findings on diagnostic imaging of other specified body structures: Secondary | ICD-10-CM

## 2018-06-20 DIAGNOSIS — J449 Chronic obstructive pulmonary disease, unspecified: Secondary | ICD-10-CM

## 2018-06-20 DIAGNOSIS — J849 Interstitial pulmonary disease, unspecified: Secondary | ICD-10-CM

## 2018-06-20 DIAGNOSIS — G4733 Obstructive sleep apnea (adult) (pediatric): Secondary | ICD-10-CM

## 2018-06-20 MED ORDER — FLUTICASONE-UMECLIDIN-VILANT 100-62.5-25 MCG/INH IN AEPB: 1.0000 | INHALATION_SPRAY | Freq: Once | RESPIRATORY_TRACT | 5 refills | Status: AC

## 2018-06-20 MED ORDER — FLUTICASONE-UMECLIDIN-VILANT 100-62.5-25 MCG/INH IN AEPB: 1.0000 | INHALATION_SPRAY | Freq: Every day | RESPIRATORY_TRACT | 0 refills | Status: AC

## 2018-06-20 NOTE — Progress Notes (Addendum)
[place] [facility] Consultation      Date: 06/20/2018,   MRN# [id number] Tim Anderson 10-04-1965      Tim Anderson is a 53 y.o. old male seen in consultation for abnormal CT chest at the request of [place]     CHIEF COMPLAINT:   Abnormal CT chest   HISTORY OF PRESENT ILLNESS   53 year old pleasant white male seen today for abnormal CT chest Patient has a small subcentimeter 8 x 4 mm right middle lung nodule shown on CT scan Patient is a heavy smoker 1 pack a day for the last 40 years Patient has a chronic cough with chronic productive mucus production on a daily basis Patient has intermittent shortness of breath Patient has intermittent wheezing  No signs of infection at this time  no signs of respiratory distress at this time  Patient states he was diagnosed with pneumonia 20 years ago Works as a Research officer, 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 and he stays pretty active  Patient has been prescribed trilogy in the past however he only uses it once per month due to the fact that he cannot afford it  Patient also has been diagnosed with sleep apnea and has a CPAP machine but is noncompliant  I have discussed the benefits of smoking cessation   Smoking Assessment and Cessation Counseling   Upon further questioning, Patient smokes 1 pack a day  I have advised patient to quit/stop smoking as soon as possible due to high risk for multiple medical problems  Patient  is NOT willing to quit smoking  I have advised patient that we can assist and have options of Nicotine replacement therapy. I also advised patient on behavioral therapy and can provide oral medication therapy in conjunction with the other therapies  Follow up next Office visit  for assessment of smoking cessation  Smoking cessation counseling advised for 4 minutes      PAST MEDICAL HISTORY   Past Medical History:  Diagnosis Date  . Allergy   . BPH (benign prostatic  hyperplasia)   . COPD (chronic obstructive pulmonary disease) ([place])   . Drug abuse ([place])   . Dysmetabolic syndrome X   . Erosive gastritis   . History of hip replacement 12/2016  . History of knee replacement 01/2015  . History of left hip replacement 12/27/2016  . Hyperlipidemia   . Hypertension   . Hypogonadism male   . Obesity   . Obsessive compulsive disorder   . OSA (obstructive sleep apnea)   . Radiculitis   . Stomatitis monilial   . Thrombocytosis ([place])      SURGICAL HISTORY   Past Surgical History:  Procedure Laterality Date  . FACIAL RECONSTRUCTION SURGERY    . KNEE SURGERY Right      FAMILY HISTORY   Family History  Problem Relation Age of Onset  . Diabetes Mother   . Diabetes Father   . Heart attack Father   . Cancer Brother        Skin and Prostate-Oldest Brother  . Diabetes Brother      SOCIAL HISTORY   Social History   Tobacco Use  . Smoking status: Current Every Day Smoker    Packs/day: 1.00    Years: 39.00    Pack years: 39.00    Types: Cigarettes    Start date: 09/30/1976  . Smokeless tobacco: Never Used  Substance Use Topics  . Alcohol use: Yes    Alcohol/week: 0.0 standard drinks  Comment: occasionally  . Drug use: Not Currently    Types: Marijuana    Comment: used to use cocaine     MEDICATIONS    Home Medication:  Current Outpatient Rx  . Order #: [id number] Class: Normal  . Order #: [id number] Class: Historical Med  . Order #: [id number] Class: Normal  . Order #: [id number] Class: Normal  . Order #: [id number] Class: Normal  . Order #: [id number] Class: Historical Med  . Order #: [id number] Class: Normal  . Order #: [id number] Class: Normal  . Order #: [id number] Class: Normal  . Order #: [id number] Class: Normal  . Order #: [id number] Class: Normal  . Order #: [id number] Class: Normal  . Order #: [id number] Class: Normal    Current Medication:  Current Outpatient Medications:  .  amLODipine-benazepril (LOTREL) 5-20 MG capsule, Take 1  capsule by mouth daily., Disp: 30 capsule, Rfl: 3 .  aspirin EC 81 MG tablet, Take 81 mg by mouth. Reported on 07/29/2015, Disp: , Rfl:  .  carvedilol (COREG) 12.5 MG tablet, Take 1 tablet (12.5 mg total) by mouth 2 (two) times daily with a meal., Disp: 30 tablet, Rfl: 3 .  diclofenac sodium (VOLTAREN) 1 % GEL, APPLY 4 GRAMS TOPICALLY 4 TIMES A DAY., Disp: 300 g, Rfl: 0 .  Fluticasone-Umeclidin-Vilant (TRELEGY ELLIPTA) 100-62.5-25 MCG/INH AEPB, Take 1 puff by mouth daily., Disp: 60 each, Rfl: 5 .  hydroxychloroquine (PLAQUENIL) 200 MG tablet, Take 1 tablet by mouth daily., Disp: , Rfl:  .  ketorolac (TORADOL) 10 MG tablet, Take 1 tablet (10 mg total) by mouth every 6 (six) hours as needed., Disp: 30 tablet, Rfl: 0 .  omeprazole (PRILOSEC) 20 MG capsule, Take 1 capsule (20 mg total) by mouth daily., Disp: 30 capsule, Rfl: 3 .  pravastatin (PRAVACHOL) 20 MG tablet, Take 1 tablet (20 mg total) by mouth daily., Disp: 90 tablet, Rfl: 0 .  promethazine (PHENERGAN) 12.5 MG tablet, Take 1 tablet (12.5 mg total) by mouth every 8 (eight) hours as needed for nausea or vomiting., Disp: 20 tablet, Rfl: 0 .  rizatriptan (MAXALT) 10 MG tablet, Take 1 tablet (10 mg total) by mouth as needed for migraine., Disp: 10 tablet, Rfl: 0 .  sildenafil (REVATIO) 20 MG tablet, Take 3 to 5 tablets two hours before intercouse on an empty stomach.  Do not take with nitrates., Disp: 50 tablet, Rfl: 3 .  tamsulosin (FLOMAX) 0.4 MG CAPS capsule, Take 1 capsule (0.4 mg total) by mouth daily., Disp: 30 capsule, Rfl: 11    ALLERGIES   Ciprofloxacin; Crestor [rosuvastatin calcium]; Droperidol; Lipofen  [fenofibrate]; and Niacin er     REVIEW OF SYSTEMS    Review of Systems:  Gen:  Denies  fever, sweats, chills weigh loss  HEENT: Denies blurred vision, double vision, ear pain, eye pain, hearing loss, nose bleeds, sore throat Cardiac:  No dizziness, chest pain or heaviness, chest tightness,edema Resp:   + Cough + sputum  porduction, + shortness of breath, + wheezing,-hemoptysis,  Gi: Denies swallowing difficulty, stomach pain, nausea or vomiting, diarrhea, constipation, bowel incontinence Gu:  Denies bladder incontinence, burning urine Ext:   Denies Joint pain, stiffness or swelling Skin: Denies  skin rash, easy bruising or bleeding or hives Endoc:  Denies polyuria, polydipsia , polyphagia or weight change Psych:   Denies depression, insomnia or hallucinations   Other:  All other systems negative   BP (!) 142/90 (BP Location: Left Arm, Cuff Size: Normal)   Pulse 88   Ht 5\' 8"  (1.727 m)   Wt 214 lb 12.8  oz (97.4 kg)   SpO2 97%   BMI 32.66 kg/m    PHYSICAL EXAM  Physical Examination:   GENERAL:NAD, no fevers, chills, no weakness no fatigue HEAD: Normocephalic, atraumatic.  EYES: Pupils equal, round, reactive to light. Extraocular muscles intact. No scleral icterus.  MOUTH: Moist mucosal membrane. Dentition intact. No abscess noted.  EAR, NOSE, THROAT: Clear without exudates. No external lesions.  NECK: Supple. No thyromegaly. No nodules. No JVD.  PULMONARY: Diffuse coarse rhonchi right sided +wheezes CARDIOVASCULAR: S1 and S2. Regular rate and rhythm. No murmurs, rubs, or gallops. No edema. Pedal pulses 2+ bilaterally.  GASTROINTESTINAL: Soft, nontender, nondistended. No masses. Positive bowel sounds. No hepatosplenomegaly.  MUSCULOSKELETAL: No swelling, clubbing, or edema. Range of motion full in all extremities.  NEUROLOGIC: Cranial nerves II through XII are intact. No gross focal neurological deficits. Sensation intact. Reflexes intact.  SKIN: No ulceration, lesions, rashes, or cyanosis. Skin warm and dry. Turgor intact.  PSYCHIATRIC: Mood, affect within normal limits. The patient is awake, alert and oriented x 3. Insight, judgment intact.  ALL OTHER ROS ARE NEGATIVE    I have Independently reviewed images of  Ct chest 05/2018   on 06/20/2018 Interpretation:  Subpleural right mid lung nodule  approximately 8 x 4 mm-suggest mucous plug Bilateral bronchiectasis Bilateral interstitial lung disease subpleural microcystic changes consistent with early interstitial lung fibrosis   .kkind  IMAGING    Ct Chest Wo Contrast  Result Date: 06/08/2018 CLINICAL DATA:  Follow-up incidental right pulmonary nodule on prior CT abdomen study. Current smoker. EXAM: CT CHEST WITHOUT CONTRAST TECHNIQUE: Multidetector CT imaging of the chest was performed following the standard protocol without IV contrast. COMPARISON:  11/14/2017 CT abdomen/pelvis. FINDINGS: Cardiovascular: Normal heart size. No significant pericardial effusion/thickening. Three-vessel coronary atherosclerosis. Atherosclerotic nonaneurysmal thoracic aorta. Normal caliber pulmonary arteries. Mediastinum/Nodes: No discrete thyroid nodules. Unremarkable esophagus. No axillary adenopathy. Mildly enlarged 1.0 cm right lower paratracheal node (series 2/image 63). Mildly enlarged 1.0 cm left paratracheal node (series 2/image 62). Mildly enlarged 1.1 cm right hilar node (series 2/image 66). No discrete left hilar adenopathy on this noncontrast scan. Lungs/Pleura: No pneumothorax. No pleural effusion. Mild paraseptal emphysema with diffuse bronchial wall thickening. Subpleural solid average diameter 6 mm posterior right middle lobe pulmonary nodule along the major fissure (series 3/image 78), not previously imaged. Solid 5 mm right lower lobe pulmonary nodule posteriorly (series 3/image 111), stable since 11/14/2017 chest CT. No acute consolidative airspace disease, lung masses or additional significant pulmonary nodules. Nonspecific mild patchy subpleural reticulation and ground-glass attenuation in both lungs without a clear apically basilar gradient and and without significant regions of traction bronchiectasis or frank honeycombing. Upper abdomen: No acute abnormality. Musculoskeletal: No aggressive appearing focal osseous lesions. Mild thoracic  spondylosis. IMPRESSION: 1. Solid 5 mm right lower lobe pulmonary nodule is stable since 11/14/2017 chest CT, probably benign. 2. Right middle lobe 6 mm solid pulmonary nodule, not previously imaged. 3. Nonspecific mild mediastinal and right hilar lymphadenopathy. 4. Nonspecific patchy subpleural ground-glass attenuation and reticulation in the lungs without bronchiectasis or honeycombing. Imaging appearance favors nonspecific smoking related fibrosis, however a developing interstitial lung disease including UIP cannot be excluded. 5. Suggest attention to all of these findings on a follow-up dedicated high-resolution chest CT study in 6 months. 6. Three-vessel coronary atherosclerosis. Aortic Atherosclerosis (ICD10-I70.0) and Emphysema (ICD10-J43.9). Electronically Signed   By: Ilona Sorrel M.D.   On: 06/08/2018 13:50      ASSESSMENT/PLAN   53 year old pleasant white male seen today for abnormal CT  chest At this time I do see several findings which include bilateral interstitial lung disease with microcystic appearance which suggests pulmonary fibrosis in the setting of bronchiectasis and with a strong smoking history patient most likely has underlying chronic obstructive pulmonary disease with chronic bronchitis associated with reactive airways disease with Subcentimeter RT mid lung nodule suggestive of mucus plug. Patient also has underlying sleep apnea but is noncompliant    #1 smoking cessation strongly advised    #2 COPD Patient needs pulmonary function testing to define capacity Will also obtain a 6-minute walk test Recommend continuing triple therapy with Trelegy as prescribed We will provide patient assistance program   #3 right middle lung sub-centimeters pulmonary nodule Repeat CT chest in 6 months for interval changes  #4 interstitial lung disease early pulmonary fibrosis Recommend smoking cessation   #5 OSA Recommend restarting CPAP therapy as previously prescribed     Patient satisfied with Plan of action and management. All questions answered Follow-up in 6 months   Starkisha Tullis Patricia Pesa, M.D.  [facility]  Medical Director [place] Director [facility] Cardio-Pulmonary Department

## 2018-06-20 NOTE — Patient Instructions (Addendum)
PLEASE STOP SMOKING!!!!  Follow up CT chest in 6 months  Continue Trelegy INH as prescribed  Recommend using CPAP as prescribed  PFT's in 6 months and 6 minute Walk Test

## 2018-06-27 ENCOUNTER — Telehealth: Payer: Self-pay

## 2018-06-28 ENCOUNTER — Telehealth: Payer: Self-pay

## 2018-06-29 ENCOUNTER — Ambulatory Visit: Payer: Self-pay | Admitting: Pharmacist

## 2018-06-29 DIAGNOSIS — J411 Mucopurulent chronic bronchitis: Secondary | ICD-10-CM

## 2018-06-29 DIAGNOSIS — F17200 Nicotine dependence, unspecified, uncomplicated: Secondary | ICD-10-CM

## 2018-06-30 NOTE — Chronic Care Management (AMB) (Signed)
  Chronic Care Management   Follow Up Note   06/30/2018 Name: Tim Anderson MRN: [id number] DOB: [birth year]  Referred by: Steele Sizer, MD Reason for referral : Chronic Care Management   Tim Anderson is a 53 y.o. year old male who is a primary care patient of Steele Sizer, MD. The CCM team was consulted for assistance with chronic disease management and care coordination needs.    Review of patient status, including review of consultants reports, relevant laboratory and other test results, and collaboration with appropriate care team members and the patient's provider was performed as part of comprehensive patient evaluation and provision of chronic care management services.    Goals Addressed            This Visit's Progress   . I can't afford the Trelegy inhaler (pt-stated)       Current Barriers:  . Financial . Uninsured   Pharmacist Clinical Goal(s): Over the next 14 days, Tim Anderson will complete medication assistance application for [place] provided by CCM pharmacist  Interventions: . CCM pharmacist will apply for medication assistance program for Trellegy Ellipta made by [place]   Patient Self Care Activities:  Marland Kitchen Gather necessary documents needed to apply for medication assistance  Initial goal documentation        Plan: Patient states he was unable to pick up application left at desk. Application placed in the mail for patient to complete and return.   Follow up: Telephone follow up appointment with CCM team member scheduled for: 2 weeks via telephone with PharmD   Ruben Reason, PharmD Clinical Pharmacist [facility] [phone]

## 2018-07-12 ENCOUNTER — Telehealth: Payer: Self-pay

## 2018-07-13 ENCOUNTER — Other Ambulatory Visit: Payer: Self-pay | Admitting: Family Medicine

## 2018-07-14 ENCOUNTER — Ambulatory Visit: Payer: Self-pay | Admitting: Pharmacist

## 2018-07-14 ENCOUNTER — Other Ambulatory Visit: Payer: Self-pay | Admitting: Family Medicine

## 2018-07-14 DIAGNOSIS — F17200 Nicotine dependence, unspecified, uncomplicated: Secondary | ICD-10-CM

## 2018-07-14 DIAGNOSIS — J411 Mucopurulent chronic bronchitis: Secondary | ICD-10-CM

## 2018-07-14 NOTE — Chronic Care Management (AMB) (Signed)
   Care Management   Note  07/14/2018 Name: Tim Anderson MRN: [id number] DOB: Feb 03, 1966  Reason for referral: Medication Assistance  Referral source: Dr. Ancil Boozer Referral medication(s): Trellegy Current insurance: self=pay   HPI: Chronic bronchitis, smoker; with no insurance, cannot afford Trellegy  Assessment:  Patient Assistance Programs: 1) Trellegy made by [employer] o Income requirement met: '[x]'$  Yes '[]'$  No '[]'$  Unknown o Out-of-pocket prescription expenditure met:    '[]'$  Yes '[]'$  No  '[]'$  Unknown  '[x]'$  Not applicable - Uninsured/self-pay  Goals Addressed            This Visit's Progress   . I can't afford the Trelegy inhaler (pt-stated)       Current Barriers:  . Financial . Uninsured   Pharmacist Clinical Goal(s): Over the next 14 days, Mr.. Kendra will complete medication assistance application for [place] provided by CCM pharmacist  Interventions: . CCM pharmacist will apply for medication assistance program for Trellegy Ellipta made by [place]  Updated 07/14/18: Patient has received [place] application and has partially filled it out. States that he will complete and return it to the office.   Patient Self Care Activities:  Marland Kitchen Gather necessary documents needed to apply for medication assistance . Complete application and return to [facility]/CCM pharmacist  Please see past updates related to this goal by clicking on the "Past Updates" button in the selected goal         Plan: Patient to complete application and return to [facility]  Follow up: Telephone follow up appointment with care management team member scheduled for:21 days with PharmD to follow up on application, or sooner if patient returns application to clinic  Ruben Reason, PharmD Clinical Pharmacist [facility] [phone]

## 2018-07-17 ENCOUNTER — Other Ambulatory Visit: Payer: Self-pay | Admitting: Family Medicine

## 2018-07-17 NOTE — Telephone Encounter (Signed)
Hypertension medication request: Amlodipine-Benazepril to [facility].   Last office visit pertaining to hypertension: 06/20/2018   BP Readings from Last 3 Encounters:  06/20/18 (!) 142/90  12/08/17 126/78  11/23/17 127/76    Lab Results  Component Value Date   CREATININE 0.67 (L) 06/09/2018   BUN 10 06/09/2018   NA 140 06/09/2018   K 4.4 06/09/2018   CL 104 06/09/2018   CO2 31 06/09/2018     Follow up on 10/10/2018

## 2018-07-18 ENCOUNTER — Other Ambulatory Visit: Payer: Self-pay

## 2018-07-18 DIAGNOSIS — I10 Essential (primary) hypertension: Secondary | ICD-10-CM

## 2018-07-18 MED ORDER — AMLODIPINE BESY-BENAZEPRIL HCL 5-20 MG PO CAPS: 1.0000 | ORAL_CAPSULE | Freq: Every day | ORAL | 1 refills | Status: DC

## 2018-07-18 NOTE — Telephone Encounter (Signed)
Hypertension medication request: Amlodipine-Benazepril to [employer].    Last office visit pertaining to hypertension: 06/09/2018   BP Readings from Last 3 Encounters:  06/20/18 (!) 142/90  12/08/17 126/78  11/23/17 127/76    Lab Results  Component Value Date   CREATININE 0.67 (L) 06/09/2018   BUN 10 06/09/2018   NA 140 06/09/2018   K 4.4 06/09/2018   CL 104 06/09/2018   CO2 31 06/09/2018     Follow up on 10/10/2018

## 2018-09-04 ENCOUNTER — Other Ambulatory Visit: Payer: Self-pay | Admitting: Family Medicine

## 2018-09-12 ENCOUNTER — Other Ambulatory Visit: Payer: Self-pay | Admitting: Family Medicine

## 2018-09-12 DIAGNOSIS — G43009 Migraine without aura, not intractable, without status migrainosus: Secondary | ICD-10-CM

## 2018-09-12 NOTE — Telephone Encounter (Signed)
Refill request for general medication. Promethazine to [place] office visit 06/09/2018   Follow up on 10/10/2018

## 2018-10-03 ENCOUNTER — Other Ambulatory Visit: Payer: Self-pay | Admitting: Family Medicine

## 2018-10-03 DIAGNOSIS — G43009 Migraine without aura, not intractable, without status migrainosus: Secondary | ICD-10-CM

## 2018-10-03 NOTE — Telephone Encounter (Signed)
Refill request for general medication. Rizatriptan   Last office visit 06/09/2018   Follow up on 10/10/2018

## 2018-10-10 ENCOUNTER — Other Ambulatory Visit: Payer: Self-pay

## 2018-10-10 ENCOUNTER — Encounter: Payer: Self-pay | Admitting: Family Medicine

## 2018-10-10 ENCOUNTER — Ambulatory Visit: Payer: Self-pay | Admitting: Family Medicine

## 2018-10-10 VITALS — BP 124/72 | HR 81 | Temp 97.9°F | Resp 18 | Ht 68.0 in | Wt 210.4 lb

## 2018-10-10 DIAGNOSIS — I209 Angina pectoris, unspecified: Secondary | ICD-10-CM

## 2018-10-10 DIAGNOSIS — E785 Hyperlipidemia, unspecified: Secondary | ICD-10-CM

## 2018-10-10 DIAGNOSIS — M069 Rheumatoid arthritis, unspecified: Secondary | ICD-10-CM

## 2018-10-10 DIAGNOSIS — M150 Primary generalized (osteo)arthritis: Secondary | ICD-10-CM

## 2018-10-10 DIAGNOSIS — G4733 Obstructive sleep apnea (adult) (pediatric): Secondary | ICD-10-CM

## 2018-10-10 DIAGNOSIS — K219 Gastro-esophageal reflux disease without esophagitis: Secondary | ICD-10-CM

## 2018-10-10 DIAGNOSIS — E1169 Type 2 diabetes mellitus with other specified complication: Secondary | ICD-10-CM

## 2018-10-10 DIAGNOSIS — I700 Atherosclerosis of aorta: Secondary | ICD-10-CM

## 2018-10-10 DIAGNOSIS — G43009 Migraine without aura, not intractable, without status migrainosus: Secondary | ICD-10-CM

## 2018-10-10 DIAGNOSIS — M159 Polyosteoarthritis, unspecified: Secondary | ICD-10-CM

## 2018-10-10 DIAGNOSIS — I10 Essential (primary) hypertension: Secondary | ICD-10-CM

## 2018-10-10 MED ORDER — CARVEDILOL 12.5 MG PO TABS: 12.5000 mg | ORAL_TABLET | Freq: Two times a day (BID) | ORAL | 1 refills | Status: DC

## 2018-10-10 MED ORDER — PRAVASTATIN SODIUM 20 MG PO TABS: 20.0000 mg | ORAL_TABLET | Freq: Every day | ORAL | 1 refills | Status: DC

## 2018-10-10 MED ORDER — KETOROLAC TROMETHAMINE 10 MG PO TABS: 10.0000 mg | ORAL_TABLET | Freq: Four times a day (QID) | ORAL | 0 refills | Status: DC | PRN

## 2018-10-10 MED ORDER — RIZATRIPTAN BENZOATE 10 MG PO TABS: 10.0000 mg | ORAL_TABLET | ORAL | 0 refills | Status: DC | PRN

## 2018-10-10 MED ORDER — AMLODIPINE BESY-BENAZEPRIL HCL 5-20 MG PO CAPS: 1.0000 | ORAL_CAPSULE | Freq: Every day | ORAL | 1 refills | Status: DC

## 2018-10-10 MED ORDER — PROMETHAZINE HCL 12.5 MG PO TABS: 12.5000 mg | ORAL_TABLET | Freq: Three times a day (TID) | ORAL | 0 refills | Status: DC | PRN

## 2018-10-10 MED ORDER — OMEPRAZOLE 20 MG PO CPDR: 20.0000 mg | DELAYED_RELEASE_CAPSULE | Freq: Every day | ORAL | 1 refills | Status: DC

## 2018-10-10 NOTE — Progress Notes (Signed)
Name: Tim Anderson   MRN: [id number]    DOB: [birth year]   Date:10/10/2018       Progress Note  Subjective  Chief Complaint  Chief Complaint  Patient presents with  . Hypertension    4 month recheck, medication refill  . Hyperlipidemia    HPI  Abnormal CT chest: order as a repeat for incidental findings on CT abdomen for renal evaluation. Discussed results with patient and recommend evaluation by pulmonologist during his visit in April 2020, he saw Dr. Mortimer Fries on 06/20/2018, he was diagnosed with pulmonary fibrosis, COPD and advised to quit smoking, he thinks the nodule is a mucus plug  IMPRESSION: 1. Solid 5 mm right lower lobe pulmonary nodule is stable since 11/14/2017 chest CT, probably benign. 2. Right middle lobe 6 mm solid pulmonary nodule, not previously imaged. 3. Nonspecific mild mediastinal and right hilar lymphadenopathy. 4. Nonspecific patchy subpleural ground-glass attenuation and reticulation in the lungs without bronchiectasis or honeycombing. Imaging appearance favors nonspecific smoking related fibrosis, however a developing interstitial lung disease including UIP cannot be excluded. 5. Suggest attention to all of these findings on a follow-up dedicated high-resolution chest CT study in 6 months. 6. Three-vessel coronary atherosclerosis.   Angina he states had a stress test in [place] to left hip surgery, last episode of chest pain was 2 months and took one dose of NTG and symptoms resolved, denies decrease in exercise tolerance. Three vessel coronary atherosclerosis on CT chest . Currently on aspirin and pravastatin, tolerating it better than atorvastatin   GERD: he is on medication and symptoms are controlledhe has been taking medication daily, states when skipping symptoms returns.Needs refill of Omeprazole   OSA: he has not been using CPAP machine anymore. He states he stopped about7years ago because he was feeling claustrophobic.  Discussed increase in risk of MI and strokes. He cannot afford repeat study at this time, no insurance. He states he recently tried using it again but unable to tolerate it   Migraine/headaches: he has dull ache/tension type headachedull frontal headacheat least once a week, he takes Toradol prn for that. He has migraine seldom, nuchal and associated with nausea and vomiting. Needs refills of medications  RA: sees Rheumatologist, off Enbrelon Plaquenilgiven by Dr. Jefm Bryant. He had a steroid injection on his left knee yesterday All joints aching but worse on his hands to synovitis today, left knee is also bothersome   HTN:he denies recent episodes of chest pain or palpitation.BP today was 124/72 today   DMII: diagnosed Nov 2016, he is on diet only, gained a lot of weight since he quit smoking 11/2016, he resumed smoking last year but weight is still up. He has noticed  polyphagia, polydipsia, polyuria..We will recheck hgbA1C today last level was at goal .He has ED and takes Revatio prn - that causes diarrhea the following day - stable and on lower dose now.Also has Dyslipidemia with very low HDL, we gave him pravastatin back in April 2020 and he is tolerating   Hyperlipidemia: he stopped Lipitor and Crestor because of muscle aches. He has low HDL , discussed CT results, he was re-started on statin on his last visit, pravastatin    Patient Active Problem List   Diagnosis Date Noted  . Atherosclerosis of aorta ([place]) 06/09/2018  . Mediastinal lymphadenopathy 06/09/2018  . Lung nodules 06/09/2018  . Arthralgia of left temporomandibular joint 12/08/2017  . Mucopurulent chronic bronchitis ([place]) 12/08/2017  . Mild left ventricular systolic dysfunction [id number]  . Grade II diastolic  dysfunction 07/05/2017  . Mild mitral regurgitation 07/05/2017  . Mild tricuspid regurgitation 07/05/2017  . Essential hemorrhagic thrombocythemia ([place]) 05/10/2017  . History of left hip replacement  12/27/2016  . Angina pectoris ([place]) 10/27/2016  . Carpal tunnel syndrome on both sides 04/07/2016  . Primary osteoarthritis involving multiple joints 07/29/2015  . Arthritis of knee, degenerative 02/03/2015  . Dyslipidemia associated with type 2 diabetes mellitus ([place]) 01/27/2015  . Benign fibroma of prostate 09/28/2014  . Bronchitis with chronic airway obstruction ([place]) 09/28/2014  . Drug abuse ([place]) 09/28/2014  . Deflected nasal septum 09/28/2014  . Dyslipidemia 09/28/2014  . Dysmetabolic syndrome [id number]  . GERD (gastroesophageal reflux disease) 09/28/2014  . H/O renal calculi 09/28/2014  . Benign hypertension 09/28/2014  . Male hypogonadism 09/28/2014  . Failure of erection 09/28/2014  . Obstructive apnea 09/28/2014  . Depression with anxiety 09/28/2014  . Obesity (BMI 30-39.9) 09/28/2014  . Migraine without aura and responsive to treatment 09/28/2014  . Perennial allergic rhinitis with seasonal variation 09/28/2014  . Elevated platelet count 09/28/2014  . Tobacco abuse 09/28/2014  . Cerebrovascular accident (CVA) ([place]) 07/27/2013  . History of TIA (transient ischemic attack) 09/20/2011  . Rheumatoid arthritis involving multiple joints ([place]) 07/31/2009    Past Surgical History:  Procedure Laterality Date  . FACIAL RECONSTRUCTION SURGERY    . KNEE SURGERY Right     Family History  Problem Relation Age of Onset  . Diabetes Mother   . Diabetes Father   . Heart attack Father   . Cancer Brother        Skin and Prostate-Oldest Brother  . Diabetes Brother     Social History   Socioeconomic History  . Marital status: Married    Spouse name: Lattie Haw   . Number of children: 3  . Years of education: Not on file  . Highest education level: 12th grade  Occupational History  . Occupation: Architect     Comment: self employed   Social Needs  . Financial resource strain: Not on file  . Food insecurity    Worry: Not on file    Inability: Not on file  .  Transportation needs    Medical: Not on file    Non-medical: Not on file  Tobacco Use  . Smoking status: Current Every Day Smoker    Packs/day: 1.00    Years: 39.00    Pack years: 39.00    Types: Cigarettes    Start date: 09/30/1976  . Smokeless tobacco: Never Used  Substance and Sexual Activity  . Alcohol use: Yes    Alcohol/week: 0.0 standard drinks    Comment: occasionally  . Drug use: Not Currently    Types: Marijuana    Comment: used to use cocaine  . Sexual activity: Yes    Partners: Female  Lifestyle  . Physical activity    Days per week: 7 days    Minutes per session: 30 min  . Stress: Not at all  Relationships  . Social connections    Talks on phone: More than three times a week    Gets together: More than three times a week    Attends religious service: 1 to 4 times per year    Active member of club or organization: No    Attends meetings of clubs or organizations: Never    Relationship status: Married  . Intimate partner violence    Fear of current or ex partner: No    Emotionally abused: No    Physically abused:  No    Forced sexual activity: No  Other Topics Concern  . Not on file  Social History Narrative   On his 4th marriage     Current Outpatient Medications:  .  amLODipine-benazepril (LOTREL) 5-20 MG capsule, Take 1 capsule by mouth daily., Disp: 90 capsule, Rfl: 1 .  aspirin EC 81 MG tablet, Take 81 mg by mouth. Reported on 07/29/2015, Disp: , Rfl:  .  carvedilol (COREG) 12.5 MG tablet, Take 1 tablet (12.5 mg total) by mouth 2 (two) times daily with a meal., Disp: 180 tablet, Rfl: 1 .  diclofenac sodium (VOLTAREN) 1 % GEL, APPLY 4 GRAMS TOPICALLY 4 TIMES A DAY., Disp: 300 g, Rfl: 0 .  Fluticasone-Umeclidin-Vilant (TRELEGY ELLIPTA) 100-62.5-25 MCG/INH AEPB, Take 1 puff by mouth daily., Disp: 60 each, Rfl: 5 .  hydroxychloroquine (PLAQUENIL) 200 MG tablet, Take 1 tablet by mouth daily., Disp: , Rfl:  .  ketorolac (TORADOL) 10 MG tablet, Take 1 tablet  (10 mg total) by mouth every 6 (six) hours as needed., Disp: 30 tablet, Rfl: 0 .  omeprazole (PRILOSEC) 20 MG capsule, Take 1 capsule (20 mg total) by mouth daily., Disp: 90 capsule, Rfl: 1 .  pravastatin (PRAVACHOL) 20 MG tablet, Take 1 tablet (20 mg total) by mouth daily., Disp: 90 tablet, Rfl: 1 .  promethazine (PHENERGAN) 12.5 MG tablet, Take 1 tablet (12.5 mg total) by mouth every 8 (eight) hours as needed for nausea or vomiting., Disp: 20 tablet, Rfl: 0 .  rizatriptan (MAXALT) 10 MG tablet, Take 1 tablet (10 mg total) by mouth as needed for migraine., Disp: 10 tablet, Rfl: 0 .  sildenafil (REVATIO) 20 MG tablet, Take 3 to 5 tablets two hours before intercouse on an empty stomach.  Do not take with nitrates., Disp: 50 tablet, Rfl: 3 .  tamsulosin (FLOMAX) 0.4 MG CAPS capsule, Take 1 capsule (0.4 mg total) by mouth daily., Disp: 30 capsule, Rfl: 11  Allergies  Allergen Reactions  . Ciprofloxacin Other (See Comments)    Body aches  . Crestor [Rosuvastatin Calcium] Other (See Comments)    Muscles aches  . Droperidol Other (See Comments)    Heart stopped  . Lipofen  [Fenofibrate]     muscle aches muscle aches  . Niacin Er     flushed    I personally reviewed active problem list, medication list, allergies, family history, social history with the patient/caregiver today.   ROS  Constitutional: Negative for fever or weight change.  Respiratory: Positive  for chronic cough but  shortness of breath.   Cardiovascular: Negative for chest pain or palpitations.  Gastrointestinal: Negative for abdominal pain, no bowel changes.  Musculoskeletal: Negative for gait problem , positive for intermittent  joint swelling.  Skin: Negative for rash.  Neurological: Negative for dizziness , positive for daily  headache.  No other specific complaints in a complete review of systems (except as listed in HPI above).  Objective  Vitals:   10/10/18 0752  BP: 124/72  Pulse: 81  Resp: 18  Temp: 97.9  F (36.6 C)  TempSrc: Oral  SpO2: 99%  Weight: 210 lb 6.4 oz (95.4 kg)  Height: 5\' 8"  (1.727 m)    Body mass index is 31.99 kg/m.  Physical Exam  Constitutional: Patient appears well-developed and well-nourished. Obese  No distress.  HEENT: head atraumatic, normocephalic, pupils equal and reactive to light Cardiovascular: Normal rate, regular rhythm and normal heart sounds.  No murmur heard. No BLE edema. Pulmonary/Chest: Effort normal and breath  sounds normal. No respiratory distress. Abdominal: Soft.  There is no tenderness. Psychiatric: Patient has a normal mood and affect. behavior is normal. Judgment and thought content normal.  Diabetic Foot Exam: Diabetic Foot Exam - Simple   Simple Foot Form Visual Inspection See comments: Yes Sensation Testing Intact to touch and monofilament testing bilaterally: Yes Pulse Check Posterior Tibialis and Dorsalis pulse intact bilaterally: Yes Comments Thick toenails       PHQ2/9: Depression screen [facility] 2/9 10/10/2018 06/09/2018 12/08/2017 11/14/2017 10/27/2016  Decreased Interest 0 0 0 0 0  Down, Depressed, Hopeless 0 0 0 0 0  PHQ - 2 Score 0 0 0 0 0  Altered sleeping 0 0 0 0 -  Tired, decreased energy 0 0 0 0 -  Change in appetite 0 0 0 0 -  Feeling bad or failure about yourself  0 0 0 0 -  Trouble concentrating 0 0 0 0 -  Moving slowly or fidgety/restless 0 0 0 0 -  Suicidal thoughts 0 0 0 0 -  PHQ-9 Score 0 0 0 0 -  Difficult doing work/chores Not difficult at all - Not difficult at all Not difficult at all -    phq 9 is negative   Fall Risk: Fall Risk  10/10/2018 06/09/2018 12/08/2017 11/14/2017 05/10/2017  Falls in the past year? 1 1 No No No  Number falls in past yr: 1 1 - - -  Injury with Fall? 0 1 - - -  Risk for fall due to : History of fall(s) - - - -  Follow up Falls evaluation completed - - - -    Assessment & Plan  1. Essential hypertension  - amLODipine-benazepril (LOTREL) 5-20 MG capsule; Take 1 capsule by  mouth daily.  Dispense: 90 capsule; Refill: 1 - carvedilol (COREG) 12.5 MG tablet; Take 1 tablet (12.5 mg total) by mouth 2 (two) times daily with a meal.  Dispense: 180 tablet; Refill: 1  2. Migraine without aura and responsive to treatment  - ketorolac (TORADOL) 10 MG tablet; Take 1 tablet (10 mg total) by mouth every 6 (six) hours as needed.  Dispense: 30 tablet; Refill: 0 - promethazine (PHENERGAN) 12.5 MG tablet; Take 1 tablet (12.5 mg total) by mouth every 8 (eight) hours as needed for nausea or vomiting.  Dispense: 20 tablet; Refill: 0 - rizatriptan (MAXALT) 10 MG tablet; Take 1 tablet (10 mg total) by mouth as needed for migraine.  Dispense: 10 tablet; Refill: 0  3. Gastroesophageal reflux disease without esophagitis  - omeprazole (PRILOSEC) 20 MG capsule; Take 1 capsule (20 mg total) by mouth daily.  Dispense: 90 capsule; Refill: 1  4. Primary osteoarthritis involving multiple joints  Stable  5. Atherosclerosis of aorta ([facility])  On pravastatin and aspirin  6. Dyslipidemia associated with type 2 diabetes mellitus ([facility])  - pravastatin (PRAVACHOL) 20 MG tablet; Take 1 tablet (20 mg total) by mouth daily.  Dispense: 90 tablet; Refill: 1 - POCT glycosylated hemoglobin (Hb A1C)  7. Obstructive apnea  Discussed CPAP again but unable to tolerate   8. Angina pectoris ([place])   9. Rheumatoid arthritis involving multiple joints ([facility])  Continue follow up with Dr. Jefm Bryant

## 2018-10-11 ENCOUNTER — Encounter: Payer: Self-pay | Admitting: Family Medicine

## 2018-12-04 ENCOUNTER — Other Ambulatory Visit: Payer: Self-pay | Admitting: Urology

## 2018-12-04 DIAGNOSIS — N400 Enlarged prostate without lower urinary tract symptoms: Secondary | ICD-10-CM

## 2018-12-21 ENCOUNTER — Ambulatory Visit
Admission: RE | Admit: 2018-12-21 | Discharge: 2018-12-21 | Disposition: A | Discharge status: Home / Self Care | Payer: Self-pay | Source: Ambulatory Visit | Attending: Internal Medicine | Admitting: Internal Medicine

## 2018-12-21 ENCOUNTER — Other Ambulatory Visit: Payer: Self-pay

## 2018-12-21 DIAGNOSIS — R918 Other nonspecific abnormal finding of lung field: Secondary | ICD-10-CM | POA: Insufficient documentation

## 2018-12-21 LAB — POCT I-STAT CREATININE: Creatinine, Ser: 0.6 mg/dL — ABNORMAL LOW (ref 0.61–1.24)

## 2018-12-21 MED ORDER — IOHEXOL 300 MG/ML  SOLN
75.0000 mL | Freq: Once | INTRAMUSCULAR | Status: AC | PRN
  Administered 2018-12-21: 75 mL via INTRAVENOUS

## 2018-12-27 ENCOUNTER — Other Ambulatory Visit
Admission: RE | Admit: 2018-12-27 | Payer: Self-pay | Source: Ambulatory Visit | Attending: Primary Care | Admitting: Primary Care

## 2018-12-27 ENCOUNTER — Telehealth: Payer: Self-pay | Admitting: Internal Medicine

## 2018-12-27 NOTE — Telephone Encounter (Signed)
Per PAT pt did not show for covid test.  Spoke to pt and confirmed that he did not show. Pt is aware that PFT will need to be rescheduled.   Rhonda, please advise.

## 2018-12-27 NOTE — Telephone Encounter (Signed)
PFT R/S to Tues 01/02/2019 at 5:30. COVID test scheduled for Mon 01/01/2019 at 8:00 am at the [facility]. Pt contacted and advised of the above appointments and he is aware. Nothing else needed at this time. Rhonda J Cobb

## 2018-12-28 ENCOUNTER — Ambulatory Visit: Payer: Self-pay

## 2018-12-29 ENCOUNTER — Other Ambulatory Visit: Payer: Self-pay

## 2018-12-29 ENCOUNTER — Telehealth: Payer: Self-pay | Admitting: Internal Medicine

## 2018-12-29 NOTE — Telephone Encounter (Signed)
Pt is aware of date/time of covid test.   

## 2019-01-01 ENCOUNTER — Other Ambulatory Visit: Payer: Self-pay

## 2019-01-01 ENCOUNTER — Other Ambulatory Visit
Admission: RE | Admit: 2019-01-01 | Discharge: 2019-01-01 | Disposition: A | Discharge status: Home / Self Care | Payer: Self-pay | Source: Ambulatory Visit | Attending: Internal Medicine | Admitting: Internal Medicine

## 2019-01-01 DIAGNOSIS — Z01812 Encounter for preprocedural laboratory examination: Secondary | ICD-10-CM | POA: Insufficient documentation

## 2019-01-01 DIAGNOSIS — Z20828 Contact with and (suspected) exposure to other viral communicable diseases: Secondary | ICD-10-CM | POA: Insufficient documentation

## 2019-01-01 LAB — SARS CORONAVIRUS 2 (TAT 6-24 HRS): SARS Coronavirus 2: NEGATIVE

## 2019-01-02 ENCOUNTER — Ambulatory Visit: Payer: Self-pay | Attending: Internal Medicine

## 2019-01-02 ENCOUNTER — Ambulatory Visit: Payer: Self-pay

## 2019-01-02 DIAGNOSIS — J449 Chronic obstructive pulmonary disease, unspecified: Secondary | ICD-10-CM | POA: Insufficient documentation

## 2019-01-02 MED ORDER — ALBUTEROL SULFATE (2.5 MG/3ML) 0.083% IN NEBU
2.5000 mg | INHALATION_SOLUTION | Freq: Once | RESPIRATORY_TRACT | Status: AC
  Administered 2019-01-02: 2.5 mg via RESPIRATORY_TRACT
  Filled 2019-01-02: qty 3

## 2019-01-05 ENCOUNTER — Ambulatory Visit (INDEPENDENT_AMBULATORY_CARE_PROVIDER_SITE_OTHER): Payer: Self-pay | Admitting: Primary Care

## 2019-01-05 ENCOUNTER — Encounter: Payer: Self-pay | Admitting: Primary Care

## 2019-01-05 ENCOUNTER — Other Ambulatory Visit: Payer: Self-pay

## 2019-01-05 VITALS — BP 130/70 | HR 98 | Temp 97.3°F | Ht 68.0 in | Wt 226.4 lb

## 2019-01-05 DIAGNOSIS — R911 Solitary pulmonary nodule: Secondary | ICD-10-CM

## 2019-01-05 DIAGNOSIS — J84115 Respiratory bronchiolitis interstitial lung disease: Secondary | ICD-10-CM

## 2019-01-05 DIAGNOSIS — R918 Other nonspecific abnormal finding of lung field: Secondary | ICD-10-CM

## 2019-01-05 DIAGNOSIS — J849 Interstitial pulmonary disease, unspecified: Secondary | ICD-10-CM

## 2019-01-05 DIAGNOSIS — J449 Chronic obstructive pulmonary disease, unspecified: Secondary | ICD-10-CM

## 2019-01-05 MED ORDER — IPRATROPIUM-ALBUTEROL 20-100 MCG/ACT IN AERS: 1.0000 | INHALATION_SPRAY | Freq: Four times a day (QID) | RESPIRATORY_TRACT | 2 refills | Status: DC | PRN

## 2019-01-05 MED ORDER — FLUTTER DEVI: 0 refills | Status: DC

## 2019-01-05 NOTE — Patient Instructions (Addendum)
Pleasure meeting you Mr. Buchmann, congratulations on quitting smoking   Recommendations: - Stop Trelegy d/t thrush and cost  - Start Combivent respimat- this is a rescue inhaler, use 1 puff every 6 hours as needed for shortness of breath/wheezing - Continue mucinex 600mg  twice daily  - Use flutter valve three times a day for congestion  - If you find yourself using rescue inhaler more frequently let office know and may resume daily maintenance inhaler - Do not restart smoking, refer to yourself as an ex smoker when speaking to friends and family  Orders: HRCT in 12 months re: pulmonary nodules and ILD  Follow up: 6 months with Dr. Mortimer Fries

## 2019-01-05 NOTE — Assessment & Plan Note (Signed)
-   CT chest shows reticulation with areas of ground-glass attenuation in lungs bilaterally. Potentially smoking related but early ILD not excluded - Patient quit smoking 2 weeks ago - FU HRCT in 12 months, if RB-ILD expect findings to improve with smoking cessation

## 2019-01-05 NOTE — Progress Notes (Signed)
@Patient  ID: Tim Anderson, male    DOB: 10-14-1965, 53 y.o.   MRN: [id number]  Chief Complaint  Patient presents with  . Follow-up    review PFT- c/o prod cough with green mucus and occ sob with exertion.     Referring provider: Steele Sizer, MD  HPI: 53 year old male, current every day smoker. PMH significant for obstructive sleep apnea, lung nodules, COPD, GERD, mediastinal lymphadenopathy, benign hypertension, CVA, left ventricular systolic dysfunction, dyslipidemia, rheumatoid arthritis. Patient of Dr. Mortimer Fries, seen for initial consult on 06/20/18 for abnormal CT chest which showed small subcentimeter 8x36mm RML lung nodules.  01/05/2019 Patient presents today for 6 month follow-up. Repeat CT chest showed stable bilateral pulmonary nodules measuring up to 91mm. RML lung nodule unchanged in size. Mild mediastinal lymphadenopathy is similar to prior. Subpleural reticulation with areas of ground-glass attenuation in lungs bilaterally. Potentially smoking related but early interstitial lung disease is not excluded. Recommend repeat non-contrast CT in 12 months.    He is doing well. Reports no noticeable improvement with Trelegy, currently only taking inhaler as needed. Reports thrush symptoms. Still has occasional cough, states that he barely coughs up mucus but when he does it's green. Experiences shortness of breath mainly going up stairs. States that this is the most he has weighed before. Quit smoking two weeks ago, however, he did have one cigarette yesterday.  PFTs 01/02/19- FVC 3.73 (81%), FEV1 2.92 (82%), ratio 78. No BD response. Normal DLCO, Normal flow volume loop.   Allergies  Allergen Reactions  . Ciprofloxacin Other (See Comments)    Body aches  . Crestor [Rosuvastatin Calcium] Other (See Comments)    Muscles aches  . Droperidol Other (See Comments)    Heart stopped  . Lipofen  [Fenofibrate]     muscle aches muscle aches  . Niacin Er     flushed    Immunization  History  Administered Date(s) Administered  . Influenza Split 11/11/2009, 02/22/2012  . Influenza, Seasonal, Injecte, Preservative Fre 11/18/2010  . Influenza,inj,Quad PF,6+ Mos 10/01/2014, 11/04/2014, 10/29/2015, 12/28/2016, 12/08/2017  . Influenza-Unspecified 10/11/2013  . Pneumococcal Conjugate-13 07/05/2016  . Pneumococcal Polysaccharide-23 04/25/2009, 11/04/2014  . Tdap 04/25/2009    Past Medical History:  Diagnosis Date  . Allergy   . BPH (benign prostatic hyperplasia)   . COPD (chronic obstructive pulmonary disease) ([place])   . Drug abuse ([place])   . Dysmetabolic syndrome X   . Erosive gastritis   . History of hip replacement 12/2016  . History of knee replacement 01/2015  . History of left hip replacement 12/27/2016  . Hyperlipidemia   . Hypertension   . Hypogonadism male   . Obesity   . Obsessive compulsive disorder   . OSA (obstructive sleep apnea)   . Radiculitis   . Stomatitis monilial   . Thrombocytosis (HCC)     Tobacco History: Social History   Tobacco Use  Smoking Status Current Some Day Smoker  . Packs/day: 1.00  . Years: 39.00  . Pack years: 39.00  . Types: Cigarettes  . Start date: 09/30/1976  Smokeless Tobacco Never Used  Tobacco Comment   1 pack with last a month-01/05/2019   Ready to quit: Not Answered Counseling given: Not Answered Comment: 1 pack with last a month-01/05/2019   Outpatient Medications Prior to Visit  Medication Sig Dispense Refill  . amLODipine-benazepril (LOTREL) 5-20 MG capsule Take 1 capsule by mouth daily. 90 capsule 1  . aspirin EC 81 MG tablet Take 81 mg by mouth.  Reported on 07/29/2015    . carvedilol (COREG) 12.5 MG tablet Take 1 tablet (12.5 mg total) by mouth 2 (two) times daily with a meal. 180 tablet 1  . diclofenac sodium (VOLTAREN) 1 % GEL APPLY 4 GRAMS TOPICALLY 4 TIMES A DAY. 300 g 0  . Fluticasone-Umeclidin-Vilant (TRELEGY ELLIPTA) 100-62.5-25 MCG/INH AEPB Take 1 puff by mouth daily. 60 each 5  .  hydroxychloroquine (PLAQUENIL) 200 MG tablet Take 1 tablet by mouth daily.    Marland Kitchen ketorolac (TORADOL) 10 MG tablet Take 1 tablet (10 mg total) by mouth every 6 (six) hours as needed. 30 tablet 0  . omeprazole (PRILOSEC) 20 MG capsule Take 1 capsule (20 mg total) by mouth daily. 90 capsule 1  . pravastatin (PRAVACHOL) 20 MG tablet Take 1 tablet (20 mg total) by mouth daily. 90 tablet 1  . promethazine (PHENERGAN) 12.5 MG tablet Take 1 tablet (12.5 mg total) by mouth every 8 (eight) hours as needed for nausea or vomiting. 20 tablet 0  . rizatriptan (MAXALT) 10 MG tablet Take 1 tablet (10 mg total) by mouth as needed for migraine. 10 tablet 0  . sildenafil (REVATIO) 20 MG tablet Take 3 to 5 tablets two hours before intercouse on an empty stomach.  Do not take with nitrates. 50 tablet 3  . tamsulosin (FLOMAX) 0.4 MG CAPS capsule Take 1 capsule (0.4 mg total) by mouth daily. 30 capsule 3   No facility-administered medications prior to visit.    Review of Systems  Review of Systems  Constitutional: Negative.   Respiratory: Positive for cough and shortness of breath. Negative for chest tightness and wheezing.   Cardiovascular: Negative.    Physical Exam  BP 130/70 (BP Location: Left Arm, Cuff Size: Normal)   Pulse 98   Temp (!) 97.3 F (36.3 C) (Temporal)   Ht 5\' 8"  (1.727 m)   Wt 226 lb 6.4 oz (102.7 kg)   SpO2 97%   BMI 34.42 kg/m  Physical Exam Constitutional:      Appearance: Normal appearance.  HENT:     Head: Normocephalic and atraumatic.  Cardiovascular:     Rate and Rhythm: Regular rhythm.  Pulmonary:     Effort: Pulmonary effort is normal.     Breath sounds: Normal breath sounds. No wheezing or rhonchi.     Comments: CTA Skin:    General: Skin is warm and dry.  Neurological:     General: No focal deficit present.     Mental Status: He is alert and oriented to person, place, and time. Mental status is at baseline.  Psychiatric:        Mood and Affect: Mood normal.         Behavior: Behavior normal.        Thought Content: Thought content normal.        Judgment: Judgment normal.      Lab Results:  CBC    Component Value Date/Time   WBC 17.9 (H) 11/14/2017 1920   RBC 5.03 11/14/2017 1920   HGB 12.3 (L) 11/14/2017 1920   HGB 12.3 (L) 07/05/2016 0837   HCT 36.4 (L) 11/14/2017 1920   HCT 39.6 07/05/2016 0837   PLT 391 11/14/2017 1920   PLT 370 07/05/2016 0837   MCV 72.4 (L) 11/14/2017 1920   MCV 76 (L) 07/05/2016 0837   MCV 84 10/23/2012 1319   MCH 24.5 (L) 11/14/2017 1920   MCHC 33.9 11/14/2017 1920   RDW 16.5 (H) 11/14/2017 1920   RDW 16.4 (  H) 07/05/2016 0837   RDW 19.7 (H) 10/23/2012 1319   LYMPHSABS 3.5 11/14/2017 1920   LYMPHSABS 2.8 07/05/2016 0837   LYMPHSABS 3.9 (H) 08/29/2011 0453   MONOABS 1.5 (H) 11/14/2017 1920   MONOABS 0.6 08/29/2011 0453   EOSABS 0.1 11/14/2017 1920   EOSABS 0.2 07/05/2016 0837   EOSABS 0.2 08/29/2011 0453   BASOSABS 0.2 (H) 11/14/2017 1920   BASOSABS 0.1 07/05/2016 0837   BASOSABS 0.1 08/29/2011 0453    BMET    Component Value Date/Time   NA 140 06/09/2018 0829   NA 139 07/05/2016 0837   NA 135 (L) 10/23/2012 1319   K 4.4 06/09/2018 0829   K 4.0 10/23/2012 1319   CL 104 06/09/2018 0829   CL 103 10/23/2012 1319   CO2 31 06/09/2018 0829   CO2 26 10/23/2012 1319   GLUCOSE 131 (H) 06/09/2018 0829   GLUCOSE 115 (H) 10/23/2012 1319   BUN 10 06/09/2018 0829   BUN 16 07/05/2016 0837   BUN 20 (H) 10/23/2012 1319   CREATININE 0.60 (L) 12/21/2018 0807   CREATININE 0.67 (L) 06/09/2018 0829   CALCIUM 9.0 06/09/2018 0829   CALCIUM 8.9 10/23/2012 1319   GFRNONAA 108 06/09/2018 0829   GFRAA 125 06/09/2018 0829    BNP No results found for: BNP  ProBNP No results found for: PROBNP  Imaging: Ct Chest W Contrast  Result Date: 12/21/2018 CLINICAL DATA:  Intermittent dry cough and shortness of breath on exertion. 5 mm right lower lobe pulmonary nodule seen on previous CT. EXAM: CT CHEST WITH CONTRAST  TECHNIQUE: Multidetector CT imaging of the chest was performed during intravenous contrast administration. CONTRAST:  52mL OMNIPAQUE IOHEXOL 300 MG/ML  SOLN COMPARISON:  06/08/2018 FINDINGS: Cardiovascular: The heart size is normal. No substantial pericardial effusion. Coronary artery calcification is evident. Atherosclerotic calcification is noted in the wall of the thoracic aorta. Mediastinum/Nodes: Similar appearance mild mediastinal lymphadenopathy. AP window node measuring 12 mm today (55/2 was 10 mm when I remeasure on prior study. Previously measured 10 mm AP window lymph node is stable at 10 mm today (56/2) the lower right paratracheal node measured previously 11 mm short axis is 13 mm today. Lymphoid tissue noted in both hilar regions, better demonstrated on today's postcontrast study. The esophagus has normal imaging features. 9 mm index node in the right axilla measured previously is stable at 9 mm. Lungs/Pleura: Subpleural reticulation again noted in the lungs bilaterally. Areas of ground-glass attenuation again noted. Changes of paraseptal emphysema again noted. 6 mm medial right upper lobe subpleural nodule on 46/3 is stable. 8 mm perifissural nodule posterior right middle lobe (71/3) is unchanged. 5 mm posterior right lower lobe nodule (107/3) is stable. 5 mm perifissural nodule posterior left upper lobe (72/3), unchanged. No focal airspace consolidation. No pulmonary edema or pleural effusion. Upper Abdomen: Unremarkable. Musculoskeletal: No worrisome lytic or sclerotic osseous abnormality. IMPRESSION: 1. Stable exam.  No new or progressive interval findings. 2. Stable appearance bilateral pulmonary nodules measuring up to 8 mm in the 6 months since prior study. Non-contrast chest CT at 12-18 months (from today's scan) is considered optional for low-risk patients, but is recommended for high-risk patients. This recommendation follows the consensus statement: Guidelines for Management of Incidental  Pulmonary Nodules Detected on CT Images: From the Fleischner Society 2017; Radiology 2017; 284:228-243. 3. The mild mediastinal lymphadenopathy is similar to prior. Attention on follow-up recommended. 4. Subpleural reticulation with areas of ground-glass attenuation in the lungs bilaterally. Potentially smoking related lung disease.  As noted previously, early interstitial lung disease not excluded. 5.  Aortic Atherosclerois (ICD10-170.0) Electronically Signed   By: Misty Stanley M.D.   On: 12/21/2018 09:22     Assessment & Plan:   Bronchitis with chronic airway obstruction (HCC) - No clear evidence of COPD on PFTs; FEV1 2.92 (82%), ratio 78 - Reports no benefit from Trelegy and side effects for thrush - Plan discontinue maintenance inhaler for now and give patient prn combivent respimat  - Recommend mucinex 600mg  twice daily for congestion - Congratulated on quitting smoking, encouraged continued cessation - Recommend patient work on weight loss and increasing physical activity   Lung nodules - Repeat CT chest showed stable bilateral pulmonary nodules, measuring up to 46mm. RML lung nodule unchanged in size.   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 Potentially smoking related but early ILD not excluded - Patient quit smoking 2 weeks ago - FU HRCT in 12 months, if RB-ILD expect findings to improve with smoking cessation    Martyn Ehrich, NP 01/05/2019

## 2019-01-05 NOTE — Assessment & Plan Note (Signed)
-   Repeat CT chest showed stable bilateral pulmonary nodules, measuring up to 58mm. RML lung nodule unchanged in size.

## 2019-01-05 NOTE — Assessment & Plan Note (Signed)
-   No clear evidence of COPD on PFTs; FEV1 2.92 (82%), ratio 78 - Reports no benefit from Trelegy and side effects for thrush - Plan discontinue maintenance inhaler for now and give patient prn combivent respimat  - Recommend mucinex 600mg  twice daily for congestion - Congratulated on quitting smoking, encouraged continued cessation - Recommend patient work on weight loss and increasing physical activity

## 2019-01-15 ENCOUNTER — Other Ambulatory Visit: Payer: Self-pay | Admitting: Family Medicine

## 2019-01-15 DIAGNOSIS — G43009 Migraine without aura, not intractable, without status migrainosus: Secondary | ICD-10-CM

## 2019-01-19 ENCOUNTER — Other Ambulatory Visit: Payer: Self-pay | Admitting: Family Medicine

## 2019-01-19 DIAGNOSIS — G43009 Migraine without aura, not intractable, without status migrainosus: Secondary | ICD-10-CM

## 2019-03-06 ENCOUNTER — Other Ambulatory Visit: Payer: Self-pay | Admitting: Urology

## 2019-03-06 ENCOUNTER — Other Ambulatory Visit: Payer: Self-pay

## 2019-03-06 ENCOUNTER — Emergency Department: Payer: Self-pay

## 2019-03-06 ENCOUNTER — Emergency Department
Admission: EM | Admit: 2019-03-06 | Discharge: 2019-03-06 | Disposition: A | Discharge status: Home / Self Care | Payer: Self-pay | Attending: Student | Admitting: Student

## 2019-03-06 DIAGNOSIS — F1210 Cannabis abuse, uncomplicated: Secondary | ICD-10-CM | POA: Insufficient documentation

## 2019-03-06 DIAGNOSIS — Y929 Unspecified place or not applicable: Secondary | ICD-10-CM | POA: Insufficient documentation

## 2019-03-06 DIAGNOSIS — Y999 Unspecified external cause status: Secondary | ICD-10-CM | POA: Insufficient documentation

## 2019-03-06 DIAGNOSIS — S61211A Laceration without foreign body of left index finger without damage to nail, initial encounter: Secondary | ICD-10-CM | POA: Insufficient documentation

## 2019-03-06 DIAGNOSIS — Z7982 Long term (current) use of aspirin: Secondary | ICD-10-CM | POA: Insufficient documentation

## 2019-03-06 DIAGNOSIS — I10 Essential (primary) hypertension: Secondary | ICD-10-CM | POA: Insufficient documentation

## 2019-03-06 DIAGNOSIS — Z8673 Personal history of transient ischemic attack (TIA), and cerebral infarction without residual deficits: Secondary | ICD-10-CM | POA: Insufficient documentation

## 2019-03-06 DIAGNOSIS — Z23 Encounter for immunization: Secondary | ICD-10-CM | POA: Insufficient documentation

## 2019-03-06 DIAGNOSIS — J449 Chronic obstructive pulmonary disease, unspecified: Secondary | ICD-10-CM | POA: Insufficient documentation

## 2019-03-06 DIAGNOSIS — S61213A Laceration without foreign body of left middle finger without damage to nail, initial encounter: Secondary | ICD-10-CM | POA: Insufficient documentation

## 2019-03-06 DIAGNOSIS — Z79899 Other long term (current) drug therapy: Secondary | ICD-10-CM | POA: Insufficient documentation

## 2019-03-06 DIAGNOSIS — F17210 Nicotine dependence, cigarettes, uncomplicated: Secondary | ICD-10-CM | POA: Insufficient documentation

## 2019-03-06 DIAGNOSIS — W312XXA Contact with powered woodworking and forming machines, initial encounter: Secondary | ICD-10-CM | POA: Insufficient documentation

## 2019-03-06 DIAGNOSIS — Y939 Activity, unspecified: Secondary | ICD-10-CM | POA: Insufficient documentation

## 2019-03-06 MED ORDER — CEPHALEXIN 500 MG PO CAPS: 500.0000 mg | ORAL_CAPSULE | Freq: Two times a day (BID) | ORAL | 0 refills | Status: DC

## 2019-03-06 MED ORDER — BACITRACIN ZINC 500 UNIT/GM EX OINT
TOPICAL_OINTMENT | Freq: Once | CUTANEOUS | Status: AC
  Administered 2019-03-06: 1 via TOPICAL

## 2019-03-06 MED ORDER — TETANUS-DIPHTH-ACELL PERTUSSIS 5-2.5-18.5 LF-MCG/0.5 IM SUSP
0.5000 mL | Freq: Once | INTRAMUSCULAR | Status: AC
  Administered 2019-03-06: 0.5 mL via INTRAMUSCULAR
  Filled 2019-03-06: qty 0.5

## 2019-03-06 MED ORDER — LIDOCAINE HCL (PF) 1 % IJ SOLN
20.0000 mL | Freq: Once | INTRAMUSCULAR | Status: AC
  Administered 2019-03-06: 20 mL
  Filled 2019-03-06: qty 20

## 2019-03-06 NOTE — ED Provider Notes (Signed)
[facility] Emergency Department Provider Note  ____________________________________________   First MD Initiated Contact with Patient 03/06/19 1835     (approximate)  I have reviewed the triage vital signs and the nursing notes.  History  Chief Complaint Laceration    HPI Tim Anderson is a 54 y.o. male who presents for lacerations to his left second and third digit, distal aspect.  Injury occurred around 3 PM this afternoon when he was using a table saw. His hand accidentally pulled into the machine, causing laceration to the distal fingers.  Bleeding is controlled on arrival.  No nail involvement.  He denies any weakness/numbness/tingling.  Able to wiggle his fingers.  Pain is sharp/stinging, 7/10 in severity, located to the distal fingers at the area of the laceration.  No radiation.  No alleviating/aggravating factors.  Immediately cleaned it with water and iodine.  Initially seen in the [facility], referred to the ED for x-ray to rule out any bony involvement.  He is right-handed.  Unsure of his last tetanus shot.   Past Medical Hx Past Medical History:  Diagnosis Date  . Allergy   . BPH (benign prostatic hyperplasia)   . COPD (chronic obstructive pulmonary disease) ([place])   . Drug abuse ([place])   . Dysmetabolic syndrome X   . Erosive gastritis   . History of hip replacement 12/2016  . History of knee replacement 01/2015  . History of left hip replacement 12/27/2016  . Hyperlipidemia   . Hypertension   . Hypogonadism male   . Obesity   . Obsessive compulsive disorder   . OSA (obstructive sleep apnea)   . Radiculitis   . Stomatitis monilial   . Thrombocytosis ([place])     Problem List Patient Active Problem List   Diagnosis Date Noted  . 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 Atherosclerosis of aorta ([place]) 06/09/2018  . Mediastinal lymphadenopathy 06/09/2018  . Lung nodules  06/09/2018  . Arthralgia of left temporomandibular joint 12/08/2017  . Mucopurulent chronic bronchitis ([place]) 12/08/2017  . Mild left ventricular systolic dysfunction [id number]  . Grade II diastolic dysfunction [id number]  . Mild mitral regurgitation 07/05/2017  . Mild tricuspid regurgitation 07/05/2017  . Essential hemorrhagic thrombocythemia ([place]) 05/10/2017  . History of left hip replacement 12/27/2016  . Angina pectoris ([place]) 10/27/2016  . Carpal tunnel syndrome on both sides 04/07/2016  . Primary osteoarthritis involving multiple joints 07/29/2015  . Arthritis of knee, degenerative 02/03/2015  . Dyslipidemia associated with type 2 diabetes mellitus ([place]) 01/27/2015  . Benign fibroma of prostate 09/28/2014  . Bronchitis with chronic airway obstruction ([place]) 09/28/2014  . Drug abuse ([place]) 09/28/2014  . Deflected nasal septum 09/28/2014  . Dyslipidemia 09/28/2014  . Dysmetabolic syndrome [id number]  . GERD (gastroesophageal reflux disease) 09/28/2014  . H/O renal calculi 09/28/2014  . Benign hypertension 09/28/2014  . Male hypogonadism 09/28/2014  . Failure of erection 09/28/2014  . Obstructive apnea 09/28/2014  . Depression with anxiety 09/28/2014  . Obesity (BMI 30-39.9) 09/28/2014  . Migraine without aura and responsive to treatment 09/28/2014  . Perennial allergic rhinitis with seasonal variation 09/28/2014  . Elevated platelet count 09/28/2014  . Tobacco abuse 09/28/2014  . Cerebrovascular accident (CVA) ([place]) 07/27/2013  . History of TIA (transient ischemic attack) 09/20/2011  . Rheumatoid arthritis involving multiple joints ([place]) 07/31/2009    Past Surgical Hx Past Surgical History:  Procedure Laterality Date  . FACIAL RECONSTRUCTION SURGERY    . KNEE SURGERY Right  Medications Prior to Admission medications   Medication Sig Start Date End Date Taking? Authorizing Provider  amLODipine-benazepril (LOTREL) 5-20 MG capsule Take 1 capsule by mouth daily. 10/10/18    Steele Sizer, MD  aspirin EC 81 MG tablet Take 81 mg by mouth. Reported on 07/29/2015 10/26/11   [provider]  carvedilol (COREG) 12.5 MG tablet Take 1 tablet (12.5 mg total) by mouth 2 (two) times daily with a meal. 10/10/18   Sowles, Drue Stager, MD  diclofenac sodium (VOLTAREN) 1 % GEL APPLY 4 GRAMS TOPICALLY 4 TIMES A DAY. 12/08/17   Steele Sizer, MD  hydroxychloroquine (PLAQUENIL) 200 MG tablet Take 1 tablet by mouth daily. 01/15/16   Emmaline Kluver., MD  Ipratropium-Albuterol (COMBIVENT) 20-100 MCG/ACT AERS respimat Inhale 1 puff into the lungs every 6 (six) hours as needed for wheezing. 01/05/19   Martyn Ehrich, NP  ketorolac (TORADOL) 10 MG tablet TAKE 1 TABLET BY MOUTH EVERY 6 HOURS AS NEEDED 01/15/19   Steele Sizer, MD  omeprazole (PRILOSEC) 20 MG capsule Take 1 capsule (20 mg total) by mouth daily. 10/10/18   Steele Sizer, MD  pravastatin (PRAVACHOL) 20 MG tablet Take 1 tablet (20 mg total) by mouth daily. 10/10/18   Steele Sizer, MD  promethazine (PHENERGAN) 12.5 MG tablet Take 1 tablet (12.5 mg total) by mouth every 8 (eight) hours as needed for nausea or vomiting. 10/10/18   Steele Sizer, MD  Respiratory Therapy Supplies (FLUTTER) DEVI Use as directed. 01/05/19   Martyn Ehrich, NP  rizatriptan (MAXALT) 10 MG tablet Take 1 tablet (10 mg total) by mouth as needed for migraine. 10/10/18   Steele Sizer, MD  sildenafil (REVATIO) 20 MG tablet Take 3 to 5 tablets two hours before intercouse on an empty stomach.Do not take with nitrates. 03/06/19   Zara Council A, PA-C  tamsulosin (FLOMAX) 0.4 MG CAPS capsule Take 1 capsule (0.4 mg total) by mouth daily. 12/04/18   Zara Council A, PA-C    Allergies Ciprofloxacin, Crestor [rosuvastatin calcium], Droperidol, Lipofen  [fenofibrate], and Niacin er  Family Hx Family History  Problem Relation Age of Onset  . Diabetes Mother   . Diabetes Father   . Heart attack Father   . Cancer Brother         Skin and Prostate-Oldest Brother  . Diabetes Brother     Social Hx Social History   Tobacco Use  . Smoking status: Current Some Day Smoker    Packs/day: 1.00    Years: 39.00    Pack years: 39.00    Types: Cigarettes    Start date: 09/30/1976  . Smokeless tobacco: Never Used  . Tobacco comment: 1 pack with last a month-01/05/2019  Substance Use Topics  . Alcohol use: Yes    Alcohol/week: 2.0 standard drinks    Types: 2 Cans of beer per week    Comment: occasionally  . Drug use: Not Currently    Types: Marijuana    Comment: used to use cocaine     Review of Systems  Constitutional: Negative for fever, chills. Eyes: Negative for visual changes. ENT: Negative for sore throat. Cardiovascular: Negative for chest pain. Respiratory: Negative for shortness of breath. Gastrointestinal: Negative for nausea, vomiting.  Genitourinary: Negative for dysuria. Musculoskeletal: Negative for leg swelling. Skin: + laceration Neurological: Negative for for headaches.   Physical Exam  Vital Signs: ED Triage Vitals  Enc Vitals Group     BP 03/06/19 1804 (!) 164/85     Pulse Rate 03/06/19  1804 98     Resp 03/06/19 1804 18     Temp 03/06/19 1804 98.6 F (37 C)     Temp Source 03/06/19 1804 Oral     SpO2 03/06/19 1804 97 %     Weight 03/06/19 1805 220 lb (99.8 kg)     Height 03/06/19 1805 5\' 7"  (1.702 m)     Head Circumference --      Peak Flow --      Pain Score 03/06/19 1805 7     Pain Loc --      Pain Edu? --      Excl. in [place]? --     Constitutional: Alert and oriented.  Head: Normocephalic. Atraumatic. Eyes: Conjunctivae clear. Sclera anicteric. Nose: No congestion. No rhinorrhea. Mouth/Throat: Wearing mask.  Neck: No stridor.   Cardiovascular: Normal rate, regular rhythm. Extremities well perfused. Fingers WWP. Cap refill less than 2 seconds. 2+ radial pulse.  Respiratory: Normal respiratory effort.   Musculoskeletal:  - LUE/hand: Able to flex and extend at each digit  MCP/PIP/DIP without difficulty.  Motor/sensation intact in radial, ulnar, median distribution.  Sensation intact at the distal finger tips throughout.  Cap refill less than 2 seconds.  2+ radial pulse.  Grip strength intact. Wounds explored in bloodless field - no foreign body, obvious bony damage or tendon damage. No nail damage. See skin below for lacerations. Neurologic:  Normal speech and language. No gross focal neurologic deficits are appreciated. R and L sided grip strength 5/5 and symmetric. Motor/sensation in tact in radial, ulnar, median distribution.  Skin:  Two similar appearing lacerations to the distal fingertip of the 2nd and 3rd finger, palmar aspect, ulnar side. ~2 cm, curved/L shaped, irregular and jagged with some soft tissue defect as well. Psychiatric: Mood and affect are appropriate for situation.    Radiology  XR: IMPRESSION:  Soft tissue injuries to the distal second and third digits. No  fracture or dislocation. No joint space narrowing or erosion.    Procedures  Procedure(s) performed (including critical care):  Marland KitchenMarland KitchenLaceration Repair  Date/Time: 03/06/2019 8:32 PM Performed by: Lilia Pro., MD Authorized by: Lilia Pro., MD   Consent:    Consent obtained:  Verbal   Consent given by:  Patient   Risks discussed:  Infection, pain, poor cosmetic result, poor wound healing and tendon damage Anesthesia (see MAR for exact dosages):    Anesthesia method:  Nerve block   Block needle gauge:  25 G   Block anesthetic:  Lidocaine 1% w/o epi   Block technique:  Finger digital block - 2nd and 3rd digit on left   Block injection procedure:  Anatomic landmarks identified, introduced needle, negative aspiration for blood and incremental injection   Block outcome:  Anesthesia achieved Laceration details:    Location:  Finger   Finger location: L index and long finger.   Wound length (cm): each digits laceration ~2 cm, irregular and jagged. Repair type:    Repair  type:  Simple Exploration:    Hemostasis achieved with:  Direct pressure   Wound exploration: wound explored through full range of motion and entire depth of wound probed and visualized     Wound extent: no foreign bodies/material noted, no tendon damage noted and no underlying fracture noted     Contaminated: no   Treatment:    Wound cleansed with: chlorhexidine.   Amount of cleaning:  Standard   Irrigation solution:  Sterile saline Skin repair:    Repair method:  Sutures   Suture size: R middle finger - 5-0 ethilon. R index finger - 5-0 ethilon x 2 and 6-0 ethilon x 1.   Suture technique:  Simple interrupted   Number of sutures: middle finger x 1. index finger x 3. Post-procedure details:    Dressing:  Antibiotic ointment   Patient tolerance of procedure:  Tolerated well, no immediate complications     Initial Impression / Assessment and Plan / ED Course  54 y.o. male who presents to the ED for finger lacerations, as above.   XR reveals no fracture or dislocation. No nail injury on exam. No evidence of foreign body or tendon damage on exam.  Repaired as above. Some areas of laceration left without suture due to soft tissue defect preventing appropriate repair. Updated tetanus. Will Rx course of Keflex for infection prevention, follow up with orthopedic hand. Discussed wound care and strict return precautions.     Final Clinical Impression(s) / ED Diagnosis  Final diagnoses:  Laceration of left index finger without foreign body without damage to nail, initial encounter  Laceration of left middle finger without foreign body without damage to nail, initial encounter       Note:  This document was prepared using Dragon voice recognition software and may include unintentional dictation errors.   Lilia Pro., MD 03/06/19 2035

## 2019-03-06 NOTE — ED Triage Notes (Signed)
Pt to the er from [facility] for lacs to the 2nd and 3rd digits of the left hand. Pt was using a table saw when it occurred. Per RN, wound shows exposed bone and possible bone involvement. Area has been soaked in saline and iodine.

## 2019-03-06 NOTE — Discharge Instructions (Signed)
Thank you for letting us take care of you in the emergency department today.  We have repaired your cut with stitches that will need to be removed in 10 to 14 days.  Please see the orthopedic doctor or your primary care doctor at that time for removal. Your x-ray was negative for fracture.  Please keep your cuts clean and dry for the first 24 hours.  After this, you may let soap and water run over your cuts, do not scrub it then vigorously.  Apply an antibiotic ointment to it twice a day.  Please continue to take any regular, prescribed medications.   New medications we have prescribed:  - Keflex, to help prevent infection  Please follow up with: - The orthopedic hand doctor to review your ER visit and follow up on your symptoms. Information is below.  Please return to the ER for any new or worsening symptoms, such as abnormal swelling, weakness, numbness, tingling, redness, pus, drainage, fever.

## 2019-03-06 NOTE — ED Notes (Signed)
See triage note presents from [facility] with laceration to left 2nd and 3rd fingers by a table saw

## 2019-03-06 NOTE — ED Notes (Signed)
Wife in car. Lattie Haw [phone]

## 2019-03-06 NOTE — ED Notes (Signed)
Bacitracin and bandages applied,. Instructions given to pt.

## 2019-03-12 ENCOUNTER — Other Ambulatory Visit: Payer: Self-pay

## 2019-03-12 ENCOUNTER — Encounter: Payer: Self-pay | Admitting: Emergency Medicine

## 2019-03-12 ENCOUNTER — Emergency Department: Payer: Self-pay

## 2019-03-12 ENCOUNTER — Inpatient Hospital Stay
Admission: EM | Admit: 2019-03-12 | Discharge: 2019-03-15 | DRG: 251 | Disposition: A | Discharge status: Home / Self Care | Payer: Self-pay | Attending: Internal Medicine | Admitting: Internal Medicine

## 2019-03-12 ENCOUNTER — Ambulatory Visit: Payer: Self-pay

## 2019-03-12 DIAGNOSIS — Z79899 Other long term (current) drug therapy: Secondary | ICD-10-CM

## 2019-03-12 DIAGNOSIS — I519 Heart disease, unspecified: Secondary | ICD-10-CM | POA: Diagnosis present

## 2019-03-12 DIAGNOSIS — I2510 Atherosclerotic heart disease of native coronary artery without angina pectoris: Secondary | ICD-10-CM | POA: Diagnosis present

## 2019-03-12 DIAGNOSIS — G4733 Obstructive sleep apnea (adult) (pediatric): Secondary | ICD-10-CM | POA: Diagnosis present

## 2019-03-12 DIAGNOSIS — Z7951 Long term (current) use of inhaled steroids: Secondary | ICD-10-CM

## 2019-03-12 DIAGNOSIS — I214 Non-ST elevation (NSTEMI) myocardial infarction: Principal | ICD-10-CM

## 2019-03-12 DIAGNOSIS — I252 Old myocardial infarction: Secondary | ICD-10-CM

## 2019-03-12 DIAGNOSIS — Z8249 Family history of ischemic heart disease and other diseases of the circulatory system: Secondary | ICD-10-CM

## 2019-03-12 DIAGNOSIS — R519 Headache, unspecified: Secondary | ICD-10-CM | POA: Diagnosis present

## 2019-03-12 DIAGNOSIS — Z881 Allergy status to other antibiotic agents status: Secondary | ICD-10-CM

## 2019-03-12 DIAGNOSIS — K219 Gastro-esophageal reflux disease without esophagitis: Secondary | ICD-10-CM | POA: Diagnosis present

## 2019-03-12 DIAGNOSIS — Z96642 Presence of left artificial hip joint: Secondary | ICD-10-CM | POA: Diagnosis present

## 2019-03-12 DIAGNOSIS — Z7982 Long term (current) use of aspirin: Secondary | ICD-10-CM

## 2019-03-12 DIAGNOSIS — Z8042 Family history of malignant neoplasm of prostate: Secondary | ICD-10-CM

## 2019-03-12 DIAGNOSIS — R079 Chest pain, unspecified: Secondary | ICD-10-CM

## 2019-03-12 DIAGNOSIS — I5189 Other ill-defined heart diseases: Secondary | ICD-10-CM | POA: Diagnosis present

## 2019-03-12 DIAGNOSIS — Z888 Allergy status to other drugs, medicaments and biological substances status: Secondary | ICD-10-CM

## 2019-03-12 DIAGNOSIS — N400 Enlarged prostate without lower urinary tract symptoms: Secondary | ICD-10-CM | POA: Diagnosis present

## 2019-03-12 DIAGNOSIS — Z96651 Presence of right artificial knee joint: Secondary | ICD-10-CM | POA: Diagnosis present

## 2019-03-12 DIAGNOSIS — E785 Hyperlipidemia, unspecified: Secondary | ICD-10-CM | POA: Diagnosis present

## 2019-03-12 DIAGNOSIS — I119 Hypertensive heart disease without heart failure: Secondary | ICD-10-CM | POA: Diagnosis present

## 2019-03-12 DIAGNOSIS — Z833 Family history of diabetes mellitus: Secondary | ICD-10-CM

## 2019-03-12 DIAGNOSIS — J449 Chronic obstructive pulmonary disease, unspecified: Secondary | ICD-10-CM | POA: Diagnosis present

## 2019-03-12 DIAGNOSIS — F17210 Nicotine dependence, cigarettes, uncomplicated: Secondary | ICD-10-CM | POA: Diagnosis present

## 2019-03-12 DIAGNOSIS — M069 Rheumatoid arthritis, unspecified: Secondary | ICD-10-CM | POA: Diagnosis present

## 2019-03-12 DIAGNOSIS — F429 Obsessive-compulsive disorder, unspecified: Secondary | ICD-10-CM | POA: Diagnosis present

## 2019-03-12 DIAGNOSIS — Z20822 Contact with and (suspected) exposure to covid-19: Secondary | ICD-10-CM | POA: Diagnosis present

## 2019-03-12 DIAGNOSIS — I10 Essential (primary) hypertension: Secondary | ICD-10-CM

## 2019-03-12 HISTORY — DX: Unspecified osteoarthritis, unspecified site: M19.90

## 2019-03-12 HISTORY — DX: Cerebral infarction, unspecified: I63.9

## 2019-03-12 LAB — BASIC METABOLIC PANEL
Anion gap: 10 (ref 5–15)
BUN: 11 mg/dL (ref 6–20)
CO2: 26 mmol/L (ref 22–32)
Calcium: 9.2 mg/dL (ref 8.9–10.3)
Chloride: 101 mmol/L (ref 98–111)
Creatinine, Ser: 0.61 mg/dL (ref 0.61–1.24)
GFR calc Af Amer: >60 mL/min (ref >60)
GFR calc non Af Amer: >60 mL/min (ref >60)
Glucose, Bld: 98 mg/dL (ref 70–99)
Potassium: 3.7 mmol/L (ref 3.5–5.1)
Sodium: 137 mmol/L (ref 135–145)

## 2019-03-12 LAB — RESPIRATORY PANEL BY RT PCR (FLU A&B, COVID)
Influenza A by PCR: NEGATIVE
Influenza B by PCR: NEGATIVE
SARS Coronavirus 2 by RT PCR: NEGATIVE

## 2019-03-12 LAB — CBC
HCT: 42.1 % (ref 39.0–52.0)
Hemoglobin: 13.7 g/dL (ref 13.0–17.0)
MCH: 27.3 pg (ref 26.0–34.0)
MCHC: 32.5 g/dL (ref 30.0–36.0)
MCV: 83.9 fL (ref 80.0–100.0)
Platelets: 387 10*3/uL (ref 150–400)
RBC: 5.02 MIL/uL (ref 4.22–5.81)
RDW: 12.6 % (ref 11.5–15.5)
WBC: 17.6 10*3/uL — ABNORMAL HIGH (ref 4.0–10.5)
nRBC: 0 % (ref 0.0–0.2)

## 2019-03-12 LAB — APTT: aPTT: 26 seconds (ref 24–36)

## 2019-03-12 LAB — TROPONIN I (HIGH SENSITIVITY)
Troponin I (High Sensitivity): 539 ng/L (ref <18)
Troponin I (High Sensitivity): 422 ng/L (ref <18)

## 2019-03-12 LAB — PROTIME-INR
INR: 1 (ref 0.8–1.2)
Prothrombin Time: 13.2 seconds (ref 11.4–15.2)

## 2019-03-12 MED ORDER — CARVEDILOL 12.5 MG PO TABS
12.5000 mg | ORAL_TABLET | Freq: Two times a day (BID) | ORAL | Status: DC
  Administered 2019-03-13 – 2019-03-15 (×4): 12.5 mg via ORAL
  Filled 2019-03-12 (×2): qty 1

## 2019-03-12 MED ORDER — HEPARIN BOLUS VIA INFUSION
4000.0000 [IU] | Freq: Once | INTRAVENOUS | Status: AC
  Administered 2019-03-12: 4000 [IU] via INTRAVENOUS
  Filled 2019-03-12: qty 4000

## 2019-03-12 MED ORDER — ASPIRIN EC 81 MG PO TBEC
81.0000 mg | DELAYED_RELEASE_TABLET | Freq: Every day | ORAL | Status: DC
  Administered 2019-03-13: 81 mg via ORAL
  Filled 2019-03-12 (×3): qty 1

## 2019-03-12 MED ORDER — ASPIRIN 81 MG PO CHEW
324.0000 mg | CHEWABLE_TABLET | Freq: Once | ORAL | Status: AC
  Administered 2019-03-12: 324 mg via ORAL
  Filled 2019-03-12: qty 4

## 2019-03-12 MED ORDER — ASPIRIN 300 MG RE SUPP: 300.0000 mg | RECTAL | Status: AC

## 2019-03-12 MED ORDER — ACETAMINOPHEN 325 MG PO TABS
650.0000 mg | ORAL_TABLET | ORAL | Status: DC | PRN
  Administered 2019-03-14 (×2): 650 mg via ORAL
  Filled 2019-03-12: qty 2

## 2019-03-12 MED ORDER — ASPIRIN 81 MG PO CHEW: 324.0000 mg | CHEWABLE_TABLET | ORAL | Status: AC

## 2019-03-12 MED ORDER — NITROGLYCERIN 0.4 MG SL SUBL: 0.4000 mg | SUBLINGUAL_TABLET | SUBLINGUAL | Status: DC | PRN

## 2019-03-12 MED ORDER — HEPARIN (PORCINE) 25000 UT/250ML-% IV SOLN
1650.0000 [IU]/h | INTRAVENOUS | Status: DC
  Administered 2019-03-12: 1150 [IU]/h via INTRAVENOUS
  Filled 2019-03-12 (×5): qty 250

## 2019-03-12 MED ORDER — ONDANSETRON HCL 4 MG/2ML IJ SOLN: 4.0000 mg | Freq: Four times a day (QID) | INTRAMUSCULAR | Status: DC | PRN

## 2019-03-12 NOTE — ED Triage Notes (Signed)
Pt reports left sided chest pain that radiates into back and down both arm. Pt sts symptoms started on Saturday and he took 3 nitro on Saturday with no relief. Pt denise SOB, N/V.

## 2019-03-12 NOTE — Telephone Encounter (Signed)
Message from Esaw Dace sent at 03/12/2019 10:51 AM EST  Summary: Possible Medication reaction   Pt's wife stated pt almost cut fingers off this weekend. He went to the ED and they stitched him up and gave him cephalexin. 4 days later pt started having chest pain after taking cephalexin. Pt had nitroglycerin at home and took 3. He thinks antibiotics caused chest pain. Pt is still having chest pain across chest and forearms. Pt went on to work but wife stated he needs to speak with a nurse about what he should do next. Pt was not with wife during call.           Pt stated that his pain is located across his chest and down both arms to the wrists. Pt stated he has had a flare with RA after taking Cipro in the past, so he stated he assumed his pain was from the abx.  Pt stated pain is mild to moderate and feels like "aching and soreness. Pt also stated he is having pain to his neck  And his upper back across his shoulder blades.  Pt stated he also tried and medicated muscle rub ("like Ephraim Hamburger") and it helped but never went away. Pt stated the pain comes and goes and last 2 hours at a time. Pt is a smoker, borderline diabetic and has COPD and HTN. Pt sounded SOB on the phone, but pt stated that his breathing is at his baseline.  Pt described he is having lightheadedness and he also noted it when he turn to his side. He stated the lightheadedness went away shortly after. Pt stated position changes cause lightheadedness.  Care advice given. Pt advised to call 911 now. Pt agreeable. Called office and spoke with [facility] with a synopsis of triage and my disposition. Will forward high priority.  Reason for Disposition . Pain also in shoulder(s) or arm(s) or jaw  Additional Information . Commented on: Dizziness or lightheadedness    Having lightheadedness  Answer Assessment - Initial Assessment Questions 1. LOCATION: "Where does it hurt?"       Across chest, the pectoral muscles, pain to both arms  to the wrist 2. RADIATION: "Does the pain go anywhere else?" (e.g., into neck, jaw, arms, back)     Back across shoulders, neck pain Saturday 3. ONSET: "When did the chest pain begin?" (Minutes, hours or days)      03/10/19  4. PATTERN "Does the pain come and go, or has it been constant since it started?"  "Does it get worse with exertion?"      Comes and goes 5. DURATION: "How long does it last" (e.g., seconds, minutes, hours)     2 hours 6. SEVERITY: "How bad is the pain?"  (e.g., Scale 1-10; mild, moderate, or severe)    - MILD (1-3): doesn't interfere with normal activities     - MODERATE (4-7): interferes with normal activities or awakens from sleep    - SEVERE (8-10): excruciating pain, unable to do any normal activities       Today mild to moderate -aching and soreness 7. CARDIAC RISK FACTORS: "Do you have any history of heart problems or risk factors for heart disease?" (e.g., angina, prior heart attack; diabetes, high blood pressure, high cholesterol, smoker, or strong family history of heart disease)     Smoke, father died of MI, borderline diabetic, smoker 8. PULMONARY RISK FACTORS: "Do you have any history of lung disease?"  (e.g., blood clots in lung, asthma,  emphysema, birth control pills)     COPD no oxygen  9. CAUSE: "What do you think is causing the chest pain?"     Thought it was after the abx- pt took Cipro and ached form "head to toe" flared his rheumatoid arthritis 10. OTHER SYMPTOMS: "Do you have any other symptoms?" (e.g., dizziness, nausea, vomiting, sweating, fever, difficulty breathing, cough)       Both shoulders are sore but the left shoulder is swollen no redness, dizziness when pt rolled over in bed but but went away quickly, having slight dizziness with position changes. 11. PREGNANCY: "Is there any chance you are pregnant?" "When was your last menstrual period?"       n/a  Protocols used: CHEST PAIN-A-AH

## 2019-03-12 NOTE — ED Notes (Signed)
Report was given to Sonia Baller, RN in [place]. Report was given SBAR style and RN verbalized understanding of pts condition and treatment delivered in the ED. No further questions for this RN./ Pt taken upstairs to Preop at this time

## 2019-03-12 NOTE — ED Provider Notes (Addendum)
[facility] Emergency Department Provider Note       Time seen: ----------------------------------------- 7:49 PM on 03/12/2019 -----------------------------------------   I have reviewed the triage vital signs and the nursing notes.  HISTORY   Chief Complaint Chest Pain    HPI Tim Anderson is a 54 y.o. male with a history of COPD, drug abuse, hyperlipidemia, hypertension, OCD, thrombocytosis who presents to the ED for left-sided chest pain that radiates into his back and down both arms.  Patient states symptoms started Saturday and he took 3 nitro on Saturday with no relief.  He denies shortness of breath, nausea or vomiting.  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  He thinks that when he cut his hand he pulled his arm and hand back violently likely caused some a muscular chest pain.  Past Medical History:  Diagnosis Date  . Allergy   . BPH (benign prostatic hyperplasia)   . COPD (chronic obstructive pulmonary disease) ([place])   . Drug abuse ([place])   . Dysmetabolic syndrome X   . Erosive gastritis   . History of hip replacement 12/2016  . History of knee replacement 01/2015  . History of left hip replacement 12/27/2016  . Hyperlipidemia   . Hypertension   . Hypogonadism male   . Obesity   . Obsessive compulsive disorder   . OSA (obstructive sleep apnea)   . Radiculitis   . Stomatitis monilial   . Thrombocytosis ([place])     Patient Active Problem List   Diagnosis Date Noted  . 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 Atherosclerosis of aorta ([place]) 06/09/2018  . Mediastinal lymphadenopathy 06/09/2018  . Lung nodules 06/09/2018  . Arthralgia of left temporomandibular joint 12/08/2017  . Mucopurulent chronic bronchitis ([place]) 12/08/2017  . Mild left ventricular systolic dysfunction [id number]  . Grade II diastolic dysfunction [id number]  . Mild mitral regurgitation 07/05/2017  . Mild  tricuspid regurgitation 07/05/2017  . Essential hemorrhagic thrombocythemia ([place]) 05/10/2017  . History of left hip replacement 12/27/2016  . Angina pectoris ([place]) 10/27/2016  . Carpal tunnel syndrome on both sides 04/07/2016  . Primary osteoarthritis involving multiple joints 07/29/2015  . Arthritis of knee, degenerative 02/03/2015  . Dyslipidemia associated with type 2 diabetes mellitus ([place]) 01/27/2015  . Benign fibroma of prostate 09/28/2014  . Bronchitis with chronic airway obstruction ([place]) 09/28/2014  . Drug abuse ([place]) 09/28/2014  . Deflected nasal septum 09/28/2014  . Dyslipidemia 09/28/2014  . Dysmetabolic syndrome [id number]  . GERD (gastroesophageal reflux disease) 09/28/2014  . H/O renal calculi 09/28/2014  . Benign hypertension 09/28/2014  . Male hypogonadism 09/28/2014  . Failure of erection 09/28/2014  . Obstructive apnea 09/28/2014  . Depression with anxiety 09/28/2014  . Obesity (BMI 30-39.9) 09/28/2014  . Migraine without aura and responsive to treatment 09/28/2014  . Perennial allergic rhinitis with seasonal variation 09/28/2014  . Elevated platelet count 09/28/2014  . Tobacco abuse 09/28/2014  . Cerebrovascular accident (CVA) ([place]) 07/27/2013  . History of TIA (transient ischemic attack) 09/20/2011  . Rheumatoid arthritis involving multiple joints ([place]) 07/31/2009    Past Surgical History:  Procedure Laterality Date  . FACIAL RECONSTRUCTION SURGERY    . KNEE SURGERY Right     Allergies Ciprofloxacin, Crestor [rosuvastatin calcium], Droperidol, Lipofen  [fenofibrate], and Niacin er  Social History Social History   Tobacco Use  . Smoking status: Current Some Day Smoker    Packs/day: 1.00    Years: 39.00    Pack years:  39.00    Types: Cigarettes    Start date: 09/30/1976  . Smokeless tobacco: Never Used  . Tobacco comment: 1 pack with last a month-01/05/2019  Substance Use Topics  . Alcohol use: Yes    Alcohol/week: 2.0 standard drinks    Types:  2 Cans of beer per week    Comment: occasionally  . Drug use: Not Currently    Types: Marijuana    Comment: used to use cocaine    Review of Systems Constitutional: Negative for fever. Cardiovascular: Positive for chest pain Respiratory: Negative for shortness of breath. Gastrointestinal: Negative for abdominal pain, vomiting and diarrhea. Musculoskeletal: Negative for back pain. Skin: Negative for rash. Neurological: Negative for headaches, focal weakness or numbness.  All systems negative/normal/unremarkable except as stated in the HPI  ____________________________________________   PHYSICAL EXAM:  VITAL SIGNS: ED Triage Vitals [03/12/19 1923]  Enc Vitals Group     BP (!) 156/85     Pulse Rate 98     Resp 17     Temp 98.4 F (36.9 C)     Temp Source Oral     SpO2 99 %     Weight      Height      Head Circumference      Peak Flow      Pain Score      Pain Loc      Pain Edu?      Excl. in [place]?     Constitutional: Alert and oriented. Well appearing and in no distress. Eyes: Conjunctivae are normal. Normal extraocular movements. ENT      Head: Normocephalic and atraumatic.      Nose: No congestion/rhinnorhea.      Mouth/Throat: Mucous membranes are moist.      Neck: No stridor. Cardiovascular: Normal rate, regular rhythm. No murmurs, rubs, or gallops. Respiratory: Normal respiratory effort without tachypnea nor retractions. Breath sounds are clear and equal bilaterally. No wheezes/rales/rhonchi. Gastrointestinal: Soft and nontender. Normal bowel sounds Musculoskeletal: Nontender with normal range of motion in extremities. No lower extremity tenderness nor edema. Neurologic:  Normal speech and language. No gross focal neurologic deficits are appreciated.  Skin:  Skin is warm, dry and intact. No rash noted. Psychiatric: Mood and affect are normal. Speech and behavior are normal.  ____________________________________________  EKG: Interpreted by me.  Sinus rhythm  rate of 90 bpm, incomplete right bundle branch block, normal axis, normal QT  ____________________________________________  ED COURSE:  As part of my medical decision making, I reviewed the following data within the [place] History obtained from family if available, nursing notes, old chart and ekg, as well as notes from prior ED visits. Patient presented for chest pain, we will assess with labs and imaging as indicated at this time.   Procedures  Tim Anderson was evaluated in Emergency Department on 03/12/2019 for the symptoms described in the history of present illness. He was evaluated in the context of the global COVID-19 pandemic, which necessitated consideration that the patient might be at risk for infection with the SARS-CoV-2 virus that causes COVID-19. Institutional protocols and algorithms that pertain to the evaluation of patients at risk for COVID-19 are in a state of rapid change based on information released by regulatory bodies including the CDC and federal and state organizations. These policies and algorithms were followed during the patient's care in the ED.  ____________________________________________   LABS (pertinent positives/negatives)  Labs Reviewed  CBC - Abnormal; Notable for the following components:  Result Value   WBC 17.6 (*)    All other components within normal limits  TROPONIN I (HIGH SENSITIVITY) - Abnormal; Notable for the following components:   Troponin I (High Sensitivity) 539 (*)    All other components within normal limits  BASIC METABOLIC PANEL    RADIOLOGY  Chest x-ray does not reveal any acute process  ____________________________________________   DIFFERENTIAL DIAGNOSIS   Musculoskeletal pain, GERD, anxiety, PE, MI, dissection  FINAL ASSESSMENT AND PLAN  Chest pain, NSTEMI   Plan: The patient had presented for chest pain.  Patient's labs are presently revealed an elevated troponin of 539 indicating  likely NSTEMI.  He is not currently having any pain but will need a full cardiac work-up.  We gave him aspirin and I will discuss with cardiology as well as the medical team for admission.   Laurence Aly, MD    Note: This note was generated in part or whole with voice recognition software. Voice recognition is usually quite accurate but there are transcription errors that can and very often do occur. I apologize for any typographical errors that were not detected and corrected.     Earleen Newport, MD 03/12/19 Lona Kettle    Earleen Newport, MD 03/12/19 2015

## 2019-03-12 NOTE — ED Notes (Signed)
Date and time results received: 03/12/19 8:11 PM    Test: Troponin  Critical Value: 539 Name of Provider Notified: Dr. Jimmye Norman

## 2019-03-12 NOTE — Progress Notes (Signed)
ANTICOAGULATION CONSULT NOTE - Initial Consult  Pharmacy Consult for Heparin Drip Indication: chest pain/ACS  Allergies  Allergen Reactions  . Ciprofloxacin Other (See Comments)    Body aches  . Crestor [Rosuvastatin Calcium] Other (See Comments)    Muscles aches  . Droperidol Other (See Comments)    Heart stopped  . Lipofen  [Fenofibrate]     muscle aches muscle aches  . Niacin Er     flushed    Patient Measurements:   Heparin Dosing Weight: 87.78 kg  Vital Signs: Temp: 98.4 F (36.9 C) (01/25 1923) Temp Source: Oral (01/25 1923) BP: 148/95 (01/25 2048) Pulse Rate: 88 (01/25 2048)  Labs: Recent Labs    03/12/19 1928  HGB 13.7  HCT 42.1  PLT 387  CREATININE 0.61  TROPONINIHS 539*    Estimated Creatinine Clearance: 117.5 mL/min (by C-G formula based on SCr of 0.61 mg/dL).   Medical History: Past Medical History:  Diagnosis Date  . Allergy   . BPH (benign prostatic hyperplasia)   . COPD (chronic obstructive pulmonary disease) ([place])   . Drug abuse ([place])   . Dysmetabolic syndrome X   . Erosive gastritis   . History of hip replacement 12/2016  . History of knee replacement 01/2015  . History of left hip replacement 12/27/2016  . Hyperlipidemia   . Hypertension   . Hypogonadism male   . Obesity   . Obsessive compulsive disorder   . OSA (obstructive sleep apnea)   . Radiculitis   . Stomatitis monilial   . Thrombocytosis (H[facility])     Medications:  Scheduled:  . aspirin  324 mg Oral NOW   Or  . aspirin  300 mg Rectal NOW  . [START ON 03/13/2019] aspirin EC  81 mg Oral Daily  . [START ON 03/13/2019] carvedilol  12.5 mg Oral BID WC  . heparin  4,000 Units Intravenous Once   Infusions:  . heparin      Assessment: Pharmacy consulted to dose and monitor a heparin drip for this 54 yo male presenting to ED with chest pain.  Elevated troponin of 539 indicating likely NSTEMI.   Goal of Therapy:  Heparin level 0.3-0.7 units/ml Monitor platelets by  anticoagulation protocol: Yes   Plan:  Give 4000 units bolus x 1 Start heparin infusion at 1150 units/hr Check anti-Xa level in 6 hours and daily while on heparin Continue to monitor H&H and platelets  Jonanthan Bolender K, RPH 03/12/2019,9:25 PM

## 2019-03-12 NOTE — Telephone Encounter (Signed)
Tried calling patient twice to advise him to go to ER. Patient went to [facility] on 03/06/2019 for finger laceration. However, due to chest pain Dr. Ancil Boozer states Cipro is unlikely to give him chest pain and needs to go back to ER

## 2019-03-12 NOTE — ED Notes (Signed)
Pt was given a meal tray and beverage with MD Duncan's permission/ Pt will return to NPO status at [facility]

## 2019-03-12 NOTE — H&P (Signed)
History and Physical    Tim Anderson DOB: Nov 06, 1965 DOA: 03/12/2019  PCP: Steele Sizer, MD   Patient coming from: home I have personally briefly reviewed patient's old medical records in [place]  Chief Complaint: chest pain  HPI: Tim Anderson is a 54 y.o. male with medical history significant for COPD, hypertension, diastolic heart failure, rheumatoid arthritis and obstructive sleep apnea family history of MI in father, history of positive stress test in 12/2016 for preop clearance, who presents to the emergency room with a 3-day history of sided chest pain.Marland Kitchen  He describes it as a mild to moderate intensity aching pain radiating across his chest and down bilateral arms, neck and shoulder blades, no aggravating or alleviating factors.  Took nitroglycerin and did not help.  Also use muscle rubs without improvement.ED stated he believed it is related to muscle strain following a carpentry incident with a saw 4 days ago in which he sustained laceration of the left second and third fingers requiring an ER visit.  Patient was advised to come in to the ER for evaluation of chest pain by his primary care provider.  On arrival, he was chest pain-free.   He denies associated nausea vomiting, diaphoresis, palpitations. Did have brief  lightheadedness.  Denies cough, fever chills or shortness of breath.  ED Course: On arrival in the emergency room he was afebrile with blood pressure of 156/85, heart rate 99 saturating 99% on room air.  EKG showed normal sinus rhythm with no acute ST-T wave changes.  First troponin was 539 and his blood work was otherwise unremarkable.  Review of Systems: As per HPI otherwise 10 point review of systems negative.    Past Medical History:  Diagnosis Date  . Allergy   . BPH (benign prostatic hyperplasia)   . COPD (chronic obstructive pulmonary disease) ([place])   . Drug abuse ([place])   . Dysmetabolic syndrome X   . Erosive gastritis     . History of hip replacement 12/2016  . History of knee replacement 01/2015  . History of left hip replacement 12/27/2016  . Hyperlipidemia   . Hypertension   . Hypogonadism male   . Obesity   . Obsessive compulsive disorder   . OSA (obstructive sleep apnea)   . Radiculitis   . Stomatitis monilial   . Thrombocytosis ([place])     Past Surgical History:  Procedure Laterality Date  . FACIAL RECONSTRUCTION SURGERY    . KNEE SURGERY Right      reports that he has been smoking cigarettes. He started smoking about 42 years ago. He has a 39.00 pack-year smoking history. He has never used smokeless tobacco. He reports current alcohol use of about 2.0 standard drinks of alcohol per week. He reports previous drug use. Drug: Marijuana.  Allergies  Allergen Reactions  . Ciprofloxacin Other (See Comments)    Body aches  . Crestor [Rosuvastatin Calcium] Other (See Comments)    Muscles aches  . Droperidol Other (See Comments)    Heart stopped  . Lipofen  [Fenofibrate]     muscle aches muscle aches  . Niacin Er     flushed    Family History  Problem Relation Age of Onset  . Diabetes Mother   . Diabetes Father   . Heart attack Father   . Cancer Brother        Skin and Prostate-Oldest Brother  . Diabetes Brother      Prior to Admission medications   Medication Sig  Start Date End Date Taking? Authorizing Provider  amLODipine-benazepril (LOTREL) 5-20 MG capsule Take 1 capsule by mouth daily. Patient taking differently: Take 1 capsule by mouth at bedtime.  10/10/18  Yes Steele Sizer, MD  aspirin EC 81 MG tablet Take 81 mg by mouth. Reported on 07/29/2015 10/26/11  Yes [provider]  carvedilol (COREG) 12.5 MG tablet Take 1 tablet (12.5 mg total) by mouth 2 (two) times daily with a meal. 10/10/18  Yes Sowles, Drue Stager, MD  Fluticasone-Umeclidin-Vilant (TRELEGY ELLIPTA IN) Inhale 1 puff into the lungs daily.   Yes [provider]  hydroxychloroquine (PLAQUENIL) 200 MG  tablet Take 400 tablets by mouth at bedtime.  01/15/16  Yes Emmaline Kluver., MD  ketorolac (TORADOL) 10 MG tablet TAKE 1 TABLET BY MOUTH EVERY 6 HOURS AS NEEDED 01/15/19  Yes Sowles, Drue Stager, MD  omeprazole (PRILOSEC) 20 MG capsule Take 1 capsule (20 mg total) by mouth daily. 10/10/18  Yes Sowles, Drue Stager, MD  promethazine (PHENERGAN) 12.5 MG tablet Take 1 tablet (12.5 mg total) by mouth every 8 (eight) hours as needed for nausea or vomiting. 10/10/18  Yes Sowles, Drue Stager, MD  rizatriptan (MAXALT) 10 MG tablet Take 1 tablet (10 mg total) by mouth as needed for migraine. 10/10/18  Yes Sowles, Drue Stager, MD  sildenafil (REVATIO) 20 MG tablet Take 3 to 5 tablets two hours before intercouse on an empty stomach.Do not take with nitrates. 03/06/19  Yes McGowan, Larene Beach A, PA-C  tamsulosin (FLOMAX) 0.4 MG CAPS capsule Take 1 capsule (0.4 mg total) by mouth daily. 12/04/18  Yes Nori Riis, PA-C    Physical Exam: Vitals:   03/12/19 1923 03/12/19 2048  BP: (!) 156/85 (!) 148/95  Pulse: 98 88  Resp: 17 15  Temp: 98.4 F (36.9 C)   TempSrc: Oral   SpO2: 99% 99%     Vitals:   03/12/19 1923 03/12/19 2048  BP: (!) 156/85 (!) 148/95  Pulse: 98 88  Resp: 17 15  Temp: 98.4 F (36.9 C)   TempSrc: Oral   SpO2: 99% 99%    Constitutional: NAD, alert and oriented x 3 Eyes: PERRL, lids and conjunctivae normal ENMT: Mucous membranes are moist.  Neck: normal, supple, no masses, no thyromegaly Respiratory: clear to auscultation bilaterally, no wheezing, no crackles. Normal respiratory effort. No accessory muscle use.  Cardiovascular: Regular rate and rhythm, no murmurs / rubs / gallops. No extremity edema. 2+ pedal pulses. No carotid bruits.  Abdomen: no tenderness, no masses palpated. No hepatosplenomegaly. Bowel sounds positive.  Musculoskeletal: no clubbing / cyanosis. No joint deformity upper and lower extremities.  Skin: no rashes, lesions, ulcers.  Neurologic: No gross focal neurologic  deficit. Psychiatric: Normal mood and affect.   Labs on Admission: I have personally reviewed following labs and imaging studies  CBC: Recent Labs  Lab 03/12/19 1928  WBC 17.6*  HGB 13.7  HCT 42.1  MCV 83.9  PLT [id number]   Basic Metabolic Panel: Recent Labs  Lab 03/12/19 1928  NA 137  K 3.7  CL 101  CO2 26  GLUCOSE 98  BUN 11  CREATININE 0.61  CALCIUM 9.2   GFR: Estimated Creatinine Clearance: 117.5 mL/min (by C-G formula based on SCr of 0.61 mg/dL). Liver Function Tests: No results for input(s): AST, ALT, ALKPHOS, BILITOT, PROT, ALBUMIN in the last 168 hours. No results for input(s): LIPASE, AMYLASE in the last 168 hours. No results for input(s): AMMONIA in the last 168 hours. Coagulation Profile: No results for input(s): INR,  PROTIME in the last 168 hours. Cardiac Enzymes: No results for input(s): CKTOTAL, CKMB, CKMBINDEX, TROPONINI in the last 168 hours. BNP (last 3 results) No results for input(s): PROBNP in the last 8760 hours. HbA1C: No results for input(s): HGBA1C in the last 72 hours. CBG: No results for input(s): GLUCAP in the last 168 hours. Lipid Profile: No results for input(s): CHOL, HDL, LDLCALC, TRIG, CHOLHDL, LDLDIRECT in the last 72 hours. Thyroid Function Tests: No results for input(s): TSH, T4TOTAL, FREET4, T3FREE, THYROIDAB in the last 72 hours. Anemia Panel: No results for input(s): VITAMINB12, FOLATE, FERRITIN, TIBC, IRON, RETICCTPCT in the last 72 hours. Urine analysis:    Component Value Date/Time   COLORURINE YELLOW (A) 11/14/2017 1920   APPEARANCEUR Clear 11/23/2017 1612   LABSPEC 1.010 11/14/2017 1920   LABSPEC 1.010 10/23/2012 1319   PHURINE 5.0 11/14/2017 1920   GLUCOSEU Negative 11/23/2017 1612   GLUCOSEU Negative 10/23/2012 1319   HGBUR NEGATIVE 11/14/2017 1920   BILIRUBINUR Negative 11/23/2017 1612   BILIRUBINUR Negative 10/23/2012 1319   KETONESUR NEGATIVE 11/14/2017 1920   PROTEINUR Negative 11/23/2017 1612   PROTEINUR  NEGATIVE 11/14/2017 1920   UROBILINOGEN negative (A) 11/14/2017 1521   NITRITE Negative 11/23/2017 1612   NITRITE NEGATIVE 11/14/2017 1920   LEUKOCYTESUR Negative 11/23/2017 1612   LEUKOCYTESUR Negative 10/23/2012 1319    Radiological Exams on Admission: DG Chest 2 View  Result Date: 03/12/2019 CLINICAL DATA:  54 year old male with left side chest pain radiating to the back in both arms for 2 days. No improvement with nitroglycerin. EXAM: CHEST - 2 VIEW COMPARISON:  CT chest 12/21/2018 and earlier. FINDINGS: Mediastinal contours remain normal. Visualized tracheal air column is within normal limits. Stable lung volumes. Coarse bilateral increased pulmonary interstitial markings have not significantly changed since 2015. No pneumothorax, pulmonary edema, pleural effusion or acute pulmonary opacity. No acute osseous abnormality identified. Negative visible bowel gas pattern. IMPRESSION: No acute cardiopulmonary abnormality. Electronically Signed   By: Genevie Ann M.D.   On: 03/12/2019 19:44    EKG: Independently reviewed.   Assessment/Plan Principal Problem:   NSTEMI (non-ST elevated myocardial infarction) ([place]) Patient has family history of MI and cardiac risk factors First troponin, 539 with EKG showing no acute ST-T wave changes\ Continue to cycle troponins Received aspirin in the emergency room Heparin infusion, carvedilol, aspirin.  Patient allergic to statin Nitroglycerin sublingual as needed chest pain with morphine for breakthrough Cardiology consult assistance with for the riskstratification/work-up Echo in the a.m.    HTN (hypertension) Blood pressure controlled. Continue home meds    Obstructive apnea CPAP if desired. Encouraged    Rheumatoid arthritis involving multiple joints (HCC) Currently follows with rheumatology Plaquenil after med rec    Grade II diastolic dysfunction Currently euvolemic Continue beta-blocker and ACE inhibitor  COPD Not acutely exacerbation As  needed bronchodilators      DVT prophylaxis: on full dose heparini  Code Status: full code  Family Communication: none  Disposition Plan: Back to previous home environment Consults called: Dr. Ubaldo Glassing, cardiology     Athena Masse MD [facility]     03/12/2019, 8:52 PM

## 2019-03-13 ENCOUNTER — Other Ambulatory Visit: Payer: Self-pay

## 2019-03-13 ENCOUNTER — Encounter: Payer: Self-pay | Admitting: Internal Medicine

## 2019-03-13 ENCOUNTER — Inpatient Hospital Stay
Admit: 2019-03-13 | Discharge: 2019-03-13 | Disposition: A | Discharge status: Home / Self Care | Payer: Self-pay | Attending: Internal Medicine | Admitting: Internal Medicine

## 2019-03-13 DIAGNOSIS — G4733 Obstructive sleep apnea (adult) (pediatric): Secondary | ICD-10-CM

## 2019-03-13 DIAGNOSIS — I10 Essential (primary) hypertension: Secondary | ICD-10-CM

## 2019-03-13 DIAGNOSIS — J449 Chronic obstructive pulmonary disease, unspecified: Secondary | ICD-10-CM

## 2019-03-13 DIAGNOSIS — N400 Enlarged prostate without lower urinary tract symptoms: Secondary | ICD-10-CM

## 2019-03-13 DIAGNOSIS — I214 Non-ST elevation (NSTEMI) myocardial infarction: Secondary | ICD-10-CM

## 2019-03-13 LAB — HEPARIN LEVEL (UNFRACTIONATED)
Heparin Unfractionated: 0.1 IU/mL — ABNORMAL LOW (ref 0.30–0.70)
Heparin Unfractionated: 0.16 IU/mL — ABNORMAL LOW (ref 0.30–0.70)
Heparin Unfractionated: 0.3 IU/mL (ref 0.30–0.70)

## 2019-03-13 LAB — BASIC METABOLIC PANEL
Anion gap: 10 (ref 5–15)
BUN: 14 mg/dL (ref 6–20)
CO2: 26 mmol/L (ref 22–32)
Calcium: 8.8 mg/dL — ABNORMAL LOW (ref 8.9–10.3)
Chloride: 102 mmol/L (ref 98–111)
Creatinine, Ser: 0.59 mg/dL — ABNORMAL LOW (ref 0.61–1.24)
GFR calc Af Amer: >60 mL/min (ref >60)
GFR calc non Af Amer: >60 mL/min (ref >60)
Glucose, Bld: 113 mg/dL — ABNORMAL HIGH (ref 70–99)
Potassium: 3.4 mmol/L — ABNORMAL LOW (ref 3.5–5.1)
Sodium: 138 mmol/L (ref 135–145)

## 2019-03-13 LAB — LIPID PANEL
Cholesterol: 164 mg/dL (ref 0–200)
HDL: 26 mg/dL — ABNORMAL LOW (ref >40)
LDL Cholesterol: 88 mg/dL (ref 0–99)
Total CHOL/HDL Ratio: 6.3 RATIO
Triglycerides: 248 mg/dL — ABNORMAL HIGH (ref <150)
VLDL: 50 mg/dL — ABNORMAL HIGH (ref 0–40)

## 2019-03-13 LAB — CBC
HCT: 39.3 % (ref 39.0–52.0)
Hemoglobin: 12.8 g/dL — ABNORMAL LOW (ref 13.0–17.0)
MCH: 27.1 pg (ref 26.0–34.0)
MCHC: 32.6 g/dL (ref 30.0–36.0)
MCV: 83.1 fL (ref 80.0–100.0)
Platelets: 365 10*3/uL (ref 150–400)
RBC: 4.73 MIL/uL (ref 4.22–5.81)
RDW: 12.7 % (ref 11.5–15.5)
WBC: 13.3 10*3/uL — ABNORMAL HIGH (ref 4.0–10.5)
nRBC: 0 % (ref 0.0–0.2)

## 2019-03-13 LAB — ECHOCARDIOGRAM COMPLETE
Height: 67 in
Weight: 3509.72 oz

## 2019-03-13 LAB — HIV ANTIBODY (ROUTINE TESTING W REFLEX): HIV Screen 4th Generation wRfx: NONREACTIVE

## 2019-03-13 MED ORDER — FLUTICASONE-UMECLIDIN-VILANT 100-62.5-25 MCG/INH IN AEPB: 1.0000 | INHALATION_SPRAY | Freq: Every day | RESPIRATORY_TRACT | Status: DC

## 2019-03-13 MED ORDER — PANTOPRAZOLE SODIUM 40 MG PO TBEC
40.0000 mg | DELAYED_RELEASE_TABLET | Freq: Every day | ORAL | Status: DC
  Administered 2019-03-14 – 2019-03-15 (×2): 40 mg via ORAL
  Filled 2019-03-13 (×3): qty 1

## 2019-03-13 MED ORDER — UMECLIDINIUM BROMIDE 62.5 MCG/INH IN AEPB
1.0000 | INHALATION_SPRAY | Freq: Every day | RESPIRATORY_TRACT | Status: DC
  Administered 2019-03-13 – 2019-03-15 (×3): 1 via RESPIRATORY_TRACT
  Filled 2019-03-13: qty 7

## 2019-03-13 MED ORDER — HEPARIN BOLUS VIA INFUSION
2600.0000 [IU] | Freq: Once | INTRAVENOUS | Status: AC
  Administered 2019-03-13: 2600 [IU] via INTRAVENOUS
  Filled 2019-03-13: qty 2600

## 2019-03-13 MED ORDER — HYDROXYCHLOROQUINE SULFATE 200 MG PO TABS
200.0000 mg | ORAL_TABLET | Freq: Every day | ORAL | Status: DC
  Administered 2019-03-13 – 2019-03-14 (×2): 200 mg via ORAL
  Filled 2019-03-13 (×3): qty 1

## 2019-03-13 MED ORDER — UMECLIDINIUM-VILANTEROL 62.5-25 MCG/INH IN AEPB
1.0000 | INHALATION_SPRAY | Freq: Every day | RESPIRATORY_TRACT | Status: DC
  Filled 2019-03-13: qty 14

## 2019-03-13 MED ORDER — UMECLIDINIUM-VILANTEROL 62.5-25 MCG/INH IN AEPB: 1.0000 | INHALATION_SPRAY | Freq: Every day | RESPIRATORY_TRACT | Status: DC

## 2019-03-13 MED ORDER — BUTALBITAL-APAP-CAFFEINE 50-325-40 MG PO TABS
1.0000 | ORAL_TABLET | Freq: Once | ORAL | Status: AC
  Administered 2019-03-13: 1 via ORAL
  Filled 2019-03-13: qty 1

## 2019-03-13 MED ORDER — CARVEDILOL 12.5 MG PO TABS
ORAL_TABLET | ORAL | Status: AC
  Filled 2019-03-13: qty 1

## 2019-03-13 MED ORDER — PERFLUTREN LIPID MICROSPHERE
1.0000 mL | INTRAVENOUS | Status: AC | PRN
  Administered 2019-03-13: 12:00:00 3 mL via INTRAVENOUS
  Filled 2019-03-13: qty 10

## 2019-03-13 MED ORDER — FLUTICASONE FUROATE-VILANTEROL 100-25 MCG/INH IN AEPB
1.0000 | INHALATION_SPRAY | Freq: Every day | RESPIRATORY_TRACT | Status: DC
  Administered 2019-03-13 – 2019-03-15 (×3): 1 via RESPIRATORY_TRACT
  Filled 2019-03-13: qty 28

## 2019-03-13 MED ORDER — TAMSULOSIN HCL 0.4 MG PO CAPS
0.4000 mg | ORAL_CAPSULE | Freq: Every day | ORAL | Status: DC
  Administered 2019-03-13 – 2019-03-14 (×2): 0.4 mg via ORAL
  Filled 2019-03-13 (×3): qty 1

## 2019-03-13 MED ORDER — SODIUM CHLORIDE 0.9% FLUSH
3.0000 mL | Freq: Two times a day (BID) | INTRAVENOUS | Status: DC
  Administered 2019-03-13: 3 mL via INTRAVENOUS

## 2019-03-13 MED ORDER — CARVEDILOL 12.5 MG PO TABS
ORAL_TABLET | ORAL | Status: AC
  Administered 2019-03-13: 12.5 mg via ORAL
  Filled 2019-03-13: qty 1

## 2019-03-13 NOTE — Progress Notes (Signed)
[place] for Heparin Drip Indication: chest pain/ACS  Allergies  Allergen Reactions  . Ciprofloxacin Other (See Comments)    Body aches  . Crestor [Rosuvastatin Calcium] Other (See Comments)    Muscles aches  . Droperidol Other (See Comments)    Heart stopped  . Lipofen  [Fenofibrate]     muscle aches muscle aches  . Niacin Er     flushed    Patient Measurements: Height: 5\' 7"  (170.2 cm) Weight: 219 lb 5.7 oz (99.5 kg) IBW/kg (Calculated) : 66.1 Heparin Dosing Weight: 87.78 kg  Vital Signs: Temp: 97.8 F (36.6 C) (01/26 1600) Temp Source: Temporal (01/26 0730) BP: 156/84 (01/26 1600) Pulse Rate: 73 (01/26 1600)  Labs: Recent Labs    03/12/19 1928 03/12/19 2207 03/13/19 0210 03/13/19 1017 03/13/19 1821  HGB 13.7  --  12.8*  --   --   HCT 42.1  --  39.3  --   --   PLT 387  --  365  --   --   APTT  --  26  --   --   --   LABPROT  --  13.2  --   --   --   INR  --  1.0  --   --   --   HEPARINUNFRC  --   --  0.10* 0.16* 0.30  CREATININE 0.61  --  0.59*  --   --   TROPONINIHS 539* 422*  --   --   --     Estimated Creatinine Clearance: 117.3 mL/min (A) (by C-G formula based on SCr of 0.59 mg/dL (L)).   Medical History: Past Medical History:  Diagnosis Date  . Allergy   . Arthritis   . BPH (benign prostatic hyperplasia)   . COPD (chronic obstructive pulmonary disease) ([place])   . Drug abuse ([place])   . Dysmetabolic syndrome X   . Erosive gastritis   . History of hip replacement 12/2016  . History of knee replacement 01/2015  . History of left hip replacement 12/27/2016  . Hyperlipidemia   . Hypertension   . Hypogonadism male   . Obesity   . Obsessive compulsive disorder   . OSA (obstructive sleep apnea)   . Radiculitis   . Stomatitis monilial   . Stroke ([place])   . Thrombocytosis (HCC)     Medications:  Scheduled:  . aspirin  324 mg Oral NOW   Or  . aspirin  300 mg Rectal NOW  . aspirin EC  81 mg Oral Daily   . carvedilol  12.5 mg Oral BID WC  . fluticasone furoate-vilanterol  1 puff Inhalation Daily   And  . umeclidinium bromide  1 puff Inhalation Daily  . hydroxychloroquine  200 mg Oral QHS  . [START ON 03/14/2019] pantoprazole  40 mg Oral Daily  . sodium chloride flush  3 mL Intravenous Q12H  . tamsulosin  0.4 mg Oral QPC supper   Infusions:  . heparin 1,550 Units/hr (03/13/19 1136)    Assessment: Pharmacy consulted to dose and monitor a heparin drip for this 54 yo male presenting to ED with chest pain.  Elevated troponin of 539 indicating likely NSTEMI.   0126 02100 HL 0.10 subtherapeutic. Will rebolus w/ heparin 2600 units IV and increase 1300 units/hr and will recheck HL @ 1000, 1/26 1017 HL 0.16 rebolus w/ heparin 2600 units IV and increase 1550 units/hr 1/26 1821 HL 0.3   Goal of Therapy:  Heparin level 0.3-0.7  units/ml Monitor platelets by anticoagulation protocol: Yes   Plan:  Heparin level therapeutic. Will continue current rate of 1550 units/hr. Check HL in 6 hours. CBC with AM labs. Will continue to monitor.  Eleonore Chiquito PharmD Clinical Pharmacist 03/13/2019

## 2019-03-13 NOTE — Progress Notes (Signed)
*  PRELIMINARY RESULTS* Echocardiogram 2D Echocardiogram has been performed.  Tim Anderson [name] 03/13/2019, 12:16 PM

## 2019-03-13 NOTE — Progress Notes (Signed)
[place] for Heparin Drip Indication: chest pain/ACS  Allergies  Allergen Reactions  . Ciprofloxacin Other (See Comments)    Body aches  . Crestor [Rosuvastatin Calcium] Other (See Comments)    Muscles aches  . Droperidol Other (See Comments)    Heart stopped  . Lipofen  [Fenofibrate]     muscle aches muscle aches  . Niacin Er     flushed    Patient Measurements: Height: 5\' 7"  (170.2 cm) Weight: 219 lb 5.7 oz (99.5 kg) IBW/kg (Calculated) : 66.1 Heparin Dosing Weight: 87.78 kg  Vital Signs: Temp: 98.2 F (36.8 C) (01/26 0730) Temp Source: Temporal (01/26 0730) BP: 148/91 (01/26 0730) Pulse Rate: 90 (01/26 0730)  Labs: Recent Labs    03/12/19 1928 03/12/19 2207 03/13/19 0210 03/13/19 1017  HGB 13.7  --  12.8*  --   HCT 42.1  --  39.3  --   PLT 387  --  365  --   APTT  --  26  --   --   LABPROT  --  13.2  --   --   INR  --  1.0  --   --   HEPARINUNFRC  --   --  0.10* 0.16*  CREATININE 0.61  --  0.59*  --   TROPONINIHS 539* 422*  --   --     Estimated Creatinine Clearance: 117.3 mL/min (A) (by C-G formula based on SCr of 0.59 mg/dL (L)).   Medical History: Past Medical History:  Diagnosis Date  . Allergy   . Arthritis   . BPH (benign prostatic hyperplasia)   . COPD (chronic obstructive pulmonary disease) ([place])   . Drug abuse ([place])   . Dysmetabolic syndrome X   . Erosive gastritis   . History of hip replacement 12/2016  . History of knee replacement 01/2015  . History of left hip replacement 12/27/2016  . Hyperlipidemia   . Hypertension   . Hypogonadism male   . Obesity   . Obsessive compulsive disorder   . OSA (obstructive sleep apnea)   . Radiculitis   . Stomatitis monilial   . Stroke ([place])   . Thrombocytosis (H[facility])     Medications:  Scheduled:  . aspirin  324 mg Oral NOW   Or  . aspirin  300 mg Rectal NOW  . aspirin EC  81 mg Oral Daily  . carvedilol  12.5 mg Oral BID WC  . heparin  2,600 Units  Intravenous Once  . sodium chloride flush  3 mL Intravenous Q12H   Infusions:  . heparin 1,300 Units/hr (03/13/19 0409)    Assessment: Pharmacy consulted to dose and monitor a heparin drip for this 54 yo male presenting to ED with chest pain.  Elevated troponin of 539 indicating likely NSTEMI.   0126 @ 0500 HL 0.10 subtherapeutic. Will rebolus w/ heparin 2600 units IV and increase 1300 units/hr and will recheck HL @ 1000,  Goal of Therapy:  Heparin level 0.3-0.7 units/ml Monitor platelets by anticoagulation protocol: Yes   Plan:  0126 @ 1017 HL 0.16 subtherapeutic. Will rebolus w/ heparin 2600 units IV and increase 1550 units/hr and will recheck HL in 6 hours, will continue to monitor.  Chinita Greenland PharmD Clinical Pharmacist 03/13/2019

## 2019-03-13 NOTE — Progress Notes (Signed)
Patient ID: Tim Anderson, male   DOB: [birth year], 54 y.o.   MRN: [id number] [facility] PROGRESS NOTE  Tim Anderson [id number] DOB: 1965/09/26 DOA: 03/12/2019 PCP: Steele Sizer, MD  HPI/Subjective: Patient seen this morning.  States he had chest pain on and off on Saturday.  He again had chest pain on Sunday night where his arms were aching.  He called his cardiologist and [facility] and they told him to get to the hospital.  In the ER his troponins were found to be borderline.  No current chest pain.  Objective: Vitals:   03/13/19 0730 03/13/19 1128  BP: (!) 148/91 (!) 142/88  Pulse: 90   Resp: 17   Temp: 98.2 F (36.8 C) 97.7 F (36.5 C)  SpO2: 97%     Intake/Output Summary (Last 24 hours) at 03/13/2019 1253 Last data filed at 03/13/2019 1229 Gross per 24 hour  Intake 128 ml  Output 1175 ml  Net -1047 ml   Filed Weights   03/12/19 2355  Weight: 99.5 kg    ROS: Review of Systems  Constitutional: Negative for chills and fever.  Eyes: Negative for blurred vision.  Respiratory: Negative for cough and shortness of breath.   Cardiovascular: Negative for chest pain.  Gastrointestinal: Negative for abdominal pain, constipation, diarrhea, nausea and vomiting.  Genitourinary: Negative for dysuria.  Musculoskeletal: Negative for joint pain.  Neurological: Positive for headaches. Negative for dizziness.   Exam: Physical Exam  Constitutional: He is oriented to person, place, and time.  HENT:  Nose: No mucosal edema.  Mouth/Throat: No oropharyngeal exudate or posterior oropharyngeal edema.  Eyes: Conjunctivae and lids are normal.  Neck: Carotid bruit is not present.  Cardiovascular: S1 normal and S2 normal. Exam reveals no gallop.  No murmur heard. Respiratory: No respiratory distress. He has no wheezes. He has no rhonchi. He has no rales.  GI: Soft. Bowel sounds are normal. There is no abdominal tenderness.  Musculoskeletal:     Right ankle: No swelling.      Left ankle: No swelling.  Lymphadenopathy:    He has no cervical adenopathy.  Neurological: He is alert and oriented to person, place, and time. No cranial nerve deficit.  Skin: Skin is warm. No rash noted. Nails show no clubbing.  Psychiatric: He has a normal mood and affect.      Data Reviewed: Basic Metabolic Panel: Recent Labs  Lab 03/12/19 1928 03/13/19 0210  NA 137 138  K 3.7 3.4*  CL 101 102  CO2 26 26  GLUCOSE 98 113*  BUN 11 14  CREATININE 0.61 0.59*  CALCIUM 9.2 8.8*   CBC: Recent Labs  Lab 03/12/19 1928 03/13/19 0210  WBC 17.6* 13.3*  HGB 13.7 12.8*  HCT 42.1 39.3  MCV 83.9 83.1  PLT 387 365     Recent Results (from the past 240 hour(s))  Respiratory Panel by RT PCR (Flu A&B, Covid) - Nasopharyngeal Swab     Status: None   Collection Time: 03/12/19 10:07 PM   Specimen: Nasopharyngeal Swab  Result Value Ref Range Status   SARS Coronavirus 2 by RT PCR NEGATIVE NEGATIVE Final    Comment: (NOTE) SARS-CoV-2 target nucleic acids are NOT DETECTED. The SARS-CoV-2 RNA is generally detectable in upper respiratoy specimens during the acute phase of infection. The lowest concentration of SARS-CoV-2 viral copies this assay can detect is 131 copies/mL. A negative result does not preclude SARS-Cov-2 infection and should not be used as the sole basis for treatment or other  patient management decisions. A negative result may occur with  improper specimen collection/handling, submission of specimen other than nasopharyngeal swab, presence of viral mutation(s) within the areas targeted by this assay, and inadequate number of viral copies (<131 copies/mL). A negative result must be combined with clinical observations, patient history, and epidemiological information. The expected result is Negative. Fact Sheet for Patients:  PinkCheek.be Fact Sheet for Healthcare Providers:  GravelBags.it This test is  not yet ap proved or cleared by the [place] FDA and  has been authorized for detection and/or diagnosis of SARS-CoV-2 by FDA under an Emergency Use Authorization (EUA). This EUA will remain  in effect (meaning this test can be used) for the duration of the COVID-19 declaration under Section 564(b)(1) of the Act, 21 U.S.C. section 360bbb-3(b)(1), unless the authorization is terminated or revoked sooner.    Influenza A by PCR NEGATIVE NEGATIVE Final   Influenza B by PCR NEGATIVE NEGATIVE Final    Comment: (NOTE) The Xpert Xpress SARS-CoV-2/FLU/RSV assay is intended as an aid in  the diagnosis of influenza from Nasopharyngeal swab specimens and  should not be used as a sole basis for treatment. Nasal washings and  aspirates are unacceptable for Xpert Xpress SARS-CoV-2/FLU/RSV  testing. Fact Sheet for Patients: PinkCheek.be Fact Sheet for Healthcare Providers: GravelBags.it This test is not yet approved or cleared by the [place] FDA and  has been authorized for detection and/or diagnosis of SARS-CoV-2 by  FDA under an Emergency Use Authorization (EUA). This EUA will remain  in effect (meaning this test can be used) for the duration of the  Covid-19 declaration under Section 564(b)(1) of the Act, 21  U.S.C. section 360bbb-3(b)(1), unless the authorization is  terminated or revoked. Performed at [facility], [address] [place], [place] [address]      Studies: DG Chest 2 View  Result Date: 03/12/2019 CLINICAL DATA:  54 year old male with left side chest pain radiating to the back in both arms for 2 days. No improvement with nitroglycerin. EXAM: CHEST - 2 VIEW COMPARISON:  CT chest 12/21/2018 and earlier. FINDINGS: Mediastinal contours remain normal. Visualized tracheal air column is within normal limits. Stable lung volumes. Coarse bilateral increased pulmonary interstitial markings have not significantly  changed since 2015. No pneumothorax, pulmonary edema, pleural effusion or acute pulmonary opacity. No acute osseous abnormality identified. Negative visible bowel gas pattern. IMPRESSION: No acute cardiopulmonary abnormality. Electronically Signed   By: Genevie Ann M.D.   On: 03/12/2019 19:44   ECHOCARDIOGRAM COMPLETE  Result Date: 03/13/2019   ECHOCARDIOGRAM REPORT   Patient Name:   Tim Anderson Date of Exam: 03/13/2019 Medical Rec #:  [id number]            Height:       67.0 in Accession #:    [id number]           Weight:       219.4 lb Date of Birth:  12-Jan-1966             BSA:          2.10 m Patient Age:    47 years             BP:           148/91 mmHg Patient Gender: M                    HR:           73 bpm. Exam Location:  [facility] Procedure: 2D Echo, Color Doppler, Cardiac Doppler and Intracardiac            Opacification Agent Indications:     R07.9 Chest Pain  History:         Patient has no prior history of Echocardiogram examinations.                  Stroke and COPD; Risk Factors:Hypertension and Dyslipidemia.  Sonographer:     Charmayne Sheer RDCS (AE) Referring Phys:  [id number] Athena Masse Diagnosing Phys: Serafina Royals MD IMPRESSIONS  1. Left ventricular ejection fraction, by visual estimation, is 55 to 60%. The left ventricle has normal function. There is no left ventricular hypertrophy.  2. Definity contrast agent was given IV to delineate the left ventricular endocardial borders.  3. The left ventricle has no regional wall motion abnormalities.  4. Global right ventricle has normal systolic function.The right ventricular size is normal. No increase in right ventricular wall thickness.  5. Left atrial size was normal.  6. Right atrial size was normal.  7. The mitral valve is normal in structure. Trivial mitral valve regurgitation.  8. The tricuspid valve is normal in structure.  9. The tricuspid valve is normal in structure. Tricuspid valve regurgitation is trivial. 10. The aortic valve is normal  in structure. Aortic valve regurgitation is trivial. 11. The pulmonic valve was normal in structure. Pulmonic valve regurgitation is not visualized. FINDINGS  Left Ventricle: Left ventricular ejection fraction, by visual estimation, is 55 to 60%. The left ventricle has normal function. Definity contrast agent was given IV to delineate the left ventricular endocardial borders. The left ventricle has no regional wall motion abnormalities. There is no left ventricular hypertrophy. Right Ventricle: The right ventricular size is normal. No increase in right ventricular wall thickness. Global RV systolic function is has normal systolic function. Left Atrium: Left atrial size was normal in size. Right Atrium: Right atrial size was normal in size Pericardium: There is no evidence of pericardial effusion. Mitral Valve: The mitral valve is normal in structure. Trivial mitral valve regurgitation. MV peak gradient, 4.2 mmHg. Tricuspid Valve: The tricuspid valve is normal in structure. Tricuspid valve regurgitation is trivial. Aortic Valve: The aortic valve is normal in structure. Aortic valve regurgitation is trivial. Aortic valve mean gradient measures 3.0 mmHg. Aortic valve peak gradient measures 5.4 mmHg. Aortic valve area, by VTI measures 4.03 cm. Pulmonic Valve: The pulmonic valve was normal in structure. Pulmonic valve regurgitation is not visualized. Pulmonic regurgitation is not visualized. Aorta: The aortic root, ascending aorta and aortic arch are all structurally normal, with no evidence of dilitation or obstruction. IAS/Shunts: No atrial level shunt detected by color flow Doppler.  LEFT VENTRICLE PLAX 2D LVIDd:         4.72 cm       Diastology LVIDs:         3.73 cm       LV e' lateral:   8.59 cm/s LV PW:         0.93 cm       LV E/e' lateral: 7.7 LV IVS:        0.74 cm       LV e' medial:    7.83 cm/s LVOT diam:     2.40 cm       LV E/e' medial:  8.5 LV SV:         44 ml LV SV Index:   20.11 LVOT Area:  4.52  cm  LV Volumes (MOD) LV area d, A2C:    39.20 cm LV area d, A4C:    31.90 cm LV area s, A2C:    22.80 cm LV area s, A4C:    22.00 cm LV major d, A2C:   9.48 cm LV major d, A4C:   8.44 cm LV major s, A2C:   7.81 cm LV major s, A4C:   7.32 cm LV vol d, MOD A2C: 136.0 ml LV vol d, MOD A4C: 99.0 ml LV vol s, MOD A2C: 56.6 ml LV vol s, MOD A4C: 55.5 ml LV SV MOD A2C:     79.4 ml LV SV MOD A4C:     99.0 ml LV SV MOD BP:      65.0 ml RIGHT VENTRICLE RV Basal diam:  2.96 cm LEFT ATRIUM             Index       RIGHT ATRIUM           Index LA diam:        3.20 cm 1.52 cm/m  RA Area:     14.90 cm LA Vol (A2C):   42.9 ml 20.40 ml/m RA Volume:   35.20 ml  16.74 ml/m LA Vol (A4C):   21.6 ml 10.27 ml/m LA Biplane Vol: 31.2 ml 14.83 ml/m  AORTIC VALVE                   PULMONIC VALVE AV Area (Vmax):    4.02 cm    PV Vmax:       0.96 m/s AV Area (Vmean):   3.31 cm    PV Vmean:      68.000 cm/s AV Area (VTI):     4.03 cm    PV VTI:        0.186 m AV Vmax:           116.00 cm/s PV Peak grad:  3.7 mmHg AV Vmean:          88.500 cm/s PV Mean grad:  2.0 mmHg AV VTI:            0.220 m AV Peak Grad:      5.4 mmHg AV Mean Grad:      3.0 mmHg LVOT Vmax:         103.00 cm/s LVOT Vmean:        64.800 cm/s LVOT VTI:          0.196 m LVOT/AV VTI ratio: 0.89  AORTA Ao Root diam: 3.20 cm MITRAL VALVE MV Area (PHT): 3.77 cm             SHUNTS MV Peak grad:  4.2 mmHg             Systemic VTI:  0.20 m MV Mean grad:  2.0 mmHg             Systemic Diam: 2.40 cm MV Vmax:       1.03 m/s MV Vmean:      60.6 cm/s MV VTI:        0.24 m MV PHT:        58.29 msec MV Decel Time: 201 msec MV E velocity: 66.50 cm/s 103 cm/s MV A velocity: 88.80 cm/s 70.3 cm/s MV E/A ratio:  0.75       1.5  Serafina Royals MD Electronically signed by Serafina Royals MD Signature Date/Time: 03/13/2019/12:25:43 PM    Final     Scheduled Meds: .  aspirin  324 mg Oral NOW   Or  . aspirin  300 mg Rectal NOW  . aspirin EC  81 mg Oral Daily  . carvedilol  12.5 mg  Oral BID WC  . sodium chloride flush  3 mL Intravenous Q12H   Continuous Infusions: . heparin 1,550 Units/hr (03/13/19 1136)    Assessment/Plan:  1. Chest pain and elevated troponin concerning for NSTEMI. On aspirin, heparin drip and Coreg.  Seen by cardiology and they plan on doing a cardiac catheterization today.  Echocardiogram showed a normal ejection fraction. 2. Essential hypertension on Coreg.  Hold Lotrel. 3. COPD respiratory status stable.  Order trelogy inhaler. 4. Headache.  Patient usually drinks coffee.  We will give a dose of Fioricet. 5. Sleep apnea on CPAP 6. BPH on Flomax 7. Rheumatoid arthritis on Plaquenil  Code Status:     Code Status Orders  (From admission, onward)         Start     Ordered   03/12/19 2049  Full code  Continuous     03/12/19 2050        Code Status History    This patient has a current code status but no historical code status.   Advance Care Planning Activity     Family Communication: Gave wife and update on the phone Disposition Plan: Depending on results of cardiac catheterization  Consultants:  Cardiology  Time spent: 28 minutes  [place]

## 2019-03-13 NOTE — Progress Notes (Signed)
Pt alert and oriented, VSS. Pt on room air while awake, 2L [place] while sleeping. Discussed need for CPAP. Pt verbalized understanding. Encouraged pt to try CPAP again tonight.  Pt up to [facility] independently. Bowel movement today. Cardiology consult and echo today. Consent signed for cardiac catheterization scheduled tomorrow. Diet advanced today; will be NPO at midnight. Heparin infusing. IV site CDI; second IV started for cath procedure. Pt states he is a smoker. Discussed nicotine patch but pt states they irritate his skin and cause a rash.  Wife at bedside during visiting hours.  No complaints of pain at this time. Will continue to assess.

## 2019-03-13 NOTE — Progress Notes (Signed)
Pt placed on CPAP per order. Rufina Falco, NP had spoken with pt about importance of using CPAP prior to it being applied. After just a few minutes using CPAP he removed it and states he is not using it. Discussed again with pt importance of complying with use of CPAP. He continues to refuse. O2 via [place] applied per order. Notified Rufina Falco, NP.

## 2019-03-13 NOTE — Consult Note (Signed)
[facility] Consultation Note  Patient ID: Tim Anderson, MRN: [id number], DOB/AGE: 1965-05-28 54 y.o. Admit date: 03/12/2019   Date of Consult: 03/13/2019 Primary Physician: Steele Sizer, MD Primary Cardiologist: None  Chief Complaint:  Chief Complaint  Patient presents with  . Chest Pain   Reason for Consult: Myocardial infarction with chest pain  HPI: 54 y.o. male with known tobacco abuse hypertension hyperlipidemia and chronic obstructive pulmonary disease having episode of chest discomfort waxing and waning severe on Saturday and slightly improved but continuing to have some shortness of breath over the last 2 days.  The patient was seen in the emergency room where he had an EKG showing normal sinus rhythm.  Troponin was 529 and decreasing consistent with a non-ST elevation myocardial infarction earlier in the weekend.  The patient now is now having no evidence of further chest discomfort and improved hemodynamically.  He does have appropriate medication management for further risk reduction or worsening myocardial infarction.  Past Medical History:  Diagnosis Date  . Allergy   . Arthritis   . BPH (benign prostatic hyperplasia)   . COPD (chronic obstructive pulmonary disease) ([place])   . Drug abuse ([place])   . Dysmetabolic syndrome X   . Erosive gastritis   . History of hip replacement 12/2016  . History of knee replacement 01/2015  . History of left hip replacement 12/27/2016  . Hyperlipidemia   . Hypertension   . Hypogonadism male   . Obesity   . Obsessive compulsive disorder   . OSA (obstructive sleep apnea)   . Radiculitis   . Stomatitis monilial   . Stroke ([place])   . Thrombocytosis ([place])       Surgical History:  Past Surgical History:  Procedure Laterality Date  . FACIAL RECONSTRUCTION SURGERY    . JOINT REPLACEMENT Left    hip  . JOINT REPLACEMENT Right    knee  . KNEE SURGERY Right      Home Meds: Prior to Admission medications    Medication Sig Start Date End Date Taking? Authorizing Provider  amLODipine-benazepril (LOTREL) 5-20 MG capsule Take 1 capsule by mouth daily. Patient taking differently: Take 1 capsule by mouth at bedtime.  10/10/18  Yes Steele Sizer, MD  aspirin EC 81 MG tablet Take 81 mg by mouth. Reported on 07/29/2015 10/26/11  Yes [provider]  carvedilol (COREG) 12.5 MG tablet Take 1 tablet (12.5 mg total) by mouth 2 (two) times daily with a meal. 10/10/18  Yes Sowles, Drue Stager, MD  Fluticasone-Umeclidin-Vilant (TRELEGY ELLIPTA IN) Inhale 1 puff into the lungs daily.   Yes [provider]  hydroxychloroquine (PLAQUENIL) 200 MG tablet Take 400 tablets by mouth at bedtime.  01/15/16  Yes Emmaline Kluver., MD  ketorolac (TORADOL) 10 MG tablet TAKE 1 TABLET BY MOUTH EVERY 6 HOURS AS NEEDED 01/15/19  Yes Sowles, Drue Stager, MD  omeprazole (PRILOSEC) 20 MG capsule Take 1 capsule (20 mg total) by mouth daily. 10/10/18  Yes Sowles, Drue Stager, MD  promethazine (PHENERGAN) 12.5 MG tablet Take 1 tablet (12.5 mg total) by mouth every 8 (eight) hours as needed for nausea or vomiting. 10/10/18  Yes Sowles, Drue Stager, MD  rizatriptan (MAXALT) 10 MG tablet Take 1 tablet (10 mg total) by mouth as needed for migraine. 10/10/18  Yes Sowles, Drue Stager, MD  sildenafil (REVATIO) 20 MG tablet Take 3 to 5 tablets two hours before intercouse on an empty stomach.Do not take with nitrates. 03/06/19  Yes McGowan, Larene Beach A, PA-C  tamsulosin (  FLOMAX) 0.4 MG CAPS capsule Take 1 capsule (0.4 mg total) by mouth daily. 12/04/18  Yes McGowan, Hunt Oris, PA-C    Inpatient Medications:  . aspirin  324 mg Oral NOW   Or  . aspirin  300 mg Rectal NOW  . aspirin EC  81 mg Oral Daily  . carvedilol      . carvedilol  12.5 mg Oral BID WC  . sodium chloride flush  3 mL Intravenous Q12H   . heparin 1,300 Units/hr (03/13/19 0409)    Allergies:  Allergies  Allergen Reactions  . Ciprofloxacin Other (See Comments)    Body aches   . Crestor [Rosuvastatin Calcium] Other (See Comments)    Muscles aches  . Droperidol Other (See Comments)    Heart stopped  . Lipofen  [Fenofibrate]     muscle aches muscle aches  . Niacin Er     flushed    Social History   Socioeconomic History  . Marital status: Married    Spouse name: Lattie Haw   . Number of children: 3  . Years of education: Not on file  . Highest education level: 12th grade  Occupational History  . Occupation: Architect     Comment: self employed   Tobacco Use  . Smoking status: Current Some Day Smoker    Packs/day: 1.00    Years: 30.00    Pack years: 30.00    Types: Cigarettes    Start date: 09/30/1976  . Smokeless tobacco: Never Used  Substance and Sexual Activity  . Alcohol use: Yes    Alcohol/week: 4.0 standard drinks    Types: 4 Cans of beer per week    Comment: daily  . Drug use: Not Currently    Types: Marijuana    Comment: used to use cocaine past history  . Sexual activity: Yes    Partners: Female  Other Topics Concern  . Not on file  Social History Narrative   On his 4th marriage   Social Determinants of Health   Financial Resource Strain:   . Difficulty of Paying Living Expenses: Not on file  Food Insecurity:   . Worried About Charity fundraiser in the Last Year: Not on file  . Ran Out of Food in the Last Year: Not on file  Transportation Needs:   . Lack of Transportation (Medical): Not on file  . Lack of Transportation (Non-Medical): Not on file  Physical Activity:   . Days of Exercise per Week: Not on file  . Minutes of Exercise per Session: Not on file  Stress:   . Feeling of Stress : Not on file  Social Connections:   . Frequency of Communication with Friends and Family: Not on file  . Frequency of Social Gatherings with Friends and Family: Not on file  . Attends Religious Services: Not on file  . Active Member of Clubs or Organizations: Not on file  . Attends Archivist Meetings: Not on file  . Marital  Status: Not on file  Intimate Partner Violence:   . Fear of Current or Ex-Partner: Not on file  . Emotionally Abused: Not on file  . Physically Abused: Not on file  . Sexually Abused: Not on file     Family History  Problem Relation Age of Onset  . Diabetes Mother   . Diabetes Father   . Heart attack Father   . Cancer Brother        Skin and Prostate-Oldest Brother  . Diabetes Brother  Review of Systems Positive for chest pain shortness of breath Negative for: General:  chills, fever, night sweats or weight changes.  Cardiovascular: PND orthopnea syncope dizziness  Dermatological skin lesions rashes Respiratory: Cough congestion Urologic: Frequent urination urination at night and hematuria Abdominal: negative for nausea, vomiting, diarrhea, bright red blood per rectum, melena, or hematemesis Neurologic: negative for visual changes, and/or hearing changes  All other systems reviewed and are otherwise negative except as noted above.  Labs: No results for input(s): CKTOTAL, CKMB, TROPONINI in the last 72 hours. Lab Results  Component Value Date   WBC 13.3 (H) 03/13/2019   HGB 12.8 (L) 03/13/2019   HCT 39.3 03/13/2019   MCV 83.1 03/13/2019   PLT 365 03/13/2019    Recent Labs  Lab 03/13/19 0210  NA 138  K 3.4*  CL 102  CO2 26  BUN 14  CREATININE 0.59*  CALCIUM 8.8*  GLUCOSE 113*   Lab Results  Component Value Date   CHOL 164 03/13/2019   HDL 26 (L) 03/13/2019   LDLCALC 88 03/13/2019   TRIG 248 (H) 03/13/2019   No results found for: DDIMER  Radiology/Studies:  DG Chest 2 View  Result Date: 03/12/2019 CLINICAL DATA:  54 year old male with left side chest pain radiating to the back in both arms for 2 days. No improvement with nitroglycerin. EXAM: CHEST - 2 VIEW COMPARISON:  CT chest 12/21/2018 and earlier. FINDINGS: Mediastinal contours remain normal. Visualized tracheal air column is within normal limits. Stable lung volumes. Coarse bilateral increased  pulmonary interstitial markings have not significantly changed since 2015. No pneumothorax, pulmonary edema, pleural effusion or acute pulmonary opacity. No acute osseous abnormality identified. Negative visible bowel gas pattern. IMPRESSION: No acute cardiopulmonary abnormality. Electronically Signed   By: Genevie Ann M.D.   On: 03/12/2019 19:44   DG Hand Complete Left  Result Date: 03/06/2019 CLINICAL DATA:  Injury with table saw EXAM: LEFT HAND - COMPLETE 3+ VIEW COMPARISON:  None. FINDINGS: Frontal, oblique, and lateral views obtained. There are foci of soft tissue injury to the distal second and third digits. No radiopaque foreign body. No fracture or dislocation. Joint spaces appear normal. A small calcification adjacent to the trapezium bone is probably of arthropathic etiology. No erosive change. IMPRESSION: Soft tissue injuries to the distal second and third digits. No fracture or dislocation. No joint space narrowing or erosion. Electronically Signed   By: Lowella Grip III M.D.   On: 03/06/2019 18:57    EKG: Normal sinus rhythm  Weights: Filed Weights   03/12/19 2355  Weight: 99.5 kg     Physical Exam: Blood pressure (!) 148/91, pulse 90, temperature 98.2 F (36.8 C), temperature source Temporal, resp. rate 17, height 5\' 7"  (1.702 m), weight 99.5 kg, SpO2 97 %. Body mass index is 34.36 kg/m. General: Well developed, well nourished, in no acute distress. Head eyes ears nose throat: Normocephalic, atraumatic, sclera non-icteric, no xanthomas, nares are without discharge. No apparent thyromegaly and/or mass  Lungs: Normal respiratory effort.  no wheezes, no rales, no rhonchi.  Heart: RRR with normal S1 S2. no murmur gallop, no rub, PMI is normal size and placement, carotid upstroke normal without bruit, jugular venous pressure is normal Abdomen: Soft, non-tender, non-distended with normoactive bowel sounds. No hepatomegaly. No rebound/guarding. No obvious abdominal masses. Abdominal  aorta is normal size without bruit Extremities: No edema. no cyanosis, no clubbing, no ulcers  Peripheral : 2+ bilateral upper extremity pulses, 2+ bilateral femoral pulses, 2+ bilateral dorsal pedal pulse  Neuro: Alert and oriented. No facial asymmetry. No focal deficit. Moves all extremities spontaneously. Musculoskeletal: Normal muscle tone without kyphosis Psych:  Responds to questions appropriately with a normal affect.    Assessment: 54 year old male with hypertension hyperlipidemia tobacco abuse COPD sleep apnea having a non-ST elevation myocardial function  Plan: 1.  Continue heparin nitrates aspirin for further risk reduction of myocardial infarction 2.  Beta-blocker and possible ACE inhibitor for non-ST elevation myocardial infarction 3.  Echocardiogram for LV systolic dysfunction valvular heart disease contributing to above 4.  Proceed to cardiac catheterization to assess coronary anatomy and further treatment thereof as necessary.  Patient understands the risk and benefits of cardiac catheterization.  This includes the possibility of death stroke heart attack infection bleeding or blood clot.  He is at low risk for conscious sedation  Signed, Corey Skains M.D. [facility] 03/13/2019, 9:52 AM

## 2019-03-13 NOTE — Progress Notes (Signed)
Tried pt on CPAP per NP order. Tried several different settings on CPAP, pt stated he could not tolerate. Placed pt on Auto Bipap, pt reluctantly stated he would try. Within a few minutes pt had removed himself from the [place], per RN. NP aware.

## 2019-03-13 NOTE — Progress Notes (Signed)
Rufina Falco, NP at bedside to assess pt.

## 2019-03-13 NOTE — Progress Notes (Signed)
ANTICOAGULATION CONSULT NOTE - Initial Consult  Pharmacy Consult for Heparin Drip Indication: chest pain/ACS  Allergies  Allergen Reactions  . Ciprofloxacin Other (See Comments)    Body aches  . Crestor [Rosuvastatin Calcium] Other (See Comments)    Muscles aches  . Droperidol Other (See Comments)    Heart stopped  . Lipofen  [Fenofibrate]     muscle aches muscle aches  . Niacin Er     flushed    Patient Measurements: Height: 5\' 7"  (170.2 cm) Weight: 219 lb 5.7 oz (99.5 kg) IBW/kg (Calculated) : 66.1 Heparin Dosing Weight: 87.78 kg  Vital Signs: Temp: 97.6 F (36.4 C) (01/25 2355) Temp Source: Tympanic (01/25 2355) BP: 155/84 (01/25 2355) Pulse Rate: 79 (01/26 0352)  Labs: Recent Labs    03/12/19 1928 03/12/19 2207 03/13/19 0210  HGB 13.7  --  12.8*  HCT 42.1  --  39.3  PLT 387  --  365  APTT  --  26  --   LABPROT  --  13.2  --   INR  --  1.0  --   HEPARINUNFRC  --   --  0.10*  CREATININE 0.61  --  0.59*  TROPONINIHS 539* 422*  --     Estimated Creatinine Clearance: 117.3 mL/min (A) (by C-G formula based on SCr of 0.59 mg/dL (L)).   Medical History: Past Medical History:  Diagnosis Date  . Allergy   . Arthritis   . BPH (benign prostatic hyperplasia)   . COPD (chronic obstructive pulmonary disease) ([place])   . Drug abuse ([place])   . Dysmetabolic syndrome X   . Erosive gastritis   . History of hip replacement 12/2016  . History of knee replacement 01/2015  . History of left hip replacement 12/27/2016  . Hyperlipidemia   . Hypertension   . Hypogonadism male   . Obesity   . Obsessive compulsive disorder   . OSA (obstructive sleep apnea)   . Radiculitis   . Stomatitis monilial   . Stroke ([place])   . Thrombocytosis ([facility])     Medications:  Scheduled:  . aspirin  324 mg Oral NOW   Or  . aspirin  300 mg Rectal NOW  . aspirin EC  81 mg Oral Daily  . carvedilol  12.5 mg Oral BID WC  . heparin  2,600 Units Intravenous Once   Infusions:  . heparin  1,150 Units/hr (03/12/19 2204)    Assessment: Pharmacy consulted to dose and monitor a heparin drip for this 54 yo male presenting to ED with chest pain.  Elevated troponin of 539 indicating likely NSTEMI.   Goal of Therapy:  Heparin level 0.3-0.7 units/ml Monitor platelets by anticoagulation protocol: Yes   Plan:  0126 @ 0500 HL 0.10 subtherapeutic. Will rebolus w/ heparin 2600 units IV and increase 1300 units/hr and will recheck HL @ 1000, will continue to monitor.  Tobie Lords, PharmD, BCPS Clinical Pharmacist 03/13/2019,4:02 AM

## 2019-03-14 ENCOUNTER — Encounter: Payer: Self-pay | Admitting: Internal Medicine

## 2019-03-14 ENCOUNTER — Encounter
Admission: EM | Disposition: A | Discharge status: Home / Self Care | Payer: Self-pay | Source: Home / Self Care | Attending: Internal Medicine

## 2019-03-14 HISTORY — PX: CORONARY BALLOON ANGIOPLASTY: CATH118233

## 2019-03-14 HISTORY — PX: LEFT HEART CATH AND CORONARY ANGIOGRAPHY: CATH118249

## 2019-03-14 LAB — CBC
HCT: 42 % (ref 39.0–52.0)
Hemoglobin: 13.7 g/dL (ref 13.0–17.0)
MCH: 27.5 pg (ref 26.0–34.0)
MCHC: 32.6 g/dL (ref 30.0–36.0)
MCV: 84.2 fL (ref 80.0–100.0)
Platelets: 366 10*3/uL (ref 150–400)
RBC: 4.99 MIL/uL (ref 4.22–5.81)
RDW: 12.7 % (ref 11.5–15.5)
WBC: 12.1 10*3/uL — ABNORMAL HIGH (ref 4.0–10.5)
nRBC: 0 % (ref 0.0–0.2)

## 2019-03-14 LAB — HEPARIN LEVEL (UNFRACTIONATED): Heparin Unfractionated: 0.27 IU/mL — ABNORMAL LOW (ref 0.30–0.70)

## 2019-03-14 LAB — POCT ACTIVATED CLOTTING TIME: Activated Clotting Time: 367 seconds

## 2019-03-14 SURGERY — LEFT HEART CATH AND CORONARY ANGIOGRAPHY
Anesthesia: Moderate Sedation

## 2019-03-14 MED ORDER — ASPIRIN 81 MG PO CHEW
CHEWABLE_TABLET | ORAL | Status: AC
  Filled 2019-03-14: qty 1

## 2019-03-14 MED ORDER — SODIUM CHLORIDE 0.9 % WEIGHT BASED INFUSION
3.0000 mL/kg/h | INTRAVENOUS | Status: DC
  Administered 2019-03-14: 3 mL/kg/h via INTRAVENOUS

## 2019-03-14 MED ORDER — FENTANYL CITRATE (PF) 100 MCG/2ML IJ SOLN
INTRAMUSCULAR | Status: AC
  Filled 2019-03-14: qty 2

## 2019-03-14 MED ORDER — TICAGRELOR 90 MG PO TABS
ORAL_TABLET | ORAL | Status: AC
  Filled 2019-03-14: qty 1

## 2019-03-14 MED ORDER — HYDRALAZINE HCL 20 MG/ML IJ SOLN: 10.0000 mg | INTRAMUSCULAR | Status: AC | PRN

## 2019-03-14 MED ORDER — POTASSIUM CHLORIDE CRYS ER 20 MEQ PO TBCR
40.0000 meq | EXTENDED_RELEASE_TABLET | Freq: Once | ORAL | Status: AC
  Administered 2019-03-14: 40 meq via ORAL
  Filled 2019-03-14: qty 2

## 2019-03-14 MED ORDER — ISOSORBIDE MONONITRATE ER 30 MG PO TB24
30.0000 mg | ORAL_TABLET | Freq: Every day | ORAL | Status: DC
  Administered 2019-03-14 – 2019-03-15 (×2): 30 mg via ORAL
  Filled 2019-03-14 (×2): qty 1

## 2019-03-14 MED ORDER — HEPARIN SODIUM (PORCINE) 1000 UNIT/ML IJ SOLN
INTRAMUSCULAR | Status: AC
  Filled 2019-03-14: qty 1

## 2019-03-14 MED ORDER — SODIUM CHLORIDE 0.9% FLUSH: 3.0000 mL | INTRAVENOUS | Status: DC | PRN

## 2019-03-14 MED ORDER — SODIUM CHLORIDE 0.9 % IV SOLN
INTRAVENOUS | Status: DC | PRN
  Administered 2019-03-14: 10:00:00 1.75 mg/kg/h via INTRAVENOUS

## 2019-03-14 MED ORDER — ASPIRIN 81 MG PO CHEW
CHEWABLE_TABLET | ORAL | Status: AC
  Filled 2019-03-14: qty 3

## 2019-03-14 MED ORDER — VERAPAMIL HCL 2.5 MG/ML IV SOLN
INTRAVENOUS | Status: AC
  Filled 2019-03-14: qty 2

## 2019-03-14 MED ORDER — MIDAZOLAM HCL 2 MG/2ML IJ SOLN
INTRAMUSCULAR | Status: DC | PRN
  Administered 2019-03-14: 1 mg via INTRAVENOUS

## 2019-03-14 MED ORDER — CLOPIDOGREL BISULFATE 75 MG PO TABS
75.0000 mg | ORAL_TABLET | Freq: Every day | ORAL | Status: DC
  Administered 2019-03-15: 75 mg via ORAL
  Filled 2019-03-14: qty 1

## 2019-03-14 MED ORDER — SODIUM CHLORIDE 0.9 % WEIGHT BASED INFUSION: 1.0000 mL/kg/h | INTRAVENOUS | Status: DC

## 2019-03-14 MED ORDER — HYDROCORTISONE 1 % EX CREA
TOPICAL_CREAM | Freq: Two times a day (BID) | CUTANEOUS | Status: DC
  Filled 2019-03-14: qty 28

## 2019-03-14 MED ORDER — BIVALIRUDIN TRIFLUOROACETATE 250 MG IV SOLR
INTRAVENOUS | Status: AC
  Filled 2019-03-14: qty 250

## 2019-03-14 MED ORDER — VERAPAMIL HCL 2.5 MG/ML IV SOLN
INTRAVENOUS | Status: DC | PRN
  Administered 2019-03-14: 2.5 mg via INTRA_ARTERIAL

## 2019-03-14 MED ORDER — ATORVASTATIN CALCIUM 80 MG PO TABS
80.0000 mg | ORAL_TABLET | Freq: Every day | ORAL | Status: DC
  Administered 2019-03-14: 80 mg via ORAL
  Filled 2019-03-14: qty 1

## 2019-03-14 MED ORDER — ENOXAPARIN SODIUM 40 MG/0.4ML ~~LOC~~ SOLN
40.0000 mg | SUBCUTANEOUS | Status: DC
  Administered 2019-03-15: 40 mg via SUBCUTANEOUS
  Filled 2019-03-14: qty 0.4

## 2019-03-14 MED ORDER — ACETAMINOPHEN 325 MG PO TABS: 650.0000 mg | ORAL_TABLET | ORAL | Status: DC | PRN

## 2019-03-14 MED ORDER — BIVALIRUDIN BOLUS VIA INFUSION - CUPID
INTRAVENOUS | Status: DC | PRN
  Administered 2019-03-14: 10:00:00 76.575 mg via INTRAVENOUS

## 2019-03-14 MED ORDER — ASPIRIN 81 MG PO CHEW
81.0000 mg | CHEWABLE_TABLET | Freq: Every day | ORAL | Status: DC
  Administered 2019-03-15: 81 mg via ORAL
  Filled 2019-03-14: qty 1

## 2019-03-14 MED ORDER — IOHEXOL 300 MG/ML  SOLN
INTRAMUSCULAR | Status: DC | PRN
  Administered 2019-03-14: 150 mL via INTRA_ARTERIAL

## 2019-03-14 MED ORDER — SODIUM CHLORIDE 0.9% FLUSH
3.0000 mL | Freq: Two times a day (BID) | INTRAVENOUS | Status: DC
  Administered 2019-03-14 – 2019-03-15 (×2): 3 mL via INTRAVENOUS

## 2019-03-14 MED ORDER — FENTANYL CITRATE (PF) 100 MCG/2ML IJ SOLN
INTRAMUSCULAR | Status: DC | PRN
  Administered 2019-03-14: 50 ug via INTRAVENOUS

## 2019-03-14 MED ORDER — SODIUM CHLORIDE 0.9 % IV SOLN: 250.0000 mL | INTRAVENOUS | Status: DC | PRN

## 2019-03-14 MED ORDER — ASPIRIN 81 MG PO CHEW
81.0000 mg | CHEWABLE_TABLET | ORAL | Status: AC
  Administered 2019-03-14: 81 mg via ORAL

## 2019-03-14 MED ORDER — ASPIRIN 81 MG PO CHEW
CHEWABLE_TABLET | ORAL | Status: DC | PRN
  Administered 2019-03-14: 243 mg via ORAL

## 2019-03-14 MED ORDER — MIDAZOLAM HCL 2 MG/2ML IJ SOLN
INTRAMUSCULAR | Status: AC
  Filled 2019-03-14: qty 2

## 2019-03-14 MED ORDER — HEPARIN (PORCINE) IN NACL 1000-0.9 UT/500ML-% IV SOLN
INTRAVENOUS | Status: AC
  Filled 2019-03-14: qty 1000

## 2019-03-14 MED ORDER — HEPARIN SODIUM (PORCINE) 1000 UNIT/ML IJ SOLN
INTRAMUSCULAR | Status: DC | PRN
  Administered 2019-03-14: 5000 [IU] via INTRAVENOUS

## 2019-03-14 MED ORDER — SODIUM CHLORIDE 0.9 % WEIGHT BASED INFUSION
1.0000 mL/kg/h | INTRAVENOUS | Status: AC
  Administered 2019-03-14: 1 mL/kg/h via INTRAVENOUS

## 2019-03-14 MED ORDER — TICAGRELOR 90 MG PO TABS
ORAL_TABLET | ORAL | Status: DC | PRN
  Administered 2019-03-14: 180 mg via ORAL

## 2019-03-14 MED ORDER — ACETAMINOPHEN 325 MG PO TABS
ORAL_TABLET | ORAL | Status: AC
  Filled 2019-03-14: qty 2

## 2019-03-14 MED ORDER — IOHEXOL 300 MG/ML  SOLN
INTRAMUSCULAR | Status: DC | PRN
  Administered 2019-03-14: 120 mL

## 2019-03-14 MED ORDER — ONDANSETRON HCL 4 MG/2ML IJ SOLN: 4.0000 mg | Freq: Four times a day (QID) | INTRAMUSCULAR | Status: DC | PRN

## 2019-03-14 MED ORDER — LABETALOL HCL 5 MG/ML IV SOLN: 10.0000 mg | INTRAVENOUS | Status: AC | PRN

## 2019-03-14 MED ORDER — CARVEDILOL 12.5 MG PO TABS
ORAL_TABLET | ORAL | Status: AC
  Filled 2019-03-14: qty 1

## 2019-03-14 SURGICAL SUPPLY — 13 items
CATH INFINITI 5 FR JL3.5 (CATHETERS) ×2 IMPLANT
CATH INFINITI 5FR ANG PIGTAIL (CATHETERS) ×2 IMPLANT
CATH INFINITI JR4 5F (CATHETERS) ×2 IMPLANT
CATH VISTA GUIDE 6FR XB3.5 SH (CATHETERS) ×2 IMPLANT
DEVICE INFLAT 30 PLUS (MISCELLANEOUS) ×2 IMPLANT
DEVICE RAD TR BAND REGULAR (VASCULAR PRODUCTS) ×2 IMPLANT
GLIDESHEATH SLEND SS 6F .021 (SHEATH) ×2 IMPLANT
KIT MANI 3VAL PERCEP (MISCELLANEOUS) ×2 IMPLANT
PACK CARDIAC CATH (CUSTOM PROCEDURE TRAY) ×2 IMPLANT
WIRE G HI TQ BMW 190 (WIRE) ×2 IMPLANT
WIRE HI TORQ WHISPER MS 190CM (WIRE) ×2 IMPLANT
WIRE ROSEN-J .035X260CM (WIRE) ×2 IMPLANT
WIRE RUNTHROUGH .014X180CM (WIRE) ×2 IMPLANT

## 2019-03-14 NOTE — Progress Notes (Signed)
[facility] Encounter Note  Patient: Tim Anderson / Admit Date: 03/12/2019 / Date of Encounter: 03/14/2019, 2:04 PM   Subjective: Patient with no further evidence of chest discomfort.  Patient hemodynamically stable without evidence of congestive heart failure.  Patient tolerating medications well Cardiac catheterization showing normal LV systolic function with ejection fraction of 50% Occluded with recanalization of proximal circumflex with collaterals to the first obtuse marginal Minimal atherosclerosis of left anterior descending artery and right coronary artery  Review of Systems: Positive for: None Negative for: Vision change, hearing change, syncope, dizziness, nausea, vomiting,diarrhea, bloody stool, stomach pain, cough, congestion, diaphoresis, urinary frequency, urinary pain,skin lesions, skin rashes Others previously listed  Objective: Telemetry: Normal sinus rhythm Physical Exam: Blood pressure (!) 139/93, pulse 74, temperature 98.3 F (36.8 C), temperature source Oral, resp. rate 18, height 5\' 8"  (1.727 m), weight 100.3 kg, SpO2 98 %. Body mass index is 33.63 kg/m. General: Well developed, well nourished, in no acute distress. Head: Normocephalic, atraumatic, sclera non-icteric, no xanthomas, nares are without discharge. Neck: No apparent masses Lungs: Normal respirations with no wheezes, no rhonchi, no rales , no crackles   Heart: Regular rate and rhythm, normal S1 S2, no murmur, no rub, no gallop, PMI is normal size and placement, carotid upstroke normal without bruit, jugular venous pressure normal Abdomen: Soft, non-tender, non-distended with normoactive bowel sounds. No hepatosplenomegaly. Abdominal aorta is normal size without bruit Extremities: No edema, no clubbing, no cyanosis, no ulcers,  Peripheral: 2+ radial, 2+ femoral, 2+ dorsal pedal pulses Neuro: Alert and oriented. Moves all extremities spontaneously. Psych:  Responds to  questions appropriately with a normal affect.   Intake/Output Summary (Last 24 hours) at 03/14/2019 1404 Last data filed at 03/14/2019 1200 Gross per 24 hour  Intake 1222 ml  Output 2100 ml  Net -878 ml    Inpatient Medications:  . acetaminophen      . aspirin      . aspirin  81 mg Oral Daily  . atorvastatin  80 mg Oral q1800  . carvedilol  12.5 mg Oral BID WC  . [START ON 03/15/2019] clopidogrel  75 mg Oral Q breakfast  . [START ON 03/15/2019] enoxaparin (LOVENOX) injection  40 mg Subcutaneous Q24H  . fluticasone furoate-vilanterol  1 puff Inhalation Daily   And  . umeclidinium bromide  1 puff Inhalation Daily  . hydroxychloroquine  200 mg Oral QHS  . pantoprazole  40 mg Oral Daily  . potassium chloride  40 mEq Oral Once  . sodium chloride flush  3 mL Intravenous Q12H  . tamsulosin  0.4 mg Oral QPC supper   Infusions:  . sodium chloride    . sodium chloride      Labs: Recent Labs    03/12/19 1928 03/13/19 0210  NA 137 138  K 3.7 3.4*  CL 101 102  CO2 26 26  GLUCOSE 98 113*  BUN 11 14  CREATININE 0.61 0.59*  CALCIUM 9.2 8.8*   No results for input(s): AST, ALT, ALKPHOS, BILITOT, PROT, ALBUMIN in the last 72 hours. Recent Labs    03/13/19 0210 03/14/19 0056  WBC 13.3* 12.1*  HGB 12.8* 13.7  HCT 39.3 42.0  MCV 83.1 84.2  PLT 365 366   No results for input(s): CKTOTAL, CKMB, TROPONINI in the last 72 hours. Invalid input(s): POCBNP No results for input(s): HGBA1C in the last 72 hours.   Weights: Filed Weights   03/14/19 0733 03/14/19 0815 03/14/19 1348  Weight: 96.8 kg 102.1  kg 100.3 kg     Radiology/Studies:  DG Chest 2 View  Result Date: 03/12/2019 CLINICAL DATA:  54 year old male with left side chest pain radiating to the back in both arms for 2 days. No improvement with nitroglycerin. EXAM: CHEST - 2 VIEW COMPARISON:  CT chest 12/21/2018 and earlier. FINDINGS: Mediastinal contours remain normal. Visualized tracheal air column is within normal  limits. Stable lung volumes. Coarse bilateral increased pulmonary interstitial markings have not significantly changed since 2015. No pneumothorax, pulmonary edema, pleural effusion or acute pulmonary opacity. No acute osseous abnormality identified. Negative visible bowel gas pattern. IMPRESSION: No acute cardiopulmonary abnormality. Electronically Signed   By: Genevie Ann M.D.   On: 03/12/2019 19:44   CARDIAC CATHETERIZATION  Result Date: 03/14/2019  Prox Cx to Mid Cx lesion is 100% stenosed.  Prox LAD to Mid LAD lesion is 25% stenosed with 30% stenosed side branch in 1st Sept.  Ost LM to Mid LM lesion is 30% stenosed.  Mid RCA lesion is 20% stenosed.  54 year old male with hypertension hyperlipidemia and tobacco abuse having acute non-ST elevation myocardial infarction Left ventricle showing normal LV systolic function with no evidence of hypokinesis and ejection fraction of 50% Occlusion of proximal to mid circumflex affecting first obtuse marginal with collateralization from circumflex Plan Continue medical management of risk factors including beta-blocker ACE inhibitor Dual antiplatelet therapy for 12 months High intensity cholesterol therapy Discontinuation of tobacco abuse Cardiac rehabilitation   CARDIAC CATHETERIZATION  Result Date: 03/14/2019  Mid Cx lesion is 100% stenosed. CTO  Conclusion Unsuccessful attempt at PCI and stent of mid circumflex CTO Unable to cross with multiple wire attempts   DG Hand Complete Left  Result Date: 03/06/2019 CLINICAL DATA:  Injury with table saw EXAM: LEFT HAND - COMPLETE 3+ VIEW COMPARISON:  None. FINDINGS: Frontal, oblique, and lateral views obtained. There are foci of soft tissue injury to the distal second and third digits. No radiopaque foreign body. No fracture or dislocation. Joint spaces appear normal. A small calcification adjacent to the trapezium bone is probably of arthropathic etiology. No erosive change. IMPRESSION: Soft tissue injuries to the  distal second and third digits. No fracture or dislocation. No joint space narrowing or erosion. Electronically Signed   By: Lowella Grip III M.D.   On: 03/06/2019 18:57   ECHOCARDIOGRAM COMPLETE  Result Date: 03/13/2019   ECHOCARDIOGRAM REPORT   Patient Name:   Tim Anderson Date of Exam: 03/13/2019 Medical Rec #:  [id number]            Height:       67.0 in Accession #:    [id number]           Weight:       219.4 lb Date of Birth:  [birth year]             BSA:          2.10 m Patient Age:    54 years             BP:           148/91 mmHg Patient Gender: M                    HR:           73 bpm. Exam Location:  [facility] Procedure: 2D Echo, Color Doppler, Cardiac Doppler and Intracardiac            Opacification Agent Indications:     R07.9 Chest Pain  History:         Patient has no prior history of Echocardiogram examinations.                  Stroke and COPD; Risk Factors:Hypertension and Dyslipidemia.  Sonographer:     Charmayne Sheer RDCS (AE) Referring Phys:  [id number] Athena Masse Diagnosing Phys: Serafina Royals MD IMPRESSIONS  1. Left ventricular ejection fraction, by visual estimation, is 55 to 60%. The left ventricle has normal function. There is no left ventricular hypertrophy.  2. Definity contrast agent was given IV to delineate the left ventricular endocardial borders.  3. The left ventricle has no regional wall motion abnormalities.  4. Global right ventricle has normal systolic function.The right ventricular size is normal. No increase in right ventricular wall thickness.  5. Left atrial size was normal.  6. Right atrial size was normal.  7. The mitral valve is normal in structure. Trivial mitral valve regurgitation.  8. The tricuspid valve is normal in structure.  9. The tricuspid valve is normal in structure. Tricuspid valve regurgitation is trivial. 10. The aortic valve is normal in structure. Aortic valve regurgitation is trivial. 11. The pulmonic valve was normal in structure. Pulmonic valve  regurgitation is not visualized. FINDINGS  Left Ventricle: Left ventricular ejection fraction, by visual estimation, is 55 to 60%. The left ventricle has normal function. Definity contrast agent was given IV to delineate the left ventricular endocardial borders. The left ventricle has no regional wall motion abnormalities. There is no left ventricular hypertrophy. Right Ventricle: The right ventricular size is normal. No increase in right ventricular wall thickness. Global RV systolic function is has normal systolic function. Left Atrium: Left atrial size was normal in size. Right Atrium: Right atrial size was normal in size Pericardium: There is no evidence of pericardial effusion. Mitral Valve: The mitral valve is normal in structure. Trivial mitral valve regurgitation. MV peak gradient, 4.2 mmHg. Tricuspid Valve: The tricuspid valve is normal in structure. Tricuspid valve regurgitation is trivial. Aortic Valve: The aortic valve is normal in structure. Aortic valve regurgitation is trivial. Aortic valve mean gradient measures 3.0 mmHg. Aortic valve peak gradient measures 5.4 mmHg. Aortic valve area, by VTI measures 4.03 cm. Pulmonic Valve: The pulmonic valve was normal in structure. Pulmonic valve regurgitation is not visualized. Pulmonic regurgitation is not visualized. Aorta: The aortic root, ascending aorta and aortic arch are all structurally normal, with no evidence of dilitation or obstruction. IAS/Shunts: No atrial level shunt detected by color flow Doppler.  LEFT VENTRICLE PLAX 2D LVIDd:         4.72 cm       Diastology LVIDs:         3.73 cm       LV e' lateral:   8.59 cm/s LV PW:         0.93 cm       LV E/e' lateral: 7.7 LV IVS:        0.74 cm       LV e' medial:    7.83 cm/s LVOT diam:     2.40 cm       LV E/e' medial:  8.5 LV SV:         44 ml LV SV Index:   20.11 LVOT Area:     4.52 cm  LV Volumes (MOD) LV area d, A2C:    39.20 cm LV area d, A4C:    31.90 cm LV area s, A2C:    22.80  cm LV area  s, A4C:    22.00 cm LV major d, A2C:   9.48 cm LV major d, A4C:   8.44 cm LV major s, A2C:   7.81 cm LV major s, A4C:   7.32 cm LV vol d, MOD A2C: 136.0 ml LV vol d, MOD A4C: 99.0 ml LV vol s, MOD A2C: 56.6 ml LV vol s, MOD A4C: 55.5 ml LV SV MOD A2C:     79.4 ml LV SV MOD A4C:     99.0 ml LV SV MOD BP:      65.0 ml RIGHT VENTRICLE RV Basal diam:  2.96 cm LEFT ATRIUM             Index       RIGHT ATRIUM           Index LA diam:        3.20 cm 1.52 cm/m  RA Area:     14.90 cm LA Vol (A2C):   42.9 ml 20.40 ml/m RA Volume:   35.20 ml  16.74 ml/m LA Vol (A4C):   21.6 ml 10.27 ml/m LA Biplane Vol: 31.2 ml 14.83 ml/m  AORTIC VALVE                   PULMONIC VALVE AV Area (Vmax):    4.02 cm    PV Vmax:       0.96 m/s AV Area (Vmean):   3.31 cm    PV Vmean:      68.000 cm/s AV Area (VTI):     4.03 cm    PV VTI:        0.186 m AV Vmax:           116.00 cm/s PV Peak grad:  3.7 mmHg AV Vmean:          88.500 cm/s PV Mean grad:  2.0 mmHg AV VTI:            0.220 m AV Peak Grad:      5.4 mmHg AV Mean Grad:      3.0 mmHg LVOT Vmax:         103.00 cm/s LVOT Vmean:        64.800 cm/s LVOT VTI:          0.196 m LVOT/AV VTI ratio: 0.89  AORTA Ao Root diam: 3.20 cm MITRAL VALVE MV Area (PHT): 3.77 cm             SHUNTS MV Peak grad:  4.2 mmHg             Systemic VTI:  0.20 m MV Mean grad:  2.0 mmHg             Systemic Diam: 2.40 cm MV Vmax:       1.03 m/s MV Vmean:      60.6 cm/s MV VTI:        0.24 m MV PHT:        58.29 msec MV Decel Time: 201 msec MV E velocity: 66.50 cm/s 103 cm/s MV A velocity: 88.80 cm/s 70.3 cm/s MV E/A ratio:  0.75       1.5  Serafina Royals MD Electronically signed by Serafina Royals MD Signature Date/Time: 03/13/2019/12:25:43 PM    Final      Assessment and Recommendation  54 y.o. male with known tobacco abuse hypertension hyperlipidemia having acute non-ST elevation myocardial infarction with occluded circumflex with recanalization and collateralization to the obtuse marginal with an attempt  for PCI total occlusion which was unsuccessful 1.  Continue medication management of non-ST elevation myocardial infarction including beta-blocker ACE inhibitor 2.  High intensity cholesterol therapy 3.  Dual antiplatelet therapy 4.  Cardiac rehabilitation 5.  If ambulating well with appropriate medication management including isosorbide and no evidence of chest discomfort would consider discharge home tomorrow with further medication management of about  Signed, Serafina Royals M.D. FACC

## 2019-03-14 NOTE — Progress Notes (Signed)
Patient off the floor to go to cath lab. Transported by Marliss Czar, RN & transport on the monitor.

## 2019-03-14 NOTE — Progress Notes (Signed)
[facility] for Heparin Drip Indication: chest pain/ACS  Allergies  Allergen Reactions  . Ciprofloxacin Other (See Comments)    Body aches  . Crestor [Rosuvastatin Calcium] Other (See Comments)    Muscles aches  . Droperidol Other (See Comments)    Heart stopped  . Lipofen  [Fenofibrate]     muscle aches muscle aches  . Niacin Er     flushed    Patient Measurements: Height: 5\' 7"  (170.2 cm) Weight: 219 lb 5.7 oz (99.5 kg) IBW/kg (Calculated) : 66.1 Heparin Dosing Weight: 87.78 kg  Vital Signs: Temp: 97.8 F (36.6 C) (01/26 1600) BP: 133/95 (01/27 0015) Pulse Rate: 81 (01/27 0015)  Labs: Recent Labs    03/12/19 1928 03/12/19 1928 03/12/19 2207 03/13/19 0210 03/13/19 0210 03/13/19 1017 03/13/19 1821 03/14/19 0056  HGB 13.7   < >  --  12.8*  --   --   --  13.7  HCT 42.1  --   --  39.3  --   --   --  42.0  PLT 387  --   --  365  --   --   --  366  APTT  --   --  26  --   --   --   --   --   LABPROT  --   --  13.2  --   --   --   --   --   INR  --   --  1.0  --   --   --   --   --   HEPARINUNFRC  --   --   --  0.10*   < > 0.16* 0.30 0.27*  CREATININE 0.61  --   --  0.59*  --   --   --   --   TROPONINIHS 539*  --  422*  --   --   --   --   --    < > = values in this interval not displayed.    Estimated Creatinine Clearance: 117.3 mL/min (A) (by C-G formula based on SCr of 0.59 mg/dL (L)).   Medical History: Past Medical History:  Diagnosis Date  . Allergy   . Arthritis   . BPH (benign prostatic hyperplasia)   . COPD (chronic obstructive pulmonary disease) ([place])   . Drug abuse ([place])   . Dysmetabolic syndrome X   . Erosive gastritis   . History of hip replacement 12/2016  . History of knee replacement 01/2015  . History of left hip replacement 12/27/2016  . Hyperlipidemia   . Hypertension   . Hypogonadism male   . Obesity   . Obsessive compulsive disorder   . OSA (obstructive sleep apnea)   . Radiculitis   .  Stomatitis monilial   . Stroke ([place])   . Thrombocytosis ([facility])     Medications:  Scheduled:  . aspirin EC  81 mg Oral Daily  . carvedilol  12.5 mg Oral BID WC  . fluticasone furoate-vilanterol  1 puff Inhalation Daily   And  . umeclidinium bromide  1 puff Inhalation Daily  . hydroxychloroquine  200 mg Oral QHS  . pantoprazole  40 mg Oral Daily  . sodium chloride flush  3 mL Intravenous Q12H  . tamsulosin  0.4 mg Oral QPC supper   Infusions:  . heparin 1,550 Units/hr (03/13/19 1136)    Assessment: Pharmacy consulted to dose and monitor a heparin drip for this 54 yo male  presenting to ED with chest pain.  Elevated troponin of 539 indicating likely NSTEMI.   0126 02100 HL 0.10 subtherapeutic. Will rebolus w/ heparin 2600 units IV and increase 1300 units/hr and will recheck HL @ 1000, 1/26 1017 HL 0.16 rebolus w/ heparin 2600 units IV and increase 1550 units/hr 1/26 1821 HL 0.3   Goal of Therapy:  Heparin level 0.3-0.7 units/ml Monitor platelets by anticoagulation protocol: Yes   Plan:  01/27 @ 0100 HL 0.27 subtherapeutic. Increase rate to 1650 units/hr and recheck HL @ 0800 and continue to monitor.  Tobie Lords, PharmD, BCPS Clinical Pharmacist 03/14/2019

## 2019-03-14 NOTE — Progress Notes (Signed)
Patient has refused cpap. States he cannot use a mask of any sort. He must use nasal pillows.

## 2019-03-14 NOTE — Plan of Care (Signed)

## 2019-03-14 NOTE — Progress Notes (Signed)
PROGRESS NOTE    Tim Anderson  [id number] DOB: 07/23/1965 DOA: 03/12/2019 PCP: Steele Sizer, MD   Brief Narrative:  Tim Anderson is a 54 y.o. male with medical history significant for COPD, hypertension, diastolic heart failure, rheumatoid arthritis and obstructive sleep apnea family history of MI in father, history of positive stress test in 12/2016 for preop clearance, who presents to the emergency room with a 3-day history of sided chest pain.Marland Kitchen  He describes it as a mild to moderate intensity aching pain radiating across his chest and down bilateral arms, neck and shoulder blades, no aggravating or alleviating factors.  Took nitroglycerin and did not help.  Also use muscle rubs without improvement.ED stated he believed it is related to muscle strain following a carpentry incident with a saw 4 days ago in which he sustained laceration of the left second and third fingers requiring an ER visit.  Patient was advised to come in to the ER for evaluation of chest pain by his primary care provider.  On arrival, he was chest pain-free.   He denies associated nausea vomiting, diaphoresis, palpitations. Did have brief  lightheadedness.  Denies cough, fever chills or shortness of breath.  1/27: Patient seen and examined after cardiac catheterization.  PCI unsuccessful.  Cardiology consult note.  Recommending medical management optimization from a cardiac standpoint.  Patient stable.  Chest pain-free.  Heparin GTT discontinued.  Assessment & Plan:   Principal Problem:   NSTEMI (non-ST elevated myocardial infarction) ([place]) Active Problems:   Benign prostatic hyperplasia   Chronic obstructive pulmonary disease (HCC)   Dyslipidemia   HTN (hypertension)   Obstructive sleep apnea syndrome   Rheumatoid arthritis involving multiple joints (HCC)   Grade II diastolic dysfunction   MI, acute, non ST segment elevation ([place])  NSTEMI Chest pain Coronary artery disease Status post cardiac  catheterization PCI attempt unsuccessful Cardiology recommending medical management Beta-blocker, ACE inhibitor High intensity statin Dual antiplatelet therapy Cardiac rehabilitation Plan for discharge home tomorrow after ambulation  Hypertension Continue beta-blocker and ACE inhibitor as above  COPD Respiratory status stable Order Trelegy inhaler  Headache Resolved at this time  Sleep apnea Nocturnal CPAP  BPH Flomax  Rheumatoid arthritis Plaquenil  DVT prophylaxis: Lovenox, start tomorrow Code Status: Full Family Communication: None today  disposition Plan: Home, anticipate tomorrow  Consultants:   Cardiology- Nehemiah Massed  Procedures:   Left heart catheterization-03/14/2019  Antimicrobials:   None   Subjective: Patient seen and examined Status post cardiac catheterization No complaints Chest pain-free  Objective: Vitals:   03/14/19 1300 03/14/19 1315 03/14/19 1344 03/14/19 1348  BP:  138/80 (!) 139/93   Pulse: 71 68 74   Resp:  15 18   Temp:   98.3 F (36.8 C)   TempSrc:   Oral   SpO2:   98%   Weight:    100.3 kg  Height:    5\' 8"  (1.727 m)    Intake/Output Summary (Last 24 hours) at 03/14/2019 1430 Last data filed at 03/14/2019 1200 Gross per 24 hour  Intake 982 ml  Output 2100 ml  Net -1118 ml   Filed Weights   03/14/19 0733 03/14/19 0815 03/14/19 1348  Weight: 96.8 kg 102.1 kg 100.3 kg    Examination:  General exam: Appears calm and comfortable  Respiratory system: Clear to auscultation. Respiratory effort normal. Cardiovascular system: S1 & S2 heard, RRR. No JVD, murmurs, rubs, gallops or clicks. No pedal edema. Gastrointestinal system: Abdomen is nondistended, soft and nontender. No organomegaly or  masses felt. Normal bowel sounds heard. Central nervous system: Alert and oriented. No focal neurological deficits. Extremities: Symmetric 5 x 5 power. Skin: No rashes, lesions or ulcers Psychiatry: Judgement and insight appear normal.  Mood & affect appropriate.     Data Reviewed: I have personally reviewed following labs and imaging studies  CBC: Recent Labs  Lab 03/12/19 1928 03/13/19 0210 03/14/19 0056  WBC 17.6* 13.3* 12.1*  HGB 13.7 12.8* 13.7  HCT 42.1 39.3 42.0  MCV 83.9 83.1 84.2  PLT 387 365 [id number]   Basic Metabolic Panel: Recent Labs  Lab 03/12/19 1928 03/13/19 0210  NA 137 138  K 3.7 3.4*  CL 101 102  CO2 26 26  GLUCOSE 98 113*  BUN 11 14  CREATININE 0.61 0.59*  CALCIUM 9.2 8.8*   GFR: Estimated Creatinine Clearance: 119.8 mL/min (A) (by C-G formula based on SCr of 0.59 mg/dL (L)). Liver Function Tests: No results for input(s): AST, ALT, ALKPHOS, BILITOT, PROT, ALBUMIN in the last 168 hours. No results for input(s): LIPASE, AMYLASE in the last 168 hours. No results for input(s): AMMONIA in the last 168 hours. Coagulation Profile: Recent Labs  Lab 03/12/19 2207  INR 1.0   Cardiac Enzymes: No results for input(s): CKTOTAL, CKMB, CKMBINDEX, TROPONINI in the last 168 hours. BNP (last 3 results) No results for input(s): PROBNP in the last 8760 hours. HbA1C: No results for input(s): HGBA1C in the last 72 hours. CBG: No results for input(s): GLUCAP in the last 168 hours. Lipid Profile: Recent Labs    03/13/19 0210  CHOL 164  HDL 26*  LDLCALC 88  TRIG 248*  CHOLHDL 6.3   Thyroid Function Tests: No results for input(s): TSH, T4TOTAL, FREET4, T3FREE, THYROIDAB in the last 72 hours. Anemia Panel: No results for input(s): VITAMINB12, FOLATE, FERRITIN, TIBC, IRON, RETICCTPCT in the last 72 hours. Sepsis Labs: No results for input(s): PROCALCITON, LATICACIDVEN in the last 168 hours.  Recent Results (from the past 240 hour(s))  Respiratory Panel by RT PCR (Flu A&B, Covid) - Nasopharyngeal Swab     Status: None   Collection Time: 03/12/19 10:07 PM   Specimen: Nasopharyngeal Swab  Result Value Ref Range Status   SARS Coronavirus 2 by RT PCR NEGATIVE NEGATIVE Final    Comment:  (NOTE) SARS-CoV-2 target nucleic acids are NOT DETECTED. The SARS-CoV-2 RNA is generally detectable in upper respiratoy specimens during the acute phase of infection. The lowest concentration of SARS-CoV-2 viral copies this assay can detect is 131 copies/mL. A negative result does not preclude SARS-Cov-2 infection and should not be used as the sole basis for treatment or other patient management decisions. A negative result may occur with  improper specimen collection/handling, submission of specimen other than nasopharyngeal swab, presence of viral mutation(s) within the areas targeted by this assay, and inadequate number of viral copies (<131 copies/mL). A negative result must be combined with clinical observations, patient history, and epidemiological information. The expected result is Negative. Fact Sheet for Patients:  PinkCheek.be Fact Sheet for Healthcare Providers:  GravelBags.it This test is not yet ap proved or cleared by the [place] FDA and  has been authorized for detection and/or diagnosis of SARS-CoV-2 by FDA under an Emergency Use Authorization (EUA). This EUA will remain  in effect (meaning this test can be used) for the duration of the COVID-19 declaration under Section 564(b)(1) of the Act, 21 U.S.C. section 360bbb-3(b)(1), unless the authorization is terminated or revoked sooner.    Influenza A by PCR NEGATIVE  NEGATIVE Final   Influenza B by PCR NEGATIVE NEGATIVE Final    Comment: (NOTE) The Xpert Xpress SARS-CoV-2/FLU/RSV assay is intended as an aid in  the diagnosis of influenza from Nasopharyngeal swab specimens and  should not be used as a sole basis for treatment. Nasal washings and  aspirates are unacceptable for Xpert Xpress SARS-CoV-2/FLU/RSV  testing. Fact Sheet for Patients: PinkCheek.be Fact Sheet for Healthcare  Providers: GravelBags.it This test is not yet approved or cleared by the [place] FDA and  has been authorized for detection and/or diagnosis of SARS-CoV-2 by  FDA under an Emergency Use Authorization (EUA). This EUA will remain  in effect (meaning this test can be used) for the duration of the  Covid-19 declaration under Section 564(b)(1) of the Act, 21  U.S.C. section 360bbb-3(b)(1), unless the authorization is  terminated or revoked. Performed at [facility], [address] [place], [place] [address]          Radiology Studies: DG Chest 2 View  Result Date: 03/12/2019 CLINICAL DATA:  54 year old male with left side chest pain radiating to the back in both arms for 2 days. No improvement with nitroglycerin. EXAM: CHEST - 2 VIEW COMPARISON:  CT chest 12/21/2018 and earlier. FINDINGS: Mediastinal contours remain normal. Visualized tracheal air column is within normal limits. Stable lung volumes. Coarse bilateral increased pulmonary interstitial markings have not significantly changed since 2015. No pneumothorax, pulmonary edema, pleural effusion or acute pulmonary opacity. No acute osseous abnormality identified. Negative visible bowel gas pattern. IMPRESSION: No acute cardiopulmonary abnormality. Electronically Signed   By: Genevie Ann M.D.   On: 03/12/2019 19:44   CARDIAC CATHETERIZATION  Result Date: 03/14/2019  Prox Cx to Mid Cx lesion is 100% stenosed.  Prox LAD to Mid LAD lesion is 25% stenosed with 30% stenosed side branch in 1st Sept.  Ost LM to Mid LM lesion is 30% stenosed.  Mid RCA lesion is 20% stenosed.  54 year old male with hypertension hyperlipidemia and tobacco abuse having acute non-ST elevation myocardial infarction Left ventricle showing normal LV systolic function with no evidence of hypokinesis and ejection fraction of 50% Occlusion of proximal to mid circumflex affecting first obtuse marginal with collateralization from  circumflex Plan Continue medical management of risk factors including beta-blocker ACE inhibitor Dual antiplatelet therapy for 12 months High intensity cholesterol therapy Discontinuation of tobacco abuse Cardiac rehabilitation   CARDIAC CATHETERIZATION  Result Date: 03/14/2019  Mid Cx lesion is 100% stenosed. CTO  Conclusion Unsuccessful attempt at PCI and stent of mid circumflex CTO Unable to cross with multiple wire attempts   ECHOCARDIOGRAM COMPLETE  Result Date: 03/13/2019   ECHOCARDIOGRAM REPORT   Patient Name:   Tim Anderson Date of Exam: 03/13/2019 Medical Rec #:  [id number]            Height:       67.0 in Accession #:    [id number]           Weight:       219.4 lb Date of Birth:  08-07-1965             BSA:          2.10 m Patient Age:    79 years             BP:           148/91 mmHg Patient Gender: M  HR:           73 bpm. Exam Location:  [facility] Procedure: 2D Echo, Color Doppler, Cardiac Doppler and Intracardiac            Opacification Agent Indications:     R07.9 Chest Pain  History:         Patient has no prior history of Echocardiogram examinations.                  Stroke and COPD; Risk Factors:Hypertension and Dyslipidemia.  Sonographer:     Charmayne Sheer RDCS (AE) Referring Phys:  [id number] Athena Masse Diagnosing Phys: Serafina Royals MD IMPRESSIONS  1. Left ventricular ejection fraction, by visual estimation, is 55 to 60%. The left ventricle has normal function. There is no left ventricular hypertrophy.  2. Definity contrast agent was given IV to delineate the left ventricular endocardial borders.  3. The left ventricle has no regional wall motion abnormalities.  4. Global right ventricle has normal systolic function.The right ventricular size is normal. No increase in right ventricular wall thickness.  5. Left atrial size was normal.  6. Right atrial size was normal.  7. The mitral valve is normal in structure. Trivial mitral valve regurgitation.  8. The tricuspid  valve is normal in structure.  9. The tricuspid valve is normal in structure. Tricuspid valve regurgitation is trivial. 10. The aortic valve is normal in structure. Aortic valve regurgitation is trivial. 11. The pulmonic valve was normal in structure. Pulmonic valve regurgitation is not visualized. FINDINGS  Left Ventricle: Left ventricular ejection fraction, by visual estimation, is 55 to 60%. The left ventricle has normal function. Definity contrast agent was given IV to delineate the left ventricular endocardial borders. The left ventricle has no regional wall motion abnormalities. There is no left ventricular hypertrophy. Right Ventricle: The right ventricular size is normal. No increase in right ventricular wall thickness. Global RV systolic function is has normal systolic function. Left Atrium: Left atrial size was normal in size. Right Atrium: Right atrial size was normal in size Pericardium: There is no evidence of pericardial effusion. Mitral Valve: The mitral valve is normal in structure. Trivial mitral valve regurgitation. MV peak gradient, 4.2 mmHg. Tricuspid Valve: The tricuspid valve is normal in structure. Tricuspid valve regurgitation is trivial. Aortic Valve: The aortic valve is normal in structure. Aortic valve regurgitation is trivial. Aortic valve mean gradient measures 3.0 mmHg. Aortic valve peak gradient measures 5.4 mmHg. Aortic valve area, by VTI measures 4.03 cm. Pulmonic Valve: The pulmonic valve was normal in structure. Pulmonic valve regurgitation is not visualized. Pulmonic regurgitation is not visualized. Aorta: The aortic root, ascending aorta and aortic arch are all structurally normal, with no evidence of dilitation or obstruction. IAS/Shunts: No atrial level shunt detected by color flow Doppler.  LEFT VENTRICLE PLAX 2D LVIDd:         4.72 cm       Diastology LVIDs:         3.73 cm       LV e' lateral:   8.59 cm/s LV PW:         0.93 cm       LV E/e' lateral: 7.7 LV IVS:        0.74  cm       LV e' medial:    7.83 cm/s LVOT diam:     2.40 cm       LV E/e' medial:  8.5 LV SV:  44 ml LV SV Index:   20.11 LVOT Area:     4.52 cm  LV Volumes (MOD) LV area d, A2C:    39.20 cm LV area d, A4C:    31.90 cm LV area s, A2C:    22.80 cm LV area s, A4C:    22.00 cm LV major d, A2C:   9.48 cm LV major d, A4C:   8.44 cm LV major s, A2C:   7.81 cm LV major s, A4C:   7.32 cm LV vol d, MOD A2C: 136.0 ml LV vol d, MOD A4C: 99.0 ml LV vol s, MOD A2C: 56.6 ml LV vol s, MOD A4C: 55.5 ml LV SV MOD A2C:     79.4 ml LV SV MOD A4C:     99.0 ml LV SV MOD BP:      65.0 ml RIGHT VENTRICLE RV Basal diam:  2.96 cm LEFT ATRIUM             Index       RIGHT ATRIUM           Index LA diam:        3.20 cm 1.52 cm/m  RA Area:     14.90 cm LA Vol (A2C):   42.9 ml 20.40 ml/m RA Volume:   35.20 ml  16.74 ml/m LA Vol (A4C):   21.6 ml 10.27 ml/m LA Biplane Vol: 31.2 ml 14.83 ml/m  AORTIC VALVE                   PULMONIC VALVE AV Area (Vmax):    4.02 cm    PV Vmax:       0.96 m/s AV Area (Vmean):   3.31 cm    PV Vmean:      68.000 cm/s AV Area (VTI):     4.03 cm    PV VTI:        0.186 m AV Vmax:           116.00 cm/s PV Peak grad:  3.7 mmHg AV Vmean:          88.500 cm/s PV Mean grad:  2.0 mmHg AV VTI:            0.220 m AV Peak Grad:      5.4 mmHg AV Mean Grad:      3.0 mmHg LVOT Vmax:         103.00 cm/s LVOT Vmean:        64.800 cm/s LVOT VTI:          0.196 m LVOT/AV VTI ratio: 0.89  AORTA Ao Root diam: 3.20 cm MITRAL VALVE MV Area (PHT): 3.77 cm             SHUNTS MV Peak grad:  4.2 mmHg             Systemic VTI:  0.20 m MV Mean grad:  2.0 mmHg             Systemic Diam: 2.40 cm MV Vmax:       1.03 m/s MV Vmean:      60.6 cm/s MV VTI:        0.24 m MV PHT:        58.29 msec MV Decel Time: 201 msec MV E velocity: 66.50 cm/s 103 cm/s MV A velocity: 88.80 cm/s 70.3 cm/s MV E/A ratio:  0.75       1.5  Serafina Royals MD Electronically signed by Serafina Royals MD Signature  Date/Time: 03/13/2019/12:25:43 PM    Final          Scheduled Meds: . acetaminophen      . aspirin      . aspirin  81 mg Oral Daily  . atorvastatin  80 mg Oral q1800  . carvedilol  12.5 mg Oral BID WC  . [START ON 03/15/2019] clopidogrel  75 mg Oral Q breakfast  . [START ON 03/15/2019] enoxaparin (LOVENOX) injection  40 mg Subcutaneous Q24H  . fluticasone furoate-vilanterol  1 puff Inhalation Daily   And  . umeclidinium bromide  1 puff Inhalation Daily  . hydroxychloroquine  200 mg Oral QHS  . isosorbide mononitrate  30 mg Oral Daily  . pantoprazole  40 mg Oral Daily  . potassium chloride  40 mEq Oral Once  . sodium chloride flush  3 mL Intravenous Q12H  . tamsulosin  0.4 mg Oral QPC supper   Continuous Infusions: . sodium chloride    . sodium chloride       LOS: 1 day    Time spent: 35 min    Sidney Ace, MD [facility] Pager [phone]  If 7PM-7AM, please contact night-coverage [email or web address] 03/14/2019, 2:30 PM

## 2019-03-15 LAB — BASIC METABOLIC PANEL
Anion gap: 7 (ref 5–15)
BUN: 9 mg/dL (ref 6–20)
CO2: 25 mmol/L (ref 22–32)
Calcium: 9 mg/dL (ref 8.9–10.3)
Chloride: 108 mmol/L (ref 98–111)
Creatinine, Ser: 0.6 mg/dL — ABNORMAL LOW (ref 0.61–1.24)
GFR calc Af Amer: >60 mL/min (ref >60)
GFR calc non Af Amer: >60 mL/min (ref >60)
Glucose, Bld: 146 mg/dL — ABNORMAL HIGH (ref 70–99)
Potassium: 4.4 mmol/L (ref 3.5–5.1)
Sodium: 140 mmol/L (ref 135–145)

## 2019-03-15 LAB — CBC
HCT: 42.4 % (ref 39.0–52.0)
Hemoglobin: 13.5 g/dL (ref 13.0–17.0)
MCH: 27.1 pg (ref 26.0–34.0)
MCHC: 31.8 g/dL (ref 30.0–36.0)
MCV: 85 fL (ref 80.0–100.0)
Platelets: 351 10*3/uL (ref 150–400)
RBC: 4.99 MIL/uL (ref 4.22–5.81)
RDW: 12.8 % (ref 11.5–15.5)
WBC: 12.2 10*3/uL — ABNORMAL HIGH (ref 4.0–10.5)
nRBC: 0 % (ref 0.0–0.2)

## 2019-03-15 MED ORDER — ATORVASTATIN CALCIUM 80 MG PO TABS: 80.0000 mg | ORAL_TABLET | Freq: Every day | ORAL | 0 refills | Status: DC

## 2019-03-15 MED ORDER — ISOSORBIDE MONONITRATE ER 30 MG PO TB24: 30.0000 mg | ORAL_TABLET | Freq: Every day | ORAL | 0 refills | Status: DC

## 2019-03-15 MED ORDER — CLOPIDOGREL BISULFATE 75 MG PO TABS: 75.0000 mg | ORAL_TABLET | Freq: Every day | ORAL | 0 refills | Status: DC

## 2019-03-15 MED ORDER — NITROGLYCERIN 0.4 MG SL SUBL: 0.4000 mg | SUBLINGUAL_TABLET | SUBLINGUAL | 0 refills | Status: AC | PRN

## 2019-03-15 NOTE — Progress Notes (Signed)
[facility] Encounter Note  Patient: Tim Anderson / Admit Date: 03/12/2019 / Date of Encounter: 03/15/2019, 8:53 AM   Subjective: Patient with no further evidence of chest discomfort.  Patient hemodynamically stable without evidence of congestive heart failure.  Patient tolerating medications well Cardiac catheterization showing normal LV systolic function with ejection fraction of 50% Occluded with recanalization of proximal circumflex with collaterals to the first obtuse marginal Minimal atherosclerosis of left anterior descending artery and right coronary artery Attempted PCI and stent placement of occluded circumflex artery with no success but patient doing very well and medication management continued to be appropriate at this time after additional medications last night Review of Systems: Positive for: None Negative for: Vision change, hearing change, syncope, dizziness, nausea, vomiting,diarrhea, bloody stool, stomach pain, cough, congestion, diaphoresis, urinary frequency, urinary pain,skin lesions, skin rashes Others previously listed  Objective: Telemetry: Normal sinus rhythm Physical Exam: Blood pressure (!) 154/79, pulse 68, temperature 97.8 F (36.6 C), temperature source Oral, resp. rate 15, height 5\' 8"  (1.727 m), weight 100.3 kg, SpO2 99 %. Body mass index is 33.63 kg/m. General: Well developed, well nourished, in no acute distress. Head: Normocephalic, atraumatic, sclera non-icteric, no xanthomas, nares are without discharge. Neck: No apparent masses Lungs: Normal respirations with no wheezes, no rhonchi, no rales , no crackles   Heart: Regular rate and rhythm, normal S1 S2, no murmur, no rub, no gallop, PMI is normal size and placement, carotid upstroke normal without bruit, jugular venous pressure normal Abdomen: Soft, non-tender, non-distended with normoactive bowel sounds. No hepatosplenomegaly. Abdominal aorta is normal size without  bruit Extremities: No edema, no clubbing, no cyanosis, no ulcers,  Peripheral: 2+ radial, 2+ femoral, 2+ dorsal pedal pulses Neuro: Alert and oriented. Moves all extremities spontaneously. Psych:  Responds to questions appropriately with a normal affect.   Intake/Output Summary (Last 24 hours) at 03/15/2019 0853 Last data filed at 03/15/2019 0600 Gross per 24 hour  Intake 1506.12 ml  Output 500 ml  Net 1006.12 ml    Inpatient Medications:  . aspirin  81 mg Oral Daily  . atorvastatin  80 mg Oral q1800  . carvedilol  12.5 mg Oral BID WC  . clopidogrel  75 mg Oral Q breakfast  . enoxaparin (LOVENOX) injection  40 mg Subcutaneous Q24H  . fluticasone furoate-vilanterol  1 puff Inhalation Daily   And  . umeclidinium bromide  1 puff Inhalation Daily  . hydrocortisone cream   Topical BID  . hydroxychloroquine  200 mg Oral QHS  . isosorbide mononitrate  30 mg Oral Daily  . pantoprazole  40 mg Oral Daily  . sodium chloride flush  3 mL Intravenous Q12H  . tamsulosin  0.4 mg Oral QPC supper   Infusions:  . sodium chloride      Labs: Recent Labs    03/13/19 0210 03/15/19 0648  NA 138 140  K 3.4* 4.4  CL 102 108  CO2 26 25  GLUCOSE 113* 146*  BUN 14 9  CREATININE 0.59* 0.60*  CALCIUM 8.8* 9.0   No results for input(s): AST, ALT, ALKPHOS, BILITOT, PROT, ALBUMIN in the last 72 hours. Recent Labs    03/14/19 0056 03/15/19 0648  WBC 12.1* 12.2*  HGB 13.7 13.5  HCT 42.0 42.4  MCV 84.2 85.0  PLT 366 351   No results for input(s): CKTOTAL, CKMB, TROPONINI in the last 72 hours. Invalid input(s): POCBNP No results for input(s): HGBA1C in the last 72 hours.   Weights: [employer]  03/14/19 0733 03/14/19 0815 03/14/19 1348  Weight: 96.8 kg 102.1 kg 100.3 kg     Radiology/Studies:  DG Chest 2 View  Result Date: 03/12/2019 CLINICAL DATA:  54 year old male with left side chest pain radiating to the back in both arms for 2 days. No improvement with nitroglycerin. EXAM:  CHEST - 2 VIEW COMPARISON:  CT chest 12/21/2018 and earlier. FINDINGS: Mediastinal contours remain normal. Visualized tracheal air column is within normal limits. Stable lung volumes. Coarse bilateral increased pulmonary interstitial markings have not significantly changed since 2015. No pneumothorax, pulmonary edema, pleural effusion or acute pulmonary opacity. No acute osseous abnormality identified. Negative visible bowel gas pattern. IMPRESSION: No acute cardiopulmonary abnormality. Electronically Signed   By: Genevie Ann M.D.   On: 03/12/2019 19:44   CARDIAC CATHETERIZATION  Result Date: 03/14/2019  Prox Cx to Mid Cx lesion is 100% stenosed.  Prox LAD to Mid LAD lesion is 25% stenosed with 30% stenosed side branch in 1st Sept.  Ost LM to Mid LM lesion is 30% stenosed.  Mid RCA lesion is 20% stenosed.  54 year old male with hypertension hyperlipidemia and tobacco abuse having acute non-ST elevation myocardial infarction Left ventricle showing normal LV systolic function with no evidence of hypokinesis and ejection fraction of 50% Occlusion of proximal to mid circumflex affecting first obtuse marginal with collateralization from circumflex Plan Continue medical management of risk factors including beta-blocker ACE inhibitor Dual antiplatelet therapy for 12 months High intensity cholesterol therapy Discontinuation of tobacco abuse Cardiac rehabilitation   CARDIAC CATHETERIZATION  Result Date: 03/14/2019  Mid Cx lesion is 100% stenosed. CTO  Conclusion Unsuccessful attempt at PCI and stent of mid circumflex CTO Unable to cross with multiple wire attempts   DG Hand Complete Left  Result Date: 03/06/2019 CLINICAL DATA:  Injury with table saw EXAM: LEFT HAND - COMPLETE 3+ VIEW COMPARISON:  None. FINDINGS: Frontal, oblique, and lateral views obtained. There are foci of soft tissue injury to the distal second and third digits. No radiopaque foreign body. No fracture or dislocation. Joint spaces appear  normal. A small calcification adjacent to the trapezium bone is probably of arthropathic etiology. No erosive change. IMPRESSION: Soft tissue injuries to the distal second and third digits. No fracture or dislocation. No joint space narrowing or erosion. Electronically Signed   By: Lowella Grip III M.D.   On: 03/06/2019 18:57   ECHOCARDIOGRAM COMPLETE  Result Date: 03/13/2019   ECHOCARDIOGRAM REPORT   Patient Name:   Tim Anderson Date of Exam: 03/13/2019 Medical Rec #:  [id number]            Height:       67.0 in Accession #:    [id number]           Weight:       219.4 lb Date of Birth:  1965/03/26             BSA:          2.10 m Patient Age:    89 years             BP:           148/91 mmHg Patient Gender: M                    HR:           73 bpm. Exam Location:  [facility] Procedure: 2D Echo, Color Doppler, Cardiac Doppler and Intracardiac  Opacification Agent Indications:     R07.9 Chest Pain  History:         Patient has no prior history of Echocardiogram examinations.                  Stroke and COPD; Risk Factors:Hypertension and Dyslipidemia.  Sonographer:     Charmayne Sheer RDCS (AE) Referring Phys:  [id number] Athena Masse Diagnosing Phys: Serafina Royals MD IMPRESSIONS  1. Left ventricular ejection fraction, by visual estimation, is 55 to 60%. The left ventricle has normal function. There is no left ventricular hypertrophy.  2. Definity contrast agent was given IV to delineate the left ventricular endocardial borders.  3. The left ventricle has no regional wall motion abnormalities.  4. Global right ventricle has normal systolic function.The right ventricular size is normal. No increase in right ventricular wall thickness.  5. Left atrial size was normal.  6. Right atrial size was normal.  7. The mitral valve is normal in structure. Trivial mitral valve regurgitation.  8. The tricuspid valve is normal in structure.  9. The tricuspid valve is normal in structure. Tricuspid valve regurgitation  is trivial. 10. The aortic valve is normal in structure. Aortic valve regurgitation is trivial. 11. The pulmonic valve was normal in structure. Pulmonic valve regurgitation is not visualized. FINDINGS  Left Ventricle: Left ventricular ejection fraction, by visual estimation, is 55 to 60%. The left ventricle has normal function. Definity contrast agent was given IV to delineate the left ventricular endocardial borders. The left ventricle has no regional wall motion abnormalities. There is no left ventricular hypertrophy. Right Ventricle: The right ventricular size is normal. No increase in right ventricular wall thickness. Global RV systolic function is has normal systolic function. Left Atrium: Left atrial size was normal in size. Right Atrium: Right atrial size was normal in size Pericardium: There is no evidence of pericardial effusion. Mitral Valve: The mitral valve is normal in structure. Trivial mitral valve regurgitation. MV peak gradient, 4.2 mmHg. Tricuspid Valve: The tricuspid valve is normal in structure. Tricuspid valve regurgitation is trivial. Aortic Valve: The aortic valve is normal in structure. Aortic valve regurgitation is trivial. Aortic valve mean gradient measures 3.0 mmHg. Aortic valve peak gradient measures 5.4 mmHg. Aortic valve area, by VTI measures 4.03 cm. Pulmonic Valve: The pulmonic valve was normal in structure. Pulmonic valve regurgitation is not visualized. Pulmonic regurgitation is not visualized. Aorta: The aortic root, ascending aorta and aortic arch are all structurally normal, with no evidence of dilitation or obstruction. IAS/Shunts: No atrial level shunt detected by color flow Doppler.  LEFT VENTRICLE PLAX 2D LVIDd:         4.72 cm       Diastology LVIDs:         3.73 cm       LV e' lateral:   8.59 cm/s LV PW:         0.93 cm       LV E/e' lateral: 7.7 LV IVS:        0.74 cm       LV e' medial:    7.83 cm/s LVOT diam:     2.40 cm       LV E/e' medial:  8.5 LV SV:         44 ml  LV SV Index:   20.11 LVOT Area:     4.52 cm  LV Volumes (MOD) LV area d, A2C:    39.20 cm LV area d, A4C:  31.90 cm LV area s, A2C:    22.80 cm LV area s, A4C:    22.00 cm LV major d, A2C:   9.48 cm LV major d, A4C:   8.44 cm LV major s, A2C:   7.81 cm LV major s, A4C:   7.32 cm LV vol d, MOD A2C: 136.0 ml LV vol d, MOD A4C: 99.0 ml LV vol s, MOD A2C: 56.6 ml LV vol s, MOD A4C: 55.5 ml LV SV MOD A2C:     79.4 ml LV SV MOD A4C:     99.0 ml LV SV MOD BP:      65.0 ml RIGHT VENTRICLE RV Basal diam:  2.96 cm LEFT ATRIUM             Index       RIGHT ATRIUM           Index LA diam:        3.20 cm 1.52 cm/m  RA Area:     14.90 cm LA Vol (A2C):   42.9 ml 20.40 ml/m RA Volume:   35.20 ml  16.74 ml/m LA Vol (A4C):   21.6 ml 10.27 ml/m LA Biplane Vol: 31.2 ml 14.83 ml/m  AORTIC VALVE                   PULMONIC VALVE AV Area (Vmax):    4.02 cm    PV Vmax:       0.96 m/s AV Area (Vmean):   3.31 cm    PV Vmean:      68.000 cm/s AV Area (VTI):     4.03 cm    PV VTI:        0.186 m AV Vmax:           116.00 cm/s PV Peak grad:  3.7 mmHg AV Vmean:          88.500 cm/s PV Mean grad:  2.0 mmHg AV VTI:            0.220 m AV Peak Grad:      5.4 mmHg AV Mean Grad:      3.0 mmHg LVOT Vmax:         103.00 cm/s LVOT Vmean:        64.800 cm/s LVOT VTI:          0.196 m LVOT/AV VTI ratio: 0.89  AORTA Ao Root diam: 3.20 cm MITRAL VALVE MV Area (PHT): 3.77 cm             SHUNTS MV Peak grad:  4.2 mmHg             Systemic VTI:  0.20 m MV Mean grad:  2.0 mmHg             Systemic Diam: 2.40 cm MV Vmax:       1.03 m/s MV Vmean:      60.6 cm/s MV VTI:        0.24 m MV PHT:        58.29 msec MV Decel Time: 201 msec MV E velocity: 66.50 cm/s 103 cm/s MV A velocity: 88.80 cm/s 70.3 cm/s MV E/A ratio:  0.75       1.5  Serafina Royals MD Electronically signed by Serafina Royals MD Signature Date/Time: 03/13/2019/12:25:43 PM    Final      Assessment and Recommendation  54 y.o. male with known tobacco abuse hypertension hyperlipidemia  having acute non-ST elevation myocardial infarction with occluded circumflex with  recanalization and collateralization to the obtuse marginal with an attempt for PCI total occlusion which was unsuccessful but patient tolerating medication management for treatment of collateral blood flow and myocardial infarction and doing well appropriately with no need for further intervention today 1.  Continue medication management of non-ST elevation myocardial infarction including beta-blocker ACE inhibitor without change today 2.  High intensity cholesterol therapy 3.  Dual antiplatelet therapy 4.  Cardiac rehabilitation 5.  If ambulating well with appropriate medication management including isosorbide and no evidence of chest discomfort throughout this a.m. would discharge to home from cardiac standpoint with follow-up in the next 1 to 2 weeks for further adjustments of medication management  Signed, Serafina Royals M.D. FACC

## 2019-03-15 NOTE — Discharge Instructions (Signed)
Angina  Angina is extreme discomfort in the chest, neck, arm, jaw, or back. The discomfort is caused by a lack of blood in the middle layer of the heart wall (myocardium). There are four types of angina:  Stable angina. This is triggered by vigorous activity or exercise. It goes away when you rest or take angina medicine.  Unstable angina. This is a warning sign and can lead to a heart attack (acute coronary syndrome). This is a medical emergency. Symptoms come at rest and last a long time.  Microvascular angina. This affects the small coronary arteries. Symptoms include feeling tired and being short of breath.  Prinzmetal or variant angina. This is caused by a tightening (spasm) of the arteries that go to your heart. What are the causes? This condition is caused by atherosclerosis. This is the buildup of fat and cholesterol (plaque) in your arteries. The plaque may narrow or block the artery. Other causes of angina include:  Sudden tightening of the muscles of the arteries in the heart (coronary spasm).  Small artery disease (microvascular dysfunction).  Problems with any of your heart valves (heart valve disease).  A tear in an artery in your heart (coronary artery dissection).  Diseases of the heart muscle (cardiomyopathy), or other heart diseases. What increases the risk? You are more likely to develop this condition if you have:  High cholesterol.  High blood pressure (hypertension).  Diabetes.  A family history of heart disease.  An inactive (sedentary) lifestyle, or you do not exercise enough.  Depression.  Had radiation treatment to the left side of your chest. Other risk factors include:  Using tobacco.  Being obese.  Eating a diet high in saturated fats.  Being exposed to high stress or triggers of stress.  Using drugs, such as cocaine. Women have a greater risk for angina if:  They are older than 55.  They have gone through menopause (are  postmenopausal). What are the signs or symptoms? Common symptoms of this condition in both men and women may include:  Chest pain, which may: ? Feel like a crushing or squeezing in the chest, or like a tightness, pressure, fullness, or heaviness in the chest. ? Last for more than a few minutes at a time, or it may stop and come back (recur) over the course of a few minutes.  Pain in the neck, arm, jaw, or back.  Unexplained heartburn or indigestion.  Shortness of breath.  Nausea.  Sudden cold sweats. Women and people with diabetes may have unusual (atypical) symptoms, such as:  Fatigue.  Unexplained feelings of nervousness or anxiety.  Unexplained weakness.  Dizziness or fainting. How is this diagnosed? This condition may be diagnosed based on:  Your symptoms and medical history.  Electrocardiogram (ECG) to measure the electrical activity in your heart.  Blood tests.  Stress test to look for signs of blockage when your heart is stressed.  CT angiogram to examine your heart and the blood flow to it.  Coronary angiogram to check your coronary arteries for blockage. How is this treated? Angina may be treated with:  Medicines to: ? Prevent blood clots and heart attack. ? Relax blood vessels and improve blood flow to the heart (nitrates). ? Reduce blood pressure, improve the pumping action of the heart, and relax blood vessels that are spasming. ? Reduce cholesterol and help treat atherosclerosis.  A procedure to widen a narrowed or blocked coronary artery (angioplasty). A mesh tube may be placed in a coronary artery to   keep it open (coronary stenting).  Surgery to allow blood to go around a blocked artery (coronary artery bypass surgery). Follow these instructions at home: Medicines  Take over-the-counter and prescription medicines only as told by your health care provider.  Do not take the following medicines unless your health care provider approves: ? NSAIDs,  such as ibuprofen or naproxen. ? Vitamin supplements that contain vitamin A, vitamin E, or both. ? Hormone replacement therapy that contains estrogen with or without progestin. Eating and drinking   Eat a heart-healthy diet. This includes plenty of fresh fruits and vegetables, whole grains, low-fat (lean) protein, and low-fat dairy products.  Follow instructions from your health care provider about eating or drinking restrictions. Activity  Follow an exercise program approved by your health care provider.  Consider joining a cardiac rehabilitation program.  Take a break when you feel fatigued. Plan rest periods in your daily activities. Lifestyle   Do not use any products that contain nicotine or tobacco, such as cigarettes, e-cigarettes, and chewing tobacco. If you need help quitting, ask your health care provider.  If your health care provider says you can drink alcohol: ? Limit how much you use to:  0-1 drink a day for nonpregnant women.  0-2 drinks a day for men. ? Be aware of how much alcohol is in your drink. In the [place] one drink equals one 12 oz bottle of beer (355 mL), one 5 oz glass of wine (148 mL), or one 1 oz glass of hard liquor (44 mL). General instructions  Maintain a healthy weight.  Learn to manage stress.  Keep your vaccinations up to date. Get the flu (influenza) vaccine every year.  Talk to your health care provider if you feel depressed. Take a depression screening test to see if you are at risk for depression.  Work with your health care provider to manage other health conditions, such as hypertension or diabetes.  Keep all follow-up visits as told by your health care provider. This is important. Get help right away if:  You have pain in your chest, neck, arm, jaw, or back, and the pain: ? Lasts more than a few minutes. ? Is recurring. ? Is not relieved by taking medicines under the tongue (sublingual nitroglycerin). ? Increases in intensity or  frequency.  You have a lot of sweating without cause.  You have unexplained: ? Heartburn or indigestion. ? Shortness of breath or difficulty breathing. ? Nausea or vomiting. ? Fatigue. ? Feelings of nervousness or anxiety. ? Weakness.  You have sudden light-headedness or dizziness.  You faint. These symptoms may represent a serious problem that is an emergency. Do not wait to see if the symptoms will go away. Get medical help right away. Call your local emergency services (911 in the [place] Do not drive yourself to the hospital. Summary  Angina is extreme discomfort in the chest, neck, arm, jaw, or back that is caused by a lack of blood in the heart wall.  There are many symptoms of angina. They include chest pain, unexplained heartburn or indigestion, sudden cold sweats, and fatigue.  Angina may be treated with behavioral changes, medicine, or surgery.  Symptoms of angina may represent an emergency. Get medical help right away. Call your local emergency services (911 in the [place] Do not drive yourself to the hospital. This information is not intended to replace advice given to you by your health care provider. Make sure you discuss any questions you have with your health care provider.   Document Revised: 09/19/2017 Document Reviewed: 09/19/2017 [employer]vier Patient Education  2020 [employer]  

## 2019-03-15 NOTE — Discharge Summary (Signed)
Physician Discharge Summary  Tim Anderson [id number] DOB: [birth year] DOA: 03/12/2019  PCP: Steele Sizer, MD  Admit date: 03/12/2019 Discharge date: 03/15/2019  Admitted From: Home Disposition: Home  Recommendations for Outpatient Follow-up:  1. Follow up with PCP in 1-2 weeks 2. Follow-up with cardiology as directed  Home Health: No Equipment/Devices: None Discharge Condition: Stable CODE STATUS: Full Diet recommendation: Heart Healthy  Brief/Interim Summary:  Tim Anderson a 54 y.o.malewith medical history significant forCOPD, hypertension, diastolic heart failure, rheumatoid arthritis and obstructive sleep apnea family history of MI in father,history of positive stress test in11/2018 for preop clearance,who presents to the emergency room with a 3-day history of sided chest pain.Marland Kitchen He describes it as a mild to moderate intensity aching pain radiating across his chest and down bilateral arms, neck and shoulder blades,no aggravating or alleviating factors. Took nitroglycerin and did not help. Also use muscle rubs without improvement.ED stated he believed it is related to muscle strain following acarpentry incident with a saw4 days ago in which he sustained laceration of the left second and third fingers requiring an ER visit. Patient was advised to come in to the ER for evaluation of chest pain by his primary care provider. On arrival, he was chest pain-free. He denies associated nausea vomiting, diaphoresis, palpitations. Did have brieflightheadedness.Denies cough, fever chills or shortness of breath.  1/27: Patient seen and examined after cardiac catheterization.  PCI unsuccessful.  Cardiology consult note.  Recommending medical management optimization from a cardiac standpoint.  Patient stable.  Chest pain-free.  Heparin GTT discontinued.  1/28: Seen and examined.  Chest pain-free available.  Ambulated prior to discharge.  Discharged home in stable  condition with instructions to follow-up with cardiology within 1 to 2 weeks for medication management and titration.  Prescriptions confirmed and provided for dual antiplatelet therapy, beta-blocker, ACE inhibitor, high intensity statin, as needed nitroglycerin   Discharge Diagnoses:  Principal Problem:   NSTEMI (non-ST elevated myocardial infarction) ([place]) Active Problems:   Benign prostatic hyperplasia   Chronic obstructive pulmonary disease (HCC)   Dyslipidemia   HTN (hypertension)   Obstructive sleep apnea syndrome   Rheumatoid arthritis involving multiple joints (HCC)   Grade II diastolic dysfunction   MI, acute, non ST segment elevation (HCC)   NSTEMI Chest pain Coronary artery disease Status post cardiac catheterization PCI attempt unsuccessful Cardiology recommending medical management Beta-blocker, ACE inhibitor High intensity statin Dual antiplatelet therapy Cardiac rehabilitation Discharge home with instructions to follow-up with cardiology within 1 to 2 weeks  Hypertension Continue beta-blocker and ACE inhibitor as above  COPD Respiratory status stable Order Trelegy inhaler  Headache Resolved at this time  Sleep apnea Nocturnal CPAP  BPH Flomax  Rheumatoid arthritis Plaquenil  Discharge Instructions  Discharge Instructions    AMB Referral to Cardiac Rehabilitation - Phase II   Complete by: As directed    Diagnosis: PTCA   After initial evaluation and assessments completed: Virtual Based Care may be provided alone or in conjunction with Phase 2 Cardiac Rehab based on patient barriers.: Yes   Diet - low sodium heart healthy   Complete by: As directed    Increase activity slowly   Complete by: As directed      Allergies as of 03/15/2019      Reactions   Ciprofloxacin Other (See Comments)   Body aches   Crestor [rosuvastatin Calcium] Other (See Comments)   Muscles aches   Droperidol Other (See Comments)   Heart stopped   Lipofen   [fenofibrate]  muscle aches muscle aches   Niacin Er    flushed      Medication List    TAKE these medications   amLODipine-benazepril 5-20 MG capsule Commonly known as: LOTREL Take 1 capsule by mouth daily. What changed: when to take this   aspirin EC 81 MG tablet Take 81 mg by mouth. Reported on 07/29/2015   atorvastatin 80 MG tablet Commonly known as: LIPITOR Take 1 tablet (80 mg total) by mouth daily at 6 PM.   carvedilol 12.5 MG tablet Commonly known as: COREG Take 1 tablet (12.5 mg total) by mouth 2 (two) times daily with a meal.   clopidogrel 75 MG tablet Commonly known as: PLAVIX Take 1 tablet (75 mg total) by mouth daily with breakfast. Start taking on: March 16, 2019   hydroxychloroquine 200 MG tablet Commonly known as: PLAQUENIL Take 400 tablets by mouth at bedtime.   isosorbide mononitrate 30 MG 24 hr tablet Commonly known as: IMDUR Take 1 tablet (30 mg total) by mouth daily.   ketorolac 10 MG tablet Commonly known as: TORADOL TAKE 1 TABLET BY MOUTH EVERY 6 HOURS AS NEEDED   nitroGLYCERIN 0.4 MG SL tablet Commonly known as: NITROSTAT Place 1 tablet (0.4 mg total) under the tongue every 5 (five) minutes x 3 doses as needed for chest pain.   omeprazole 20 MG capsule Commonly known as: PRILOSEC Take 1 capsule (20 mg total) by mouth daily.   promethazine 12.5 MG tablet Commonly known as: PHENERGAN Take 1 tablet (12.5 mg total) by mouth every 8 (eight) hours as needed for nausea or vomiting.   rizatriptan 10 MG tablet Commonly known as: MAXALT Take 1 tablet (10 mg total) by mouth as needed for migraine.   tamsulosin 0.4 MG Caps capsule Commonly known as: FLOMAX Take 1 capsule (0.4 mg total) by mouth daily.   TRELEGY ELLIPTA IN Inhale 1 puff into the lungs daily.      Follow-up Information    Corey Skains, MD. Schedule an appointment as soon as possible for a visit in 1 week(s).   Specialty: Cardiology Contact information: [address] [facility] [place] [address] [phone]        [facility] Cardiac and Pulmonary Rehab Follow up.   Specialty: Cardiac Rehabilitation Why: Your Cardiologist has referred you to outpatient Cardiac Rehab at [facility].  The Cardiac Rehab department will contact you in one to two weeks after discharge to schedule your first appointment.   Contact information: [place] [id number] [place] [address] [phone]         Allergies  Allergen Reactions  . Ciprofloxacin Other (See Comments)    Body aches  . Crestor [Rosuvastatin Calcium] Other (See Comments)    Muscles aches  . Droperidol Other (See Comments)    Heart stopped  . Lipofen  [Fenofibrate]     muscle aches muscle aches  . Niacin Er     flushed    Consultations:  Cardiology-[facility]   Procedures/Studies: DG Chest 2 View  Result Date: 03/12/2019 CLINICAL DATA:  54 year old male with left side chest pain radiating to the back in both arms for 2 days. No improvement with nitroglycerin. EXAM: CHEST - 2 VIEW COMPARISON:  CT chest 12/21/2018 and earlier. FINDINGS: Mediastinal contours remain normal. Visualized tracheal air column is within normal limits. Stable lung volumes. Coarse bilateral increased pulmonary interstitial markings have not significantly changed since 2015. No pneumothorax, pulmonary edema, pleural effusion or acute pulmonary opacity. No acute  osseous abnormality identified. Negative visible bowel gas pattern. IMPRESSION: No acute cardiopulmonary abnormality. Electronically Signed   By: Genevie Ann M.D.   On: 03/12/2019 19:44   CARDIAC CATHETERIZATION  Result Date: 03/14/2019  Prox Cx to Mid Cx lesion is 100% stenosed.  Prox LAD to Mid LAD lesion is 25% stenosed with 30% stenosed side branch in 1st Sept.  Ost LM to Mid LM lesion is 30% stenosed.  Mid RCA lesion is 20% stenosed.  54 year old male with hypertension hyperlipidemia and tobacco abuse  having acute non-ST elevation myocardial infarction Left ventricle showing normal LV systolic function with no evidence of hypokinesis and ejection fraction of 50% Occlusion of proximal to mid circumflex affecting first obtuse marginal with collateralization from circumflex Plan Continue medical management of risk factors including beta-blocker ACE inhibitor Dual antiplatelet therapy for 12 months High intensity cholesterol therapy Discontinuation of tobacco abuse Cardiac rehabilitation   CARDIAC CATHETERIZATION  Result Date: 03/14/2019  Mid Cx lesion is 100% stenosed. CTO  Conclusion Unsuccessful attempt at PCI and stent of mid circumflex CTO Unable to cross with multiple wire attempts   DG Hand Complete Left  Result Date: 03/06/2019 CLINICAL DATA:  Injury with table saw EXAM: LEFT HAND - COMPLETE 3+ VIEW COMPARISON:  None. FINDINGS: Frontal, oblique, and lateral views obtained. There are foci of soft tissue injury to the distal second and third digits. No radiopaque foreign body. No fracture or dislocation. Joint spaces appear normal. A small calcification adjacent to the trapezium bone is probably of arthropathic etiology. No erosive change. IMPRESSION: Soft tissue injuries to the distal second and third digits. No fracture or dislocation. No joint space narrowing or erosion. Electronically Signed   By: Lowella Grip III M.D.   On: 03/06/2019 18:57   ECHOCARDIOGRAM COMPLETE  Result Date: 03/13/2019   ECHOCARDIOGRAM REPORT   Patient Name:   Tim Anderson Date of Exam: 03/13/2019 Medical Rec #:  [id number]            Height:       67.0 in Accession #:    [id number]           Weight:       219.4 lb Date of Birth:  1965-11-15             BSA:          2.10 m Patient Age:    66 years             BP:           148/91 mmHg Patient Gender: M                    HR:           73 bpm. Exam Location:  [facility] Procedure: 2D Echo, Color Doppler, Cardiac Doppler and Intracardiac            Opacification Agent  Indications:     R07.9 Chest Pain  History:         Patient has no prior history of Echocardiogram examinations.                  Stroke and COPD; Risk Factors:Hypertension and Dyslipidemia.  Sonographer:     Charmayne Sheer RDCS (AE) Referring Phys:  [id number] Athena Masse Diagnosing Phys: Serafina Royals MD IMPRESSIONS  1. Left ventricular ejection fraction, by visual estimation, is 55 to 60%. The left ventricle has normal function. There is no left ventricular hypertrophy.  2. Definity  contrast agent was given IV to delineate the left ventricular endocardial borders.  3. The left ventricle has no regional wall motion abnormalities.  4. Global right ventricle has normal systolic function.The right ventricular size is normal. No increase in right ventricular wall thickness.  5. Left atrial size was normal.  6. Right atrial size was normal.  7. The mitral valve is normal in structure. Trivial mitral valve regurgitation.  8. The tricuspid valve is normal in structure.  9. The tricuspid valve is normal in structure. Tricuspid valve regurgitation is trivial. 10. The aortic valve is normal in structure. Aortic valve regurgitation is trivial. 11. The pulmonic valve was normal in structure. Pulmonic valve regurgitation is not visualized. FINDINGS  Left Ventricle: Left ventricular ejection fraction, by visual estimation, is 55 to 60%. The left ventricle has normal function. Definity contrast agent was given IV to delineate the left ventricular endocardial borders. The left ventricle has no regional wall motion abnormalities. There is no left ventricular hypertrophy. Right Ventricle: The right ventricular size is normal. No increase in right ventricular wall thickness. Global RV systolic function is has normal systolic function. Left Atrium: Left atrial size was normal in size. Right Atrium: Right atrial size was normal in size Pericardium: There is no evidence of pericardial effusion. Mitral Valve: The mitral valve is normal in  structure. Trivial mitral valve regurgitation. MV peak gradient, 4.2 mmHg. Tricuspid Valve: The tricuspid valve is normal in structure. Tricuspid valve regurgitation is trivial. Aortic Valve: The aortic valve is normal in structure. Aortic valve regurgitation is trivial. Aortic valve mean gradient measures 3.0 mmHg. Aortic valve peak gradient measures 5.4 mmHg. Aortic valve area, by VTI measures 4.03 cm. Pulmonic Valve: The pulmonic valve was normal in structure. Pulmonic valve regurgitation is not visualized. Pulmonic regurgitation is not visualized. Aorta: The aortic root, ascending aorta and aortic arch are all structurally normal, with no evidence of dilitation or obstruction. IAS/Shunts: No atrial level shunt detected by color flow Doppler.  LEFT VENTRICLE PLAX 2D LVIDd:         4.72 cm       Diastology LVIDs:         3.73 cm       LV e' lateral:   8.59 cm/s LV PW:         0.93 cm       LV E/e' lateral: 7.7 LV IVS:        0.74 cm       LV e' medial:    7.83 cm/s LVOT diam:     2.40 cm       LV E/e' medial:  8.5 LV SV:         44 ml LV SV Index:   20.11 LVOT Area:     4.52 cm  LV Volumes (MOD) LV area d, A2C:    39.20 cm LV area d, A4C:    31.90 cm LV area s, A2C:    22.80 cm LV area s, A4C:    22.00 cm LV major d, A2C:   9.48 cm LV major d, A4C:   8.44 cm LV major s, A2C:   7.81 cm LV major s, A4C:   7.32 cm LV vol d, MOD A2C: 136.0 ml LV vol d, MOD A4C: 99.0 ml LV vol s, MOD A2C: 56.6 ml LV vol s, MOD A4C: 55.5 ml LV SV MOD A2C:     79.4 ml LV SV MOD A4C:     99.0 ml LV SV  MOD BP:      65.0 ml RIGHT VENTRICLE RV Basal diam:  2.96 cm LEFT ATRIUM             Index       RIGHT ATRIUM           Index LA diam:        3.20 cm 1.52 cm/m  RA Area:     14.90 cm LA Vol (A2C):   42.9 ml 20.40 ml/m RA Volume:   35.20 ml  16.74 ml/m LA Vol (A4C):   21.6 ml 10.27 ml/m LA Biplane Vol: 31.2 ml 14.83 ml/m  AORTIC VALVE                   PULMONIC VALVE AV Area (Vmax):    4.02 cm    PV Vmax:       0.96 m/s AV Area  (Vmean):   3.31 cm    PV Vmean:      68.000 cm/s AV Area (VTI):     4.03 cm    PV VTI:        0.186 m AV Vmax:           116.00 cm/s PV Peak grad:  3.7 mmHg AV Vmean:          88.500 cm/s PV Mean grad:  2.0 mmHg AV VTI:            0.220 m AV Peak Grad:      5.4 mmHg AV Mean Grad:      3.0 mmHg LVOT Vmax:         103.00 cm/s LVOT Vmean:        64.800 cm/s LVOT VTI:          0.196 m LVOT/AV VTI ratio: 0.89  AORTA Ao Root diam: 3.20 cm MITRAL VALVE MV Area (PHT): 3.77 cm             SHUNTS MV Peak grad:  4.2 mmHg             Systemic VTI:  0.20 m MV Mean grad:  2.0 mmHg             Systemic Diam: 2.40 cm MV Vmax:       1.03 m/s MV Vmean:      60.6 cm/s MV VTI:        0.24 m MV PHT:        58.29 msec MV Decel Time: 201 msec MV E velocity: 66.50 cm/s 103 cm/s MV A velocity: 88.80 cm/s 70.3 cm/s MV E/A ratio:  0.75       1.5  Serafina Royals MD Electronically signed by Serafina Royals MD Signature Date/Time: 03/13/2019/12:25:43 PM    Final     (Echo, Carotid, EGD, Colonoscopy, ERCP)    Subjective: Seen and examined on the day of discharge No acute interval events No new complaints Chest pain-free Ambulating without issue  Discharge Exam: Vitals:   03/15/19 0409 03/15/19 0726  BP: 132/75 (!) 154/79  Pulse: 80 68  Resp:  15  Temp: 97.8 F (36.6 C) 97.8 F (36.6 C)  SpO2: 97% 99%   Vitals:   03/14/19 1750 03/14/19 1912 03/15/19 0409 03/15/19 0726  BP: 120/72 129/78 132/75 (!) 154/79  Pulse: 76 77 80 68  Resp:    15  Temp:  98 F (36.7 C) 97.8 F (36.6 C) 97.8 F (36.6 C)  TempSrc:  Oral Oral Oral  SpO2:  98% 97% 99%  Weight:      Height:        General: Pt is alert, awake, not in acute distress Cardiovascular: RRR, S1/S2 +, no rubs, no gallops Respiratory: CTA bilaterally, no wheezing, no rhonchi Abdominal: Soft, NT, ND, bowel sounds + Extremities: no edema, no cyanosis    The results of significant diagnostics from this hospitalization (including imaging, microbiology,  ancillary and laboratory) are listed below for reference.     Microbiology: Recent Results (from the past 240 hour(s))  Respiratory Panel by RT PCR (Flu A&B, Covid) - Nasopharyngeal Swab     Status: None   Collection Time: 03/12/19 10:07 PM   Specimen: Nasopharyngeal Swab  Result Value Ref Range Status   SARS Coronavirus 2 by RT PCR NEGATIVE NEGATIVE Final    Comment: (NOTE) SARS-CoV-2 target nucleic acids are NOT DETECTED. The SARS-CoV-2 RNA is generally detectable in upper respiratoy specimens during the acute phase of infection. The lowest concentration of SARS-CoV-2 viral copies this assay can detect is 131 copies/mL. A negative result does not preclude SARS-Cov-2 infection and should not be used as the sole basis for treatment or other patient management decisions. A negative result may occur with  improper specimen collection/handling, submission of specimen other than nasopharyngeal swab, presence of viral mutation(s) within the areas targeted by this assay, and inadequate number of viral copies (<131 copies/mL). A negative result must be combined with clinical observations, patient history, and epidemiological information. The expected result is Negative. Fact Sheet for Patients:  PinkCheek.be Fact Sheet for Healthcare Providers:  GravelBags.it This test is not yet ap proved or cleared by the [place] FDA and  has been authorized for detection and/or diagnosis of SARS-CoV-2 by FDA under an Emergency Use Authorization (EUA). This EUA will remain  in effect (meaning this test can be used) for the duration of the COVID-19 declaration under Section 564(b)(1) of the Act, 21 U.S.C. section 360bbb-3(b)(1), unless the authorization is terminated or revoked sooner.    Influenza A by PCR NEGATIVE NEGATIVE Final   Influenza B by PCR NEGATIVE NEGATIVE Final    Comment: (NOTE) The Xpert Xpress SARS-CoV-2/FLU/RSV assay is  intended as an aid in  the diagnosis of influenza from Nasopharyngeal swab specimens and  should not be used as a sole basis for treatment. Nasal washings and  aspirates are unacceptable for Xpert Xpress SARS-CoV-2/FLU/RSV  testing. Fact Sheet for Patients: PinkCheek.be Fact Sheet for Healthcare Providers: GravelBags.it This test is not yet approved or cleared by the [place] FDA and  has been authorized for detection and/or diagnosis of SARS-CoV-2 by  FDA under an Emergency Use Authorization (EUA). This EUA will remain  in effect (meaning this test can be used) for the duration of the  Covid-19 declaration under Section 564(b)(1) of the Act, 21  U.S.C. section 360bbb-3(b)(1), unless the authorization is  terminated or revoked. Performed at [facility], [address] [place], [place] [address]      Labs: BNP (last 3 results) No results for input(s): BNP in the last 8760 hours. Basic Metabolic Panel: Recent Labs  Lab 03/12/19 1928 03/13/19 0210 03/15/19 0648  NA 137 138 140  K 3.7 3.4* 4.4  CL 101 102 108  CO2 26 26 25   GLUCOSE 98 113* 146*  BUN 11 14 9   CREATININE 0.61 0.59* 0.60*  CALCIUM 9.2 8.8* 9.0   Liver Function Tests: No results for input(s): AST, ALT, ALKPHOS, BILITOT, PROT, ALBUMIN in the last 168 hours. No results for input(s):  LIPASE, AMYLASE in the last 168 hours. No results for input(s): AMMONIA in the last 168 hours. CBC: Recent Labs  Lab 03/12/19 1928 03/13/19 0210 03/14/19 0056 03/15/19 0648  WBC 17.6* 13.3* 12.1* 12.2*  HGB 13.7 12.8* 13.7 13.5  HCT 42.1 39.3 42.0 42.4  MCV 83.9 83.1 84.2 85.0  PLT 387 365 366 351   Cardiac Enzymes: No results for input(s): CKTOTAL, CKMB, CKMBINDEX, TROPONINI in the last 168 hours. BNP: Invalid input(s): POCBNP CBG: No results for input(s): GLUCAP in the last 168 hours. D-Dimer No results for input(s): DDIMER in the last 72  hours. Hgb A1c No results for input(s): HGBA1C in the last 72 hours. Lipid Profile Recent Labs    03/13/19 0210  CHOL 164  HDL 26*  LDLCALC 88  TRIG 248*  CHOLHDL 6.3   Thyroid function studies No results for input(s): TSH, T4TOTAL, T3FREE, THYROIDAB in the last 72 hours.  Invalid input(s): FREET3 Anemia work up No results for input(s): VITAMINB12, FOLATE, FERRITIN, TIBC, IRON, RETICCTPCT in the last 72 hours. Urinalysis    Component Value Date/Time   COLORURINE YELLOW (A) 11/14/2017 1920   APPEARANCEUR Clear 11/23/2017 1612   LABSPEC 1.010 11/14/2017 1920   LABSPEC 1.010 10/23/2012 1319   PHURINE 5.0 11/14/2017 1920   GLUCOSEU Negative 11/23/2017 1612   GLUCOSEU Negative 10/23/2012 1319   HGBUR NEGATIVE 11/14/2017 1920   BILIRUBINUR Negative 11/23/2017 1612   BILIRUBINUR Negative 10/23/2012 1319   KETONESUR NEGATIVE 11/14/2017 1920   PROTEINUR Negative 11/23/2017 1612   PROTEINUR NEGATIVE 11/14/2017 1920   UROBILINOGEN negative (A) 11/14/2017 1521   NITRITE Negative 11/23/2017 1612   NITRITE NEGATIVE 11/14/2017 1920   LEUKOCYTESUR Negative 11/23/2017 1612   LEUKOCYTESUR Negative 10/23/2012 1319   Sepsis Labs Invalid input(s): PROCALCITONIN,  WBC,  LACTICIDVEN Microbiology Recent Results (from the past 240 hour(s))  Respiratory Panel by RT PCR (Flu A&B, Covid) - Nasopharyngeal Swab     Status: None   Collection Time: 03/12/19 10:07 PM   Specimen: Nasopharyngeal Swab  Result Value Ref Range Status   SARS Coronavirus 2 by RT PCR NEGATIVE NEGATIVE Final    Comment: (NOTE) SARS-CoV-2 target nucleic acids are NOT DETECTED. The SARS-CoV-2 RNA is generally detectable in upper respiratoy specimens during the acute phase of infection. The lowest concentration of SARS-CoV-2 viral copies this assay can detect is 131 copies/mL. A negative result does not preclude SARS-Cov-2 infection and should not be used as the sole basis for treatment or other patient management  decisions. A negative result may occur with  improper specimen collection/handling, submission of specimen other than nasopharyngeal swab, presence of viral mutation(s) within the areas targeted by this assay, and inadequate number of viral copies (<131 copies/mL). A negative result must be combined with clinical observations, patient history, and epidemiological information. The expected result is Negative. Fact Sheet for Patients:  PinkCheek.be Fact Sheet for Healthcare Providers:  GravelBags.it This test is not yet ap proved or cleared by the [place] FDA and  has been authorized for detection and/or diagnosis of SARS-CoV-2 by FDA under an Emergency Use Authorization (EUA). This EUA will remain  in effect (meaning this test can be used) for the duration of the COVID-19 declaration under Section 564(b)(1) of the Act, 21 U.S.C. section 360bbb-3(b)(1), unless the authorization is terminated or revoked sooner.    Influenza A by PCR NEGATIVE NEGATIVE Final   Influenza B by PCR NEGATIVE NEGATIVE Final    Comment: (NOTE) The Xpert Xpress SARS-CoV-2/FLU/RSV assay is intended  as an aid in  the diagnosis of influenza from Nasopharyngeal swab specimens and  should not be used as a sole basis for treatment. Nasal washings and  aspirates are unacceptable for Xpert Xpress SARS-CoV-2/FLU/RSV  testing. Fact Sheet for Patients: PinkCheek.be Fact Sheet for Healthcare Providers: GravelBags.it This test is not yet approved or cleared by the [place] FDA and  has been authorized for detection and/or diagnosis of SARS-CoV-2 by  FDA under an Emergency Use Authorization (EUA). This EUA will remain  in effect (meaning this test can be used) for the duration of the  Covid-19 declaration under Section 564(b)(1) of the Act, 21  U.S.C. section 360bbb-3(b)(1), unless the authorization  is  terminated or revoked. Performed at [facility], [address] [place], [place] [address]      Time coordinating discharge: Over 30 minutes  SIGNED:   Sidney Ace, MD  [facility] 03/15/2019, 10:43 AM Pager   If 7PM-7AM, please contact night-coverage [email or web address] Password [email or web address]

## 2019-03-22 ENCOUNTER — Other Ambulatory Visit: Payer: Self-pay

## 2019-03-22 ENCOUNTER — Encounter: Payer: Self-pay | Admitting: Family Medicine

## 2019-03-22 ENCOUNTER — Ambulatory Visit (INDEPENDENT_AMBULATORY_CARE_PROVIDER_SITE_OTHER): Payer: Self-pay | Admitting: Family Medicine

## 2019-03-22 VITALS — BP 130/70 | HR 97 | Temp 96.8°F | Resp 16 | Ht 68.0 in | Wt 221.9 lb

## 2019-03-22 DIAGNOSIS — R609 Edema, unspecified: Secondary | ICD-10-CM

## 2019-03-22 DIAGNOSIS — I252 Old myocardial infarction: Secondary | ICD-10-CM

## 2019-03-22 DIAGNOSIS — I10 Essential (primary) hypertension: Secondary | ICD-10-CM

## 2019-03-22 DIAGNOSIS — Z09 Encounter for follow-up examination after completed treatment for conditions other than malignant neoplasm: Secondary | ICD-10-CM

## 2019-03-22 DIAGNOSIS — D72828 Other elevated white blood cell count: Secondary | ICD-10-CM

## 2019-03-22 MED ORDER — AMOXICILLIN-POT CLAVULANATE 875-125 MG PO TABS: 1.0000 | ORAL_TABLET | Freq: Two times a day (BID) | ORAL | 0 refills | Status: DC

## 2019-03-22 NOTE — Progress Notes (Signed)
Name: Tim Anderson   MRN: [id number]    DOB: Aug 01, 1965   Date:03/25/2019       Progress Note  Subjective  Chief Complaint  Chief Complaint  Patient presents with  . Hospitalization Follow-up    Has no insurance referred to Cardiologist however, has not seen them yet  . Code STEMI    Went to [facility] on Monday January 25 had 3 blockages-  . Fatigue  . Facial Swelling    Onset-past saturday, left side of his face everytime he eats. Would like Dr. Ancil Boozer to look at it.    HPI  NSTEMI: he states he was at work on 01/23 and bent forward to pick up some wood and felt a pain across his atnerior chest and down both arms. He put the wood down , he took baby aspirin when the pain did not resolve within minutes. He took NTG and did not help with symptoms, he states after second dose he noticed some improvement. He kept working and noticed pain upper back, so he went home. He used topical medication on his back and went to sleep, he woke up the following and was feeling a little better, went to work on Monday and wife called our office, we advised him to go to [facility], but he went to see a cardiologist in [place] first , EKG was off and he was told to go to [facility]. He drove back to [place] and went to [facility]. He was found to have NSTEMI, he went home on higher dose of Atorvastatin , he is back on Plavix and is also on Imdur now. He states since discharge chest pain resolved, he has been back to work and feeling well.   Laceration finger: on 03/06/2019, he had stiches placed and did not back to have it removed, we will do it today . Tdap is up to date   Atherosclerosis aorta: taking statin therapy and plavix now  Parotiditis: he states it is a recurrent problems, flaring up since the past weekend, trying to increase fluids, swells up when he eats and goes down afterwards. He is trying sour candy. It gets painful when swollen    Patient Active Problem List   Diagnosis Date Noted  . History of non-ST elevation  myocardial infarction (NSTEMI) 03/12/2019  . 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 Atherosclerosis of aorta ([place]) 06/09/2018  . Mediastinal lymphadenopathy 06/09/2018  . Lung nodules 06/09/2018  . Arthralgia of left temporomandibular joint 12/08/2017  . Mucopurulent chronic bronchitis ([place]) 12/08/2017  . Mild left ventricular systolic dysfunction [id number]  . Grade II diastolic dysfunction [id number]  . Mild mitral regurgitation 07/05/2017  . Mild tricuspid regurgitation 07/05/2017  . Essential hemorrhagic thrombocythemia ([place]) 05/10/2017  . History of left hip replacement 12/27/2016  . Angina pectoris ([place]) 10/27/2016  . Carpal tunnel syndrome on both sides 04/07/2016  . Primary osteoarthritis involving multiple joints 07/29/2015  . Arthritis of knee, degenerative 02/03/2015  . Benign prostatic hyperplasia 09/28/2014  . Chronic obstructive pulmonary disease ([place]) 09/28/2014  . Drug abuse ([place]) 09/28/2014  . Deflected nasal septum 09/28/2014  . Dyslipidemia 09/28/2014  . Dysmetabolic syndrome [id number]  . GERD (gastroesophageal reflux disease) 09/28/2014  . H/O renal calculi 09/28/2014  . HTN (hypertension) 09/28/2014  . Male hypogonadism 09/28/2014  . Failure of erection 09/28/2014  . Obstructive sleep apnea syndrome 09/28/2014  . Depression with anxiety 09/28/2014  . Obesity (BMI 30-39.9) 09/28/2014  . Migraine without aura and responsive to treatment  09/28/2014  . Perennial allergic rhinitis with seasonal variation 09/28/2014  . Elevated platelet count 09/28/2014  . Tobacco abuse 09/28/2014  . Cerebrovascular accident (CVA) ([place]) 07/27/2013  . History of TIA (transient ischemic attack) 09/20/2011  . Rheumatoid arthritis involving multiple joints ([place]) 07/31/2009    Past Surgical History:  Procedure Laterality Date  . CORONARY BALLOON ANGIOPLASTY N/A 03/14/2019   Procedure: CORONARY BALLOON ANGIOPLASTY;  Surgeon: Yolonda Kida, MD;  Location: [facility];  Service: Cardiovascular;  Laterality: N/A;  . FACIAL RECONSTRUCTION SURGERY    . JOINT REPLACEMENT Left    hip  . JOINT REPLACEMENT Right    knee  . KNEE SURGERY Right   . LEFT HEART CATH AND CORONARY ANGIOGRAPHY N/A 03/14/2019   Procedure: LEFT HEART CATH AND CORONARY ANGIOGRAPHY;  Surgeon: Corey Skains, MD;  Location: [facility];  Service: Cardiovascular;  Laterality: N/A;    Family History  Problem Relation Age of Onset  . Diabetes Mother   . Diabetes Father   . Heart attack Father   . Cancer Brother        Skin and Prostate-Oldest Brother  . Diabetes Brother      Current Outpatient Medications:  .  amLODipine-benazepril (LOTREL) 5-20 MG capsule, Take 1 capsule by mouth daily. (Patient taking differently: Take 1 capsule by mouth at bedtime. ), Disp: 90 capsule, Rfl: 1 .  aspirin EC 81 MG tablet, Take 81 mg by mouth. Reported on 07/29/2015, Disp: , Rfl:  .  atorvastatin (LIPITOR) 80 MG tablet, Take 1 tablet (80 mg total) by mouth daily at 6 PM., Disp: 30 tablet, Rfl: 0 .  carvedilol (COREG) 12.5 MG tablet, Take 1 tablet (12.5 mg total) by mouth 2 (two) times daily with a meal., Disp: 180 tablet, Rfl: 1 .  clopidogrel (PLAVIX) 75 MG tablet, Take 1 tablet (75 mg total) by mouth daily with breakfast., Disp: 30 tablet, Rfl: 0 .  Fluticasone-Umeclidin-Vilant (TRELEGY ELLIPTA IN), Inhale 1 puff into the lungs daily., Disp: , Rfl:  .  hydroxychloroquine (PLAQUENIL) 200 MG tablet, Take 400 tablets by mouth at bedtime. , Disp: , Rfl:  .  isosorbide mononitrate (IMDUR) 30 MG 24 hr tablet, Take 1 tablet (30 mg total) by mouth daily., Disp: 30 tablet, Rfl: 0 .  ketorolac (TORADOL) 10 MG tablet, TAKE 1 TABLET BY MOUTH EVERY 6 HOURS AS NEEDED, Disp: 30 tablet, Rfl: 0 .  nitroGLYCERIN (NITROSTAT) 0.4 MG SL tablet, Place 1 tablet (0.4 mg total) under the tongue every 5 (five) minutes x 3 doses as needed for chest pain., Disp: 100 tablet,  Rfl: 0 .  omeprazole (PRILOSEC) 20 MG capsule, Take 1 capsule (20 mg total) by mouth daily., Disp: 90 capsule, Rfl: 1 .  promethazine (PHENERGAN) 12.5 MG tablet, Take 1 tablet (12.5 mg total) by mouth every 8 (eight) hours as needed for nausea or vomiting., Disp: 20 tablet, Rfl: 0 .  rizatriptan (MAXALT) 10 MG tablet, Take 1 tablet (10 mg total) by mouth as needed for migraine., Disp: 10 tablet, Rfl: 0 .  sildenafil (REVATIO) 20 MG tablet, Take 20 mg by mouth 3 (three) times daily., Disp: , Rfl:  .  tamsulosin (FLOMAX) 0.4 MG CAPS capsule, Take 1 capsule (0.4 mg total) by mouth daily., Disp: 30 capsule, Rfl: 3 .  warfarin (COUMADIN) 2 MG tablet, Take 2 mg by mouth daily., Disp: , Rfl:  .  amoxicillin-clavulanate (AUGMENTIN) 875-125 MG tablet, Take 1 tablet by mouth 2 (two) times daily.,  Disp: 20 tablet, Rfl: 0  Allergies  Allergen Reactions  . Ciprofloxacin Other (See Comments)    Body aches  . Crestor [Rosuvastatin Calcium] Other (See Comments)    Muscles aches  . Droperidol Other (See Comments)    Heart stopped  . Lipofen  [Fenofibrate]     muscle aches muscle aches  . Niacin Er     flushed    I personally reviewed active problem list, medication list, allergies, family history, social history, health maintenance with the patient/caregiver today.   ROS  Constitutional: Negative for fever or weight change.  Respiratory: Negative for cough and shortness of breath.   Cardiovascular: Negative for chest pain or palpitations.  Gastrointestinal: Negative for abdominal pain, no bowel changes.  Musculoskeletal: Negative for gait problem or joint swelling.  Skin: Negative for rash.  Neurological: Negative for dizziness or headache.  No other specific complaints in a complete review of systems (except as listed in HPI above).  Objective  Vitals:   03/22/19 1042  BP: 130/70  Pulse: 97  Resp: 16  Temp: (!) 96.8 F (36 C)  TempSrc: Temporal  SpO2: 98%  Weight: 221 lb 14.4 oz  (100.7 kg)  Height: 5\' 8"  (1.727 m)    Body mass index is 33.74 kg/m.  Physical Exam  Constitutional: Patient appears well-developed and well-nourished. Obese  No distress.  HEENT: head atraumatic, normocephalic, pupils equal and reactive to light Cardiovascular: Normal rate, regular rhythm and normal heart sounds.  No murmur heard. No BLE edema. Pulmonary/Chest: Effort normal and breath sounds normal. No respiratory distress. Abdominal: Soft.  There is no tenderness. Fingers: index and 3rd fingers, with laceration, stiches are deep, we will remove it today  Psychiatric: Patient has a normal mood and affect. behavior is normal. Judgment and thought content normal.  Recent Results (from the past 2160 hour(s))  SARS CORONAVIRUS 2 (TAT 6-24 HRS) Nasopharyngeal Nasopharyngeal Swab     Status: None   Collection Time: 01/01/19  8:19 AM   Specimen: Nasopharyngeal Swab  Result Value Ref Range   SARS Coronavirus 2 NEGATIVE NEGATIVE    Comment: (NOTE) SARS-CoV-2 target nucleic acids are NOT DETECTED. The SARS-CoV-2 RNA is generally detectable in upper and lower respiratory specimens during the acute phase of infection. Negative results do not preclude SARS-CoV-2 infection, do not rule out co-infections with other pathogens, and should not be used as the sole basis for treatment or other patient management decisions. Negative results must be combined with clinical observations, patient history, and epidemiological information. The expected result is Negative. Fact Sheet for Patients: SugarRoll.be Fact Sheet for Healthcare Providers: https://www.woods-mathews.com/ This test is not yet approved or cleared by the [place] FDA and  has been authorized for detection and/or diagnosis of SARS-CoV-2 by FDA under an Emergency Use Authorization (EUA). This EUA will remain  in effect (meaning this test can be used) for the duration of the COVID-19  declaration under Section 56 4(b)(1) of the Act, 21 U.S.C. section 360bbb-3(b)(1), unless the authorization is terminated or revoked sooner. Performed at [facility], [place] [address] [place], [place] [id number]   Basic metabolic panel     Status: None   Collection Time: 03/12/19  7:28 PM  Result Value Ref Range   Sodium 137 135 - 145 mmol/L   Potassium 3.7 3.5 - 5.1 mmol/L   Chloride 101 98 - 111 mmol/L   CO2 26 22 - 32 mmol/L   Glucose, Bld 98 70 - 99 mg/dL  BUN 11 6 - 20 mg/dL   Creatinine, Ser 0.61 0.61 - 1.24 mg/dL   Calcium 9.2 8.9 - 10.3 mg/dL   GFR calc non Af Amer >60 >60 mL/min   GFR calc Af Amer >60 >60 mL/min   Anion gap 10 5 - 15    Comment: Performed at [facility], [address] [place], [place] [address]  CBC     Status: Abnormal   Collection Time: 03/12/19  7:28 PM  Result Value Ref Range   WBC 17.6 (H) 4.0 - 10.5 K/uL   RBC 5.02 4.22 - 5.81 MIL/uL   Hemoglobin 13.7 13.0 - 17.0 g/dL   HCT 42.1 39.0 - 52.0 %   MCV 83.9 80.0 - 100.0 fL   MCH 27.3 26.0 - 34.0 pg   MCHC 32.5 30.0 - 36.0 g/dL   RDW 12.6 11.5 - 15.5 %   Platelets 387 150 - 400 K/uL   nRBC 0.0 0.0 - 0.2 %    Comment: Performed at [facility], [address] [place], [place] [address]  Troponin I (High Sensitivity)     Status: Abnormal   Collection Time: 03/12/19  7:28 PM  Result Value Ref Range   Troponin I (High Sensitivity) 539 (HH) <18 ng/L    Comment: CRITICAL RESULT CALLED TO, READ BACK BY AND VERIFIED WITH [facility] AT 2011 03/12/2019  TFK (NOTE) Elevated high sensitivity troponin I (hsTnI) values and significant  changes across serial measurements may suggest ACS but many other  chronic and acute conditions are known to elevate hsTnI results.  Refer to the "Links" section for chest pain algorithms and additional  guidance. Performed at [facility], [address] [place], [place] [address]   Respiratory Panel by RT PCR (Flu A&B, Covid)  - Nasopharyngeal Swab     Status: None   Collection Time: 03/12/19 10:07 PM   Specimen: Nasopharyngeal Swab  Result Value Ref Range   SARS Coronavirus 2 by RT PCR NEGATIVE NEGATIVE    Comment: (NOTE) SARS-CoV-2 target nucleic acids are NOT DETECTED. The SARS-CoV-2 RNA is generally detectable in upper respiratoy specimens during the acute phase of infection. The lowest concentration of SARS-CoV-2 viral copies this assay can detect is 131 copies/mL. A negative result does not preclude SARS-Cov-2 infection and should not be used as the sole basis for treatment or other patient management decisions. A negative result may occur with  improper specimen collection/handling, submission of specimen other than nasopharyngeal swab, presence of viral mutation(s) within the areas targeted by this assay, and inadequate number of viral copies (<131 copies/mL). A negative result must be combined with clinical observations, patient history, and epidemiological information. The expected result is Negative. Fact Sheet for Patients:  PinkCheek.be Fact Sheet for Healthcare Providers:  GravelBags.it This test is not yet ap proved or cleared by the [place] FDA and  has been authorized for detection and/or diagnosis of SARS-CoV-2 by FDA under an Emergency Use Authorization (EUA). This EUA will remain  in effect (meaning this test can be used) for the duration of the COVID-19 declaration under Section 564(b)(1) of the Act, 21 U.S.C. section 360bbb-3(b)(1), unless the authorization is terminated or revoked sooner.    Influenza A by PCR NEGATIVE NEGATIVE   Influenza B by PCR NEGATIVE NEGATIVE    Comment: (NOTE) The Xpert Xpress SARS-CoV-2/FLU/RSV assay is intended as an aid in  the diagnosis of influenza from Nasopharyngeal swab specimens and  should not be used as a sole  basis for treatment. Nasal washings and  aspirates are unacceptable for  Xpert Xpress SARS-CoV-2/FLU/RSV  testing. Fact Sheet for Patients: PinkCheek.be Fact Sheet for Healthcare Providers: GravelBags.it This test is not yet approved or cleared by the [place] FDA and  has been authorized for detection and/or diagnosis of SARS-CoV-2 by  FDA under an Emergency Use Authorization (EUA). This EUA will remain  in effect (meaning this test can be used) for the duration of the  Covid-19 declaration under Section 564(b)(1) of the Act, 21  U.S.C. section 360bbb-3(b)(1), unless the authorization is  terminated or revoked. Performed at [facility], [address] [place], [place] [address]   HIV Antibody (routine testing w rflx)     Status: None   Collection Time: 03/12/19 10:07 PM  Result Value Ref Range   HIV Screen 4th Generation wRfx NON REACTIVE NON REACTIVE    Comment: Performed at [facility] [address] [place], [place] [address]  Troponin I (High Sensitivity)     Status: Abnormal   Collection Time: 03/12/19 10:07 PM  Result Value Ref Range   Troponin I (High Sensitivity) 422 (HH) <18 ng/L    Comment: CRITICAL VALUE NOTED. VALUE IS CONSISTENT WITH PREVIOUSLY REPORTED/CALLED VALUE MJU (NOTE) Elevated high sensitivity troponin I (hsTnI) values and significant  changes across serial measurements may suggest ACS but many other  chronic and acute conditions are known to elevate hsTnI results.  Refer to the "Links" section for chest pain algorithms and additional  guidance. Performed at [facility], [address] [place], [place] [address]   Protime-INR     Status: None   Collection Time: 03/12/19 10:07 PM  Result Value Ref Range   Prothrombin Time 13.2 11.4 - 15.2 seconds   INR 1.0 0.8 - 1.2    Comment: (NOTE) INR goal varies based on device and disease states. Performed at [facility], [address] [place], [place] [address]   APTT      Status: None   Collection Time: 03/12/19 10:07 PM  Result Value Ref Range   aPTT 26 24 - 36 seconds    Comment: Performed at [facility], [address] [place], [place] [id number]  Basic metabolic panel     Status: Abnormal   Collection Time: 03/13/19  2:10 AM  Result Value Ref Range   Sodium 138 135 - 145 mmol/L   Potassium 3.4 (L) 3.5 - 5.1 mmol/L   Chloride 102 98 - 111 mmol/L   CO2 26 22 - 32 mmol/L   Glucose, Bld 113 (H) 70 - 99 mg/dL   BUN 14 6 - 20 mg/dL   Creatinine, Ser 0.59 (L) 0.61 - 1.24 mg/dL   Calcium 8.8 (L) 8.9 - 10.3 mg/dL   GFR calc non Af Amer >60 >60 mL/min   GFR calc Af Amer >60 >60 mL/min   Anion gap 10 5 - 15    Comment: Performed at [facility], [address] [place], [place] [address]  Lipid panel     Status: Abnormal   Collection Time: 03/13/19  2:10 AM  Result Value Ref Range   Cholesterol 164 0 - 200 mg/dL   Triglycerides 248 (H) <150 mg/dL   HDL 26 (L) >40 mg/dL   Total CHOL/HDL Ratio 6.3 RATIO   VLDL 50 (H) 0 - 40 mg/dL   LDL Cholesterol 88 0 - 99 mg/dL    Comment:        Total Cholesterol/HDL:CHD  Risk Coronary Heart Disease Risk Table                     Men   Women  1/2 Average Risk   3.4   3.3  Average Risk       5.0   4.4  2 X Average Risk   9.6   7.1  3 X Average Risk  23.4   11.0        Use the calculated Patient Ratio above and the CHD Risk Table to determine the patient's CHD Risk.        ATP III CLASSIFICATION (LDL):  <100     mg/dL   Optimal  100-129  mg/dL   Near or Above                    Optimal  130-159  mg/dL   Borderline  160-189  mg/dL   High  >190     mg/dL   Very High Performed at [facility], [address] [place], [place] [address]   CBC     Status: Abnormal   Collection Time: 03/13/19  2:10 AM  Result Value Ref Range   WBC 13.3 (H) 4.0 - 10.5 K/uL   RBC 4.73 4.22 - 5.81 MIL/uL   Hemoglobin 12.8 (L) 13.0 - 17.0 g/dL   HCT 39.3 39.0 - 52.0 %   MCV 83.1 80.0 - 100.0 fL    MCH 27.1 26.0 - 34.0 pg   MCHC 32.6 30.0 - 36.0 g/dL   RDW 12.7 11.5 - 15.5 %   Platelets 365 150 - 400 K/uL   nRBC 0.0 0.0 - 0.2 %    Comment: Performed at [facility], [place], [place] [address]  Heparin level (unfractionated)     Status: Abnormal   Collection Time: 03/13/19  2:10 AM  Result Value Ref Range   Heparin Unfractionated 0.10 (L) 0.30 - 0.70 IU/mL    Comment: (NOTE) If heparin results are below expected values, and patient dosage has  been confirmed, suggest follow up testing of antithrombin III levels. Performed at [facility], [address], [place], [place] [address]   Heparin level (unfractionated)     Status: Abnormal   Collection Time: 03/13/19 10:17 AM  Result Value Ref Range   Heparin Unfractionated 0.16 (L) 0.30 - 0.70 IU/mL    Comment: (NOTE) If heparin results are below expected values, and patient dosage has  been confirmed, suggest follow up testing of antithrombin III levels. Performed at [facility], [address] [place], [place] [address]   ECHOCARDIOGRAM COMPLETE     Status: None   Collection Time: 03/13/19 12:16 PM  Result Value Ref Range   Weight 3,509.72 oz   Height 67 in   BP 148/91 mmHg  Heparin level (unfractionated)     Status: None   Collection Time: 03/13/19  6:21 PM  Result Value Ref Range   Heparin Unfractionated 0.30 0.30 - 0.70 IU/mL    Comment: (NOTE) If heparin results are below expected values, and patient dosage has  been confirmed, suggest follow up testing of antithrombin III levels. Performed at [facility], [address] [place],  [address]   Heparin level (unfractionated)     Status: Abnormal   Collection Time: 03/14/19 12:56 AM  Result Value Ref Range   Heparin Unfractionated 0.27 (L) 0.30 - 0.70 IU/mL    Comment: (NOTE) If heparin results are  below expected values, and patient dosage has  been confirmed, suggest follow up testing of  antithrombin III levels. Performed at [facility], [address] [place], [place] [address]   CBC     Status: Abnormal   Collection Time: 03/14/19 12:56 AM  Result Value Ref Range   WBC 12.1 (H) 4.0 - 10.5 K/uL   RBC 4.99 4.22 - 5.81 MIL/uL   Hemoglobin 13.7 13.0 - 17.0 g/dL   HCT 42.0 39.0 - 52.0 %   MCV 84.2 80.0 - 100.0 fL   MCH 27.5 26.0 - 34.0 pg   MCHC 32.6 30.0 - 36.0 g/dL   RDW 12.7 11.5 - 15.5 %   Platelets 366 150 - 400 K/uL   nRBC 0.0 0.0 - 0.2 %    Comment: Performed at [facility], [address] [place], [place] [address]  POCT Activated clotting time     Status: None   Collection Time: 03/14/19  9:49 AM  Result Value Ref Range   Activated Clotting Time 367 seconds  CBC     Status: Abnormal   Collection Time: 03/15/19  6:48 AM  Result Value Ref Range   WBC 12.2 (H) 4.0 - 10.5 K/uL   RBC 4.99 4.22 - 5.81 MIL/uL   Hemoglobin 13.5 13.0 - 17.0 g/dL   HCT 42.4 39.0 - 52.0 %   MCV 85.0 80.0 - 100.0 fL   MCH 27.1 26.0 - 34.0 pg   MCHC 31.8 30.0 - 36.0 g/dL   RDW 12.8 11.5 - 15.5 %   Platelets 351 150 - 400 K/uL   nRBC 0.0 0.0 - 0.2 %    Comment: Performed at [facility], [address] [place], [place] [id number]  Basic metabolic panel     Status: Abnormal   Collection Time: 03/15/19  6:48 AM  Result Value Ref Range   Sodium 140 135 - 145 mmol/L   Potassium 4.4 3.5 - 5.1 mmol/L   Chloride 108 98 - 111 mmol/L   CO2 25 22 - 32 mmol/L   Glucose, Bld 146 (H) 70 - 99 mg/dL   BUN 9 6 - 20 mg/dL   Creatinine, Ser 0.60 (L) 0.61 - 1.24 mg/dL   Calcium 9.0 8.9 - 10.3 mg/dL   GFR calc non Af Amer >60 >60 mL/min   GFR calc Af Amer >60 >60 mL/min   Anion gap 7 5 - 15    Comment: Performed at [facility], [address] [place], [place] [address]     PHQ2/9: Depression screen [facility] 2/9 03/22/2019 10/10/2018 06/09/2018 12/08/2017 11/14/2017  Decreased Interest 2 0 0 0 0  Down, Depressed, Hopeless 0 0 0 0 0  PHQ - 2 Score 2 0 0  0 0  Altered sleeping 3 0 0 0 0  Tired, decreased energy 3 0 0 0 0  Change in appetite 2 0 0 0 0  Feeling bad or failure about yourself  1 0 0 0 0  Trouble concentrating 2 0 0 0 0  Moving slowly or fidgety/restless 0 0 0 0 0  Suicidal thoughts 0 0 0 0 0  PHQ-9 Score 13 0 0 0 0  Difficult doing work/chores Somewhat difficult Not difficult at all - Not difficult at all Not difficult at all    phq 9 is negative   Fall Risk: Fall Risk  03/22/2019 10/10/2018 06/09/2018 12/08/2017 11/14/2017  Falls in the past year? 0 1 1 No No  Number falls in  past yr: 0 1 1 - -  Injury with Fall? 0 0 1 - -  Risk for fall due to : - History of fall(s) - - -  Follow up - Falls evaluation completed - - -    Assessment & Plan  1. Hospital discharge follow-up   2. History of MI (myocardial infarction)  He has been taking medication as presdcribed  3. Essential hypertension  Slightly up   4. Other elevated white blood cell (WBC) count  Recheck   5. Parotid swelling  We will treat with antibiotics, lemon drops, lots of water - amoxicillin-clavulanate (AUGMENTIN) 875-125 MG tablet; Take 1 tablet by mouth 2 (two) times daily.  Dispense: 20 tablet; Refill: 0

## 2019-04-04 DIAGNOSIS — I2510 Atherosclerotic heart disease of native coronary artery without angina pectoris: Secondary | ICD-10-CM | POA: Insufficient documentation

## 2019-04-04 DIAGNOSIS — I214 Non-ST elevation (NSTEMI) myocardial infarction: Secondary | ICD-10-CM | POA: Insufficient documentation

## 2019-04-12 ENCOUNTER — Other Ambulatory Visit: Payer: Self-pay

## 2019-04-12 ENCOUNTER — Ambulatory Visit (INDEPENDENT_AMBULATORY_CARE_PROVIDER_SITE_OTHER): Payer: Self-pay | Admitting: Family Medicine

## 2019-04-12 ENCOUNTER — Encounter: Payer: Self-pay | Admitting: Family Medicine

## 2019-04-12 VITALS — BP 140/80 | HR 92 | Temp 97.3°F | Resp 16 | Ht 68.0 in | Wt 222.4 lb

## 2019-04-12 DIAGNOSIS — K219 Gastro-esophageal reflux disease without esophagitis: Secondary | ICD-10-CM

## 2019-04-12 DIAGNOSIS — G4733 Obstructive sleep apnea (adult) (pediatric): Secondary | ICD-10-CM

## 2019-04-12 DIAGNOSIS — E669 Obesity, unspecified: Secondary | ICD-10-CM

## 2019-04-12 DIAGNOSIS — M069 Rheumatoid arthritis, unspecified: Secondary | ICD-10-CM

## 2019-04-12 DIAGNOSIS — I700 Atherosclerosis of aorta: Secondary | ICD-10-CM

## 2019-04-12 DIAGNOSIS — E1169 Type 2 diabetes mellitus with other specified complication: Secondary | ICD-10-CM

## 2019-04-12 DIAGNOSIS — I252 Old myocardial infarction: Secondary | ICD-10-CM

## 2019-04-12 DIAGNOSIS — I209 Angina pectoris, unspecified: Secondary | ICD-10-CM

## 2019-04-12 DIAGNOSIS — G43009 Migraine without aura, not intractable, without status migrainosus: Secondary | ICD-10-CM

## 2019-04-12 DIAGNOSIS — J411 Mucopurulent chronic bronchitis: Secondary | ICD-10-CM

## 2019-04-12 DIAGNOSIS — E785 Hyperlipidemia, unspecified: Secondary | ICD-10-CM

## 2019-04-12 DIAGNOSIS — I10 Essential (primary) hypertension: Secondary | ICD-10-CM

## 2019-04-12 MED ORDER — OMEPRAZOLE 20 MG PO CPDR: 20.0000 mg | DELAYED_RELEASE_CAPSULE | Freq: Every day | ORAL | 1 refills | Status: DC

## 2019-04-12 MED ORDER — KETOROLAC TROMETHAMINE 10 MG PO TABS: 10.0000 mg | ORAL_TABLET | Freq: Four times a day (QID) | ORAL | 0 refills | Status: DC | PRN

## 2019-04-12 MED ORDER — CLOPIDOGREL BISULFATE 75 MG PO TABS: 75.0000 mg | ORAL_TABLET | Freq: Every day | ORAL | 5 refills | Status: AC

## 2019-04-12 MED ORDER — CARVEDILOL 25 MG PO TABS: 25.0000 mg | ORAL_TABLET | Freq: Two times a day (BID) | ORAL | 1 refills | Status: DC

## 2019-04-12 NOTE — Progress Notes (Signed)
Name: Tim Anderson   MRN: [id number]    DOB: October 22, 1965   Date:04/12/2019       Progress Note  Subjective  Chief Complaint  Chief Complaint  Patient presents with  . Diabetes  . Dyslipidemia  . Hypertension  . Migraine  . Gastroesophageal Reflux    HPI  Abnormal CT chest: order as a repeat for incidental findings on CT abdomen for renal evaluation. Discussed results with patient and recommend evaluation by pulmonologistduring his visit in April 2020, he saw Dr. Mortimer Fries on 06/20/2018, he was diagnosed with pulmonary fibrosis, COPD and advised to quit smoking. He is cutting down on smoking, currently down to half pack daily.   From 05/2018  IMPRESSION: 1. Solid 5 mm right lower lobe pulmonary nodule is stable since 11/14/2017 chest CT, probably benign. 2. Right middle lobe 6 mm solid pulmonary nodule, not previously imaged. 3. Nonspecific mild mediastinal and right hilar lymphadenopathy. 4. Nonspecific patchy subpleural ground-glass attenuation and reticulation in the lungs without bronchiectasis or honeycombing. Imaging appearance favors nonspecific smoking related fibrosis, however a developing interstitial lung disease including UIP cannot be excluded. 5. Suggest attention to all of these findings on a follow-up dedicated high-resolution chest CT study in 6 months. 6. Three-vessel coronary atherosclerosis.  From 12/2018   IMPRESSION: 1. Stable exam.  No new or progressive interval findings. 2. Stable appearance bilateral pulmonary nodules measuring up to 8 mm in the 6 months since prior study. Non-contrast chest CT at 12-18 months (from today's scan) is considered optional for low-risk patients, but is recommended for high-risk patients. This recommendation follows the consensus statement: Guidelines for Management of Incidental Pulmonary Nodules Detected on CT Images: From the Fleischner Society 2017; Radiology 2017; 284:228-243. 3. The mild mediastinal  lymphadenopathy is similar to prior. Attention on follow-up recommended. 4. Subpleural reticulation with areas of ground-glass attenuation in the lungs bilaterally. Potentially smoking related lung disease. As noted previously, early interstitial lung disease not excluded. 5.  Aortic Atherosclerois (ICD10-170.0)  Angina he states had a stress test in [place] to left hip surgery, last episode of chest pain was 2 months and took one dose of NTG and symptoms resolved, denies decrease in exercise tolerance. Three vessel coronary atherosclerosis on CT chest .He had NSTEMI 02/2019, he had a cardiac cath but stent was not placed, he is under the care of Dr. Nehemiah Massed, on high dose statin ( Atorvastatin ) and Plavix, since the heart attack he is still feeling very tired, right arms feels sore ( the one he had cath ) and also had one episode of chest pain that lasted 30 minutes when he took a dose of Viagra, advised him to stop viagra, may need a penile pump - he sees Urologist   GERD: he is on medication and symptoms are controlledhe has been taking medication daily, states when skipping symptoms returns.Unchanged   OSA: he has not been using CPAP machine anymore. He states he stopped about8years ago because he was feeling claustrophobic. He has tried a few times since heart attack in January but unable to sleep well with the full face mask   Migraine/headaches: he has dull ache/tension type headachedull frontal headacheat least once a week, he takes Toradol prn for that. No recent episodes of migraine   RA: sees Rheumatologist, off Enbrelon Plaquenilgiven by Dr. Jefm Bryant.He is now on hydrochloroquine  HTN:he denies recent episodes of chest pain or palpitation.BP has been elevated at home and at our office, we will adjust medication dose  DMII:  diagnosed Nov 2016, he is on diet only, gained a lot of weight since he quit smoking 11/2016, he resumed smoking last year but weight  is still up.Hehas noticedpolyphagia, polydipsia, polyuria..We will recheck hgbA1C today.  Hyperlipidemia: he is back on Lipitor since 02/2019 after MI   Patient Active Problem List   Diagnosis Date Noted  . Coronary artery disease involving native coronary artery of native heart 04/04/2019  . History of non-ST elevation myocardial infarction (NSTEMI) 03/12/2019  . 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 Atherosclerosis of aorta ([place]) 06/09/2018  . Mediastinal lymphadenopathy 06/09/2018  . Lung nodules 06/09/2018  . Arthralgia of left temporomandibular joint 12/08/2017  . Mucopurulent chronic bronchitis ([place]) 12/08/2017  . Mild left ventricular systolic dysfunction [id number]  . Grade II diastolic dysfunction [id number]  . Mild mitral regurgitation 07/05/2017  . Mild tricuspid regurgitation 07/05/2017  . 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 History of left hip replacement 12/27/2016  . Angina pectoris ([place]) 10/27/2016  . Carpal tunnel syndrome on both sides 04/07/2016  . Primary osteoarthritis involving multiple joints 07/29/2015  . Arthritis of knee, degenerative 02/03/2015  . Benign prostatic hyperplasia 09/28/2014  . Chronic obstructive pulmonary disease ([place]) 09/28/2014  . Drug abuse ([place]) 09/28/2014  . Deflected nasal septum 09/28/2014  . Dyslipidemia 09/28/2014  . Dysmetabolic syndrome [id number]  . GERD (gastroesophageal reflux disease) 09/28/2014  . H/O renal calculi 09/28/2014  . HTN (hypertension) 09/28/2014  . Male hypogonadism 09/28/2014  . Failure of erection 09/28/2014  . Obstructive sleep apnea syndrome 09/28/2014  . Depression with anxiety 09/28/2014  . Obesity (BMI 30-39.9) 09/28/2014  . Migraine without aura and responsive to treatment 09/28/2014  . Perennial allergic rhinitis with seasonal variation 09/28/2014  . Elevated platelet count 09/28/2014  . Tobacco abuse 09/28/2014  .  Cerebrovascular accident (CVA) ([place]) 07/27/2013  . History of TIA (transient ischemic attack) 09/20/2011  . Rheumatoid arthritis involving multiple joints ([place]) 07/31/2009    Past Surgical History:  Procedure Laterality Date  . CORONARY BALLOON ANGIOPLASTY N/A 03/14/2019   Procedure: CORONARY BALLOON ANGIOPLASTY;  Surgeon: Yolonda Kida, MD;  Location: [facility];  Service: Cardiovascular;  Laterality: N/A;  . FACIAL RECONSTRUCTION SURGERY    . JOINT REPLACEMENT Left    hip  . JOINT REPLACEMENT Right    knee  . KNEE SURGERY Right   . LEFT HEART CATH AND CORONARY ANGIOGRAPHY N/A 03/14/2019   Procedure: LEFT HEART CATH AND CORONARY ANGIOGRAPHY;  Surgeon: Corey Skains, MD;  Location: [facility];  Service: Cardiovascular;  Laterality: N/A;    Family History  Problem Relation Age of Onset  . Diabetes Mother   . Diabetes Father   . Heart attack Father   . Cancer Brother        Skin and Prostate-Oldest Brother  . Diabetes Brother     Social History   Tobacco Use  . Smoking status: Current Some Day Smoker    Packs/day: 1.00    Years: 30.00    Pack years: 30.00    Types: Cigarettes    Start date: 09/30/1976  . Smokeless tobacco: Never Used  Substance Use Topics  . Alcohol use: Yes    Alcohol/week: 4.0 standard drinks    Types: 4 Cans of beer per week    Comment: daily  . Drug use: Not Currently    Types: Marijuana    Comment: used to use cocaine past history     Current Outpatient Medications:  .  amLODipine-benazepril (LOTREL) 5-20 MG capsule, Take 1 capsule by mouth daily. (Patient taking differently: Take 1 capsule by mouth at bedtime. ), Disp: 90 capsule, Rfl: 1 .  aspirin EC 81 MG tablet, Take 81 mg by mouth. Reported on 07/29/2015, Disp: , Rfl:  .  atorvastatin (LIPITOR) 80 MG tablet, Take 1 tablet (80 mg total) by mouth daily at 6 PM., Disp: 30 tablet, Rfl: 0 .  carvedilol (COREG) 25 MG tablet, Take 1 tablet (25 mg total) by mouth 2 (two)  times daily with a meal., Disp: 180 tablet, Rfl: 1 .  clopidogrel (PLAVIX) 75 MG tablet, Take 1 tablet (75 mg total) by mouth daily with breakfast., Disp: 30 tablet, Rfl: 5 .  Fluticasone-Umeclidin-Vilant (TRELEGY ELLIPTA IN), Inhale 1 puff into the lungs daily., Disp: , Rfl:  .  hydroxychloroquine (PLAQUENIL) 200 MG tablet, Take 400 tablets by mouth at bedtime. , Disp: , Rfl:  .  isosorbide mononitrate (IMDUR) 30 MG 24 hr tablet, Take 1 tablet (30 mg total) by mouth daily., Disp: 30 tablet, Rfl: 0 .  nitroGLYCERIN (NITROSTAT) 0.4 MG SL tablet, Place 1 tablet (0.4 mg total) under the tongue every 5 (five) minutes x 3 doses as needed for chest pain., Disp: 100 tablet, Rfl: 0 .  omeprazole (PRILOSEC) 20 MG capsule, Take 1 capsule (20 mg total) by mouth daily., Disp: 90 capsule, Rfl: 1 .  promethazine (PHENERGAN) 12.5 MG tablet, Take 1 tablet (12.5 mg total) by mouth every 8 (eight) hours as needed for nausea or vomiting., Disp: 20 tablet, Rfl: 0 .  rizatriptan (MAXALT) 10 MG tablet, Take 1 tablet (10 mg total) by mouth as needed for migraine., Disp: 10 tablet, Rfl: 0 .  tamsulosin (FLOMAX) 0.4 MG CAPS capsule, Take 1 capsule (0.4 mg total) by mouth daily., Disp: 30 capsule, Rfl: 3 .  ketorolac (TORADOL) 10 MG tablet, Take 1 tablet (10 mg total) by mouth every 6 (six) hours as needed., Disp: 30 tablet, Rfl: 0  Allergies  Allergen Reactions  . Ciprofloxacin Other (See Comments)    Body aches  . Crestor [Rosuvastatin Calcium] Other (See Comments)    Muscles aches  . Droperidol Other (See Comments)    Heart stopped  . Lipofen  [Fenofibrate]     muscle aches muscle aches  . Niacin Er     flushed    I personally reviewed active problem list, medication list, allergies, family history, social history, health maintenance with the patient/caregiver today.   ROS  Constitutional: Negative for fever or weight change.  Respiratory: Positive  for cough and intermittent  shortness of breath.    Cardiovascular: Positive  for chest pain or palpitations.  Gastrointestinal: Negative for abdominal pain, no bowel changes.  Musculoskeletal: Positive  for gait problem or joint swelling.  Skin: Negative for rash.  Neurological: Negative for dizziness, positive for mild  headache.  No other specific complaints in a complete review of systems (except as listed in HPI above).  Objective  Vitals:   04/12/19 0816  BP: 140/80  Pulse: 92  Resp: 16  Temp: (!) 97.3 F (36.3 C)  TempSrc: Temporal  SpO2: 97%  Weight: 222 lb 6.4 oz (100.9 kg)  Height: 5\' 8"  (1.727 m)    Body mass index is 33.82 kg/m.  Physical Exam  Constitutional: Patient appears well-developed and well-nourished. Obese No distress.  HEENT: head atraumatic, normocephalic, pupils equal and reactive to light Cardiovascular: Normal rate, regular rhythm and normal heart sounds.  No murmur heard. No BLE edema.  Pulmonary/Chest: Effort normal and breath sounds normal. No respiratory distress. Abdominal: Soft.  There is no tenderness. Psychiatric: Patient has a normal mood and affect. behavior is normal. Judgment and thought content normal.  Recent Results (from the past 2160 hour(s))  Basic metabolic panel     Status: None   Collection Time: 03/12/19  7:28 PM  Result Value Ref Range   Sodium 137 135 - 145 mmol/L   Potassium 3.7 3.5 - 5.1 mmol/L   Chloride 101 98 - 111 mmol/L   CO2 26 22 - 32 mmol/L   Glucose, Bld 98 70 - 99 mg/dL   BUN 11 6 - 20 mg/dL   Creatinine, Ser 0.61 0.61 - 1.24 mg/dL   Calcium 9.2 8.9 - 10.3 mg/dL   GFR calc non Af Amer >60 >60 mL/min   GFR calc Af Amer >60 >60 mL/min   Anion gap 10 5 - 15    Comment: Performed at [facility], [address] [place], [place] [address]  CBC     Status: Abnormal   Collection Time: 03/12/19  7:28 PM  Result Value Ref Range   WBC 17.6 (H) 4.0 - 10.5 K/uL   RBC 5.02 4.22 - 5.81 MIL/uL   Hemoglobin 13.7 13.0 - 17.0 g/dL   HCT 42.1 39.0 - 52.0 %    MCV 83.9 80.0 - 100.0 fL   MCH 27.3 26.0 - 34.0 pg   MCHC 32.5 30.0 - 36.0 g/dL   RDW 12.6 11.5 - 15.5 %   Platelets 387 150 - 400 K/uL   nRBC 0.0 0.0 - 0.2 %    Comment: Performed at [facility], [address] [place], [place] [address]  Troponin I (High Sensitivity)     Status: Abnormal   Collection Time: 03/12/19  7:28 PM  Result Value Ref Range   Troponin I (High Sensitivity) 539 (HH) <18 ng/L    Comment: CRITICAL RESULT CALLED TO, READ BACK BY AND VERIFIED WITH [facility] AT 2011 03/12/2019  TFK (NOTE) Elevated high sensitivity troponin I (hsTnI) values and significant  changes across serial measurements may suggest ACS but many other  chronic and acute conditions are known to elevate hsTnI results.  Refer to the "Links" section for chest pain algorithms and additional  guidance. Performed at [facility], [address] [place], [place] [address]   Respiratory Panel by RT PCR (Flu A&B, Covid) - Nasopharyngeal Swab     Status: None   Collection Time: 03/12/19 10:07 PM   Specimen: Nasopharyngeal Swab  Result Value Ref Range   SARS Coronavirus 2 by RT PCR NEGATIVE NEGATIVE    Comment: (NOTE) SARS-CoV-2 target nucleic acids are NOT DETECTED. The SARS-CoV-2 RNA is generally detectable in upper respiratoy specimens during the acute phase of infection. The lowest concentration of SARS-CoV-2 viral copies this assay can detect is 131 copies/mL. A negative result does not preclude SARS-Cov-2 infection and should not be used as the sole basis for treatment or other patient management decisions. A negative result may occur with  improper specimen collection/handling, submission of specimen other than nasopharyngeal swab, presence of viral mutation(s) within the areas targeted by this assay, and inadequate number of viral copies (<131 copies/mL). A negative result must be combined with clinical observations, patient history, and epidemiological  information. The expected result is Negative. Fact Sheet for Patients:  PinkCheek.be Fact Sheet for Healthcare Providers:  GravelBags.it This test is not yet ap proved or cleared by the [place] FDA  and  has been authorized for detection and/or diagnosis of SARS-CoV-2 by FDA under an Emergency Use Authorization (EUA). This EUA will remain  in effect (meaning this test can be used) for the duration of the COVID-19 declaration under Section 564(b)(1) of the Act, 21 U.S.C. section 360bbb-3(b)(1), unless the authorization is terminated or revoked sooner.    Influenza A by PCR NEGATIVE NEGATIVE   Influenza B by PCR NEGATIVE NEGATIVE    Comment: (NOTE) The Xpert Xpress SARS-CoV-2/FLU/RSV assay is intended as an aid in  the diagnosis of influenza from Nasopharyngeal swab specimens and  should not be used as a sole basis for treatment. Nasal washings and  aspirates are unacceptable for Xpert Xpress SARS-CoV-2/FLU/RSV  testing. Fact Sheet for Patients: PinkCheek.be Fact Sheet for Healthcare Providers: GravelBags.it This test is not yet approved or cleared by the [place] FDA and  has been authorized for detection and/or diagnosis of SARS-CoV-2 by  FDA under an Emergency Use Authorization (EUA). This EUA will remain  in effect (meaning this test can be used) for the duration of the  Covid-19 declaration under Section 564(b)(1) of the Act, 21  U.S.C. section 360bbb-3(b)(1), unless the authorization is  terminated or revoked. Performed at [facility], [address] [place], [place] [address]   HIV Antibody (routine testing w rflx)     Status: None   Collection Time: 03/12/19 10:07 PM  Result Value Ref Range   HIV Screen 4th Generation wRfx NON REACTIVE NON REACTIVE    Comment: Performed at [facility] [address] [place], [place] [address]   Troponin I (High Sensitivity)     Status: Abnormal   Collection Time: 03/12/19 10:07 PM  Result Value Ref Range   Troponin I (High Sensitivity) 422 (HH) <18 ng/L    Comment: CRITICAL VALUE NOTED. VALUE IS CONSISTENT WITH PREVIOUSLY REPORTED/CALLED VALUE MJU (NOTE) Elevated high sensitivity troponin I (hsTnI) values and significant  changes across serial measurements may suggest ACS but many other  chronic and acute conditions are known to elevate hsTnI results.  Refer to the "Links" section for chest pain algorithms and additional  guidance. Performed at [facility], [address] [place], [place] [address]   Protime-INR     Status: None   Collection Time: 03/12/19 10:07 PM  Result Value Ref Range   Prothrombin Time 13.2 11.4 - 15.2 seconds   INR 1.0 0.8 - 1.2    Comment: (NOTE) INR goal varies based on device and disease states. Performed at [facility], [address] [place], [place] [address]   APTT     Status: None   Collection Time: 03/12/19 10:07 PM  Result Value Ref Range   aPTT 26 24 - 36 seconds    Comment: Performed at [facility], [address] [place], [place] [id number]  Basic metabolic panel     Status: Abnormal   Collection Time: 03/13/19  2:10 AM  Result Value Ref Range   Sodium 138 135 - 145 mmol/L   Potassium 3.4 (L) 3.5 - 5.1 mmol/L   Chloride 102 98 - 111 mmol/L   CO2 26 22 - 32 mmol/L   Glucose, Bld 113 (H) 70 - 99 mg/dL   BUN 14 6 - 20 mg/dL   Creatinine, Ser 0.59 (L) 0.61 - 1.24 mg/dL   Calcium 8.8 (L) 8.9 - 10.3 mg/dL   GFR calc non Af Amer >60 >60 mL/min   GFR calc Af Amer >60 >60  mL/min   Anion gap 10 5 - 15    Comment: Performed at [facility], [address] [place], [place] [address]  Lipid panel     Status: Abnormal   Collection Time: 03/13/19  2:10 AM  Result Value Ref Range   Cholesterol 164 0 - 200 mg/dL   Triglycerides 248 (H) <150 mg/dL   HDL 26 (L) >40 mg/dL   Total CHOL/HDL Ratio  6.3 RATIO   VLDL 50 (H) 0 - 40 mg/dL   LDL Cholesterol 88 0 - 99 mg/dL    Comment:        Total Cholesterol/HDL:CHD Risk Coronary Heart Disease Risk Table                     Men   Women  1/2 Average Risk   3.4   3.3  Average Risk       5.0   4.4  2 X Average Risk   9.6   7.1  3 X Average Risk  23.4   11.0        Use the calculated Patient Ratio above and the CHD Risk Table to determine the patient's CHD Risk.        ATP III CLASSIFICATION (LDL):  <100     mg/dL   Optimal  100-129  mg/dL   Near or Above                    Optimal  130-159  mg/dL   Borderline  160-189  mg/dL   High  >190     mg/dL   Very High Performed at [facility], [address] [place], [place] [address]   CBC     Status: Abnormal   Collection Time: 03/13/19  2:10 AM  Result Value Ref Range   WBC 13.3 (H) 4.0 - 10.5 K/uL   RBC 4.73 4.22 - 5.81 MIL/uL   Hemoglobin 12.8 (L) 13.0 - 17.0 g/dL   HCT 39.3 39.0 - 52.0 %   MCV 83.1 80.0 - 100.0 fL   MCH 27.1 26.0 - 34.0 pg   MCHC 32.6 30.0 - 36.0 g/dL   RDW 12.7 11.5 - 15.5 %   Platelets 365 150 - 400 K/uL   nRBC 0.0 0.0 - 0.2 %    Comment: Performed at [facility], [place], [place] [address]  Heparin level (unfractionated)     Status: Abnormal   Collection Time: 03/13/19  2:10 AM  Result Value Ref Range   Heparin Unfractionated 0.10 (L) 0.30 - 0.70 IU/mL    Comment: (NOTE) If heparin results are below expected values, and patient dosage has  been confirmed, suggest follow up testing of antithrombin III levels. Performed at [facility], [place], [place] [address]   Heparin level (unfractionated)     Status: Abnormal   Collection Time: 03/13/19 10:17 AM  Result Value Ref Range   Heparin Unfractionated 0.16 (L) 0.30 - 0.70 IU/mL    Comment: (NOTE) If heparin results are below expected values, and patient dosage has  been confirmed, suggest follow up testing of antithrombin III  levels. Performed at [facility], [address] [place], [place] [address]   ECHOCARDIOGRAM COMPLETE     Status: None   Collection Time: 03/13/19 12:16 PM  Result Value Ref Range   Weight 3,509.72 oz   Height 67 in   BP 148/91 mmHg  Heparin level (unfractionated)  Status: None   Collection Time: 03/13/19  6:21 PM  Result Value Ref Range   Heparin Unfractionated 0.30 0.30 - 0.70 IU/mL    Comment: (NOTE) If heparin results are below expected values, and patient dosage has  been confirmed, suggest follow up testing of antithrombin III levels. Performed at [facility], [place], [place] [address]   Heparin level (unfractionated)     Status: Abnormal   Collection Time: 03/14/19 12:56 AM  Result Value Ref Range   Heparin Unfractionated 0.27 (L) 0.30 - 0.70 IU/mL    Comment: (NOTE) If heparin results are below expected values, and patient dosage has  been confirmed, suggest follow up testing of antithrombin III levels. Performed at [facility] [address] [place], [place] [address]   CBC     Status: Abnormal   Collection Time: 03/14/19 12:56 AM  Result Value Ref Range   WBC 12.1 (H) 4.0 - 10.5 K/uL   RBC 4.99 4.22 - 5.81 MIL/uL   Hemoglobin 13.7 13.0 - 17.0 g/dL   HCT 42.0 39.0 - 52.0 %   MCV 84.2 80.0 - 100.0 fL   MCH 27.5 26.0 - 34.0 pg   MCHC 32.6 30.0 - 36.0 g/dL   RDW 12.7 11.5 - 15.5 %   Platelets 366 150 - 400 K/uL   nRBC 0.0 0.0 - 0.2 %    Comment: Performed at [facility], [address] [place], [place] [address]  POCT Activated clotting time     Status: None   Collection Time: 03/14/19  9:49 AM  Result Value Ref Range   Activated Clotting Time 367 seconds  CBC     Status: Abnormal   Collection Time: 03/15/19  6:48 AM  Result Value Ref Range   WBC 12.2 (H) 4.0 - 10.5 K/uL   RBC 4.99 4.22 - 5.81 MIL/uL   Hemoglobin 13.5 13.0 - 17.0 g/dL   HCT 42.4 39.0 - 52.0 %   MCV 85.0 80.0 - 100.0 fL    MCH 27.1 26.0 - 34.0 pg   MCHC 31.8 30.0 - 36.0 g/dL   RDW 12.8 11.5 - 15.5 %   Platelets 351 150 - 400 K/uL   nRBC 0.0 0.0 - 0.2 %    Comment: Performed at [facility], [address] [place], [place] [id number]  Basic metabolic panel     Status: Abnormal   Collection Time: 03/15/19  6:48 AM  Result Value Ref Range   Sodium 140 135 - 145 mmol/L   Potassium 4.4 3.5 - 5.1 mmol/L   Chloride 108 98 - 111 mmol/L   CO2 25 22 - 32 mmol/L   Glucose, Bld 146 (H) 70 - 99 mg/dL   BUN 9 6 - 20 mg/dL   Creatinine, Ser 0.60 (L) 0.61 - 1.24 mg/dL   Calcium 9.0 8.9 - 10.3 mg/dL   GFR calc non Af Amer >60 >60 mL/min   GFR calc Af Amer >60 >60 mL/min   Anion gap 7 5 - 15    Comment: Performed at [facility], [address] [place], [place] [address]     PHQ2/9: Depression screen [facility] 2/9 04/12/2019 03/22/2019 10/10/2018 06/09/2018 12/08/2017  Decreased Interest 0 2 0 0 0  Down, Depressed, Hopeless 0 0 0 0 0  PHQ - 2 Score 0 2 0 0 0  Altered sleeping 0 3 0 0 0  Tired, decreased energy 0 3 0 0 0  Change in appetite 0  2 0 0 0  Feeling bad or failure about yourself  0 1 0 0 0  Trouble concentrating 0 2 0 0 0  Moving slowly or fidgety/restless 0 0 0 0 0  Suicidal thoughts 0 0 0 0 0  PHQ-9 Score 0 13 0 0 0  Difficult doing work/chores - Somewhat difficult Not difficult at all - Not difficult at all    phq 9 is negative   Fall Risk: Fall Risk  04/12/2019 03/22/2019 10/10/2018 06/09/2018 12/08/2017  Falls in the past year? 0 0 1 1 No  Number falls in past yr: 0 0 1 1 -  Injury with Fall? 0 0 0 1 -  Risk for fall due to : - - History of fall(s) - -  Follow up - - Falls evaluation completed - -     Functional Status Survey: Is the patient deaf or have difficulty hearing?: No Does the patient have difficulty seeing, even when wearing glasses/contacts?: No Does the patient have difficulty concentrating, remembering, or making decisions?: No Does the patient have difficulty  walking or climbing stairs?: No Does the patient have difficulty dressing or bathing?: No Does the patient have difficulty doing errands alone such as visiting a doctor's office or shopping?: No    Assessment & Plan   1. Dyslipidemia associated with type 2 diabetes mellitus (HCC)  - POCT HgB A1C, hemoglobin too high to read, we will send him to the lab  2. Migraine without aura and responsive to treatment  - ketorolac (TORADOL) 10 MG tablet; Take 1 tablet (10 mg total) by mouth every 6 (six) hours as needed.  Dispense: 30 tablet; Refill: 0  3. Gastroesophageal reflux disease without esophagitis  - omeprazole (PRILOSEC) 20 MG capsule; Take 1 capsule (20 mg total) by mouth daily.  Dispense: 90 capsule; Refill: 1  4. Essential hypertension  - carvedilol (COREG) 25 MG tablet; Take 1 tablet (25 mg total) by mouth 2 (two) times daily with a meal.  Dispense: 180 tablet; Refill: 1  5. History of MI (myocardial infarction)  - clopidogrel (PLAVIX) 75 MG tablet; Take 1 tablet (75 mg total) by mouth daily with breakfast.  Dispense: 30 tablet; Refill: 5 - carvedilol (COREG) 25 MG tablet; Take 1 tablet (25 mg total) by mouth 2 (two) times daily with a meal.  Dispense: 180 tablet; Refill: 1  6. Atherosclerosis of aorta ([place])   7. Angina pectoris ([place])  Stop viagra  8. Obstructive apnea  Try to get another mask   9. Mucopurulent chronic bronchitis ([place])  Needs to quit smoking, he is trying   10. Rheumatoid arthritis involving multiple joints (HCC)  Keep follow up with Dr. Jefm Bryant  11. Diabetes mellitus type 2 in obese [facility])  Discussed diet

## 2019-04-13 LAB — HEMOGLOBIN A1C
Hgb A1c MFr Bld: 6.1 % of total Hgb — ABNORMAL HIGH (ref <5.7)
Mean Plasma Glucose: 128 (calc)
eAG (mmol/L): 7.1 (calc)

## 2019-04-17 ENCOUNTER — Other Ambulatory Visit: Payer: Self-pay | Admitting: Family Medicine

## 2019-04-17 DIAGNOSIS — G43009 Migraine without aura, not intractable, without status migrainosus: Secondary | ICD-10-CM

## 2019-04-23 ENCOUNTER — Other Ambulatory Visit: Payer: Self-pay | Admitting: Family Medicine

## 2019-04-23 DIAGNOSIS — G43009 Migraine without aura, not intractable, without status migrainosus: Secondary | ICD-10-CM

## 2019-04-26 ENCOUNTER — Other Ambulatory Visit: Payer: Self-pay | Admitting: Urology

## 2019-04-26 DIAGNOSIS — N400 Enlarged prostate without lower urinary tract symptoms: Secondary | ICD-10-CM

## 2019-05-04 ENCOUNTER — Other Ambulatory Visit: Payer: Self-pay | Admitting: Family Medicine

## 2019-05-04 DIAGNOSIS — G43009 Migraine without aura, not intractable, without status migrainosus: Secondary | ICD-10-CM

## 2019-05-04 NOTE — Telephone Encounter (Signed)
Requested medication (s) are due for refill today - no-depends on how often patient is using  Requested medication (s) are on the active medication list- yes  Future visit scheduled -yes  Last refill: 04/17/19  Notes to clinic: Patient is requesting refill of migraine medication- denied too soon previously. Sent for PCP review- may need to assess how often patient getting migraines   Requested Prescriptions  Pending Prescriptions Disp Refills   ketorolac (TORADOL) 10 MG tablet [Pharmacy Med Name: KETOROLAC 10 MG TABLET] 30 tablet 0    Sig: Take 1 tablet (10 mg total) by mouth every 6 (six) hours as needed.      Analgesics:  NSAIDS Failed - 05/04/2019  2:50 PM      Failed - Cr in normal range and within 360 days    Creat  Date Value Ref Range Status  06/09/2018 0.67 (L) 0.70 - 1.33 mg/dL Final    Comment:    For patients >53 years of age, the reference limit for Creatinine is approximately 13% higher for people identified as African-American. .    Creatinine, Ser  Date Value Ref Range Status  03/15/2019 0.60 (L) 0.61 - 1.24 mg/dL Final          Passed - HGB in normal range and within 360 days    Hemoglobin  Date Value Ref Range Status  03/15/2019 13.5 13.0 - 17.0 g/dL Final  07/05/2016 12.3 (L) 13.0 - 17.7 g/dL Final          Passed - Patient is not pregnant      Passed - Valid encounter within last 12 months    Recent Outpatient Visits           3 weeks ago Dyslipidemia associated with type 2 diabetes mellitus [facility])   [facility] Steele Sizer, MD   1 month ago Hospital discharge follow-up   [facility] Steele Sizer, MD   6 months ago Atherosclerosis of aorta [facility])   [facility], Drue Stager, MD   10 months ago Dyslipidemia associated with type 2 diabetes mellitus [facility])   [facility], Drue Stager, MD   1 year ago Dyslipidemia associated with type 2 diabetes mellitus [facility])   [facility] Steele Sizer, MD       Future Appointments             In 2 months Steele Sizer, MD [facility], [facility]                Requested Prescriptions  Pending Prescriptions Disp Refills   ketorolac (TORADOL) 10 MG tablet [Pharmacy Med Name: KETOROLAC 10 MG TABLET] 30 tablet 0    Sig: Take 1 tablet (10 mg total) by mouth every 6 (six) hours as needed.      Analgesics:  NSAIDS Failed - 05/04/2019  2:50 PM      Failed - Cr in normal range and within 360 days    Creat  Date Value Ref Range Status  06/09/2018 0.67 (L) 0.70 - 1.33 mg/dL Final    Comment:    For patients >32 years of age, the reference limit for Creatinine is approximately 13% higher for people identified as African-American. .    Creatinine, Ser  Date Value Ref Range Status  03/15/2019 0.60 (L) 0.61 - 1.24 mg/dL Final          Passed - HGB in normal range and within 360 days    Hemoglobin  Date Value Ref Range Status  03/15/2019 13.5 13.0 - 17.0 g/dL Final  07/05/2016 12.3 (L) 13.0 - 17.7 g/dL Final          Passed - Patient is not pregnant      Passed - Valid encounter within last 12 months    Recent Outpatient Visits           3 weeks ago Dyslipidemia associated with type 2 diabetes mellitus [facility])   [facility] Steele Sizer, MD   1 month ago Hospital discharge follow-up   [facility] Steele Sizer, MD   6 months ago Atherosclerosis of aorta [facility])   [facility] Steele Sizer, MD   10 months ago Dyslipidemia associated with type 2 diabetes mellitus [facility])   [facility] Steele Sizer, MD   1 year ago Dyslipidemia associated with type 2 diabetes mellitus [facility])   [facility] Steele Sizer, MD       Future Appointments             In 2 months Ancil Boozer, Drue Stager, MD [facility], [facility]

## 2019-05-23 ENCOUNTER — Telehealth: Payer: Self-pay

## 2019-05-23 ENCOUNTER — Other Ambulatory Visit: Payer: Self-pay | Admitting: Urology

## 2019-05-23 DIAGNOSIS — N400 Enlarged prostate without lower urinary tract symptoms: Secondary | ICD-10-CM

## 2019-05-23 NOTE — Telephone Encounter (Signed)
Refill request from pharmacy for tamsulosin was denied patient needs a follow up before refill can be sent. Attempted to reach patient to schedule no answer and mail box was full, mychart also sent

## 2019-07-10 ENCOUNTER — Other Ambulatory Visit: Payer: Self-pay

## 2019-07-10 ENCOUNTER — Ambulatory Visit: Payer: Self-pay | Admitting: Family Medicine

## 2019-07-10 ENCOUNTER — Encounter: Payer: Self-pay | Admitting: Family Medicine

## 2019-07-10 VITALS — BP 128/84 | HR 96 | Temp 97.5°F | Resp 18 | Ht 68.0 in | Wt 217.9 lb

## 2019-07-10 DIAGNOSIS — G4733 Obstructive sleep apnea (adult) (pediatric): Secondary | ICD-10-CM

## 2019-07-10 DIAGNOSIS — K219 Gastro-esophageal reflux disease without esophagitis: Secondary | ICD-10-CM

## 2019-07-10 DIAGNOSIS — I252 Old myocardial infarction: Secondary | ICD-10-CM

## 2019-07-10 DIAGNOSIS — M159 Polyosteoarthritis, unspecified: Secondary | ICD-10-CM

## 2019-07-10 DIAGNOSIS — R918 Other nonspecific abnormal finding of lung field: Secondary | ICD-10-CM

## 2019-07-10 DIAGNOSIS — J411 Mucopurulent chronic bronchitis: Secondary | ICD-10-CM

## 2019-07-10 DIAGNOSIS — M069 Rheumatoid arthritis, unspecified: Secondary | ICD-10-CM

## 2019-07-10 DIAGNOSIS — M8949 Other hypertrophic osteoarthropathy, multiple sites: Secondary | ICD-10-CM

## 2019-07-10 DIAGNOSIS — E1169 Type 2 diabetes mellitus with other specified complication: Secondary | ICD-10-CM

## 2019-07-10 DIAGNOSIS — I700 Atherosclerosis of aorta: Secondary | ICD-10-CM

## 2019-07-10 DIAGNOSIS — I10 Essential (primary) hypertension: Secondary | ICD-10-CM

## 2019-07-10 DIAGNOSIS — I209 Angina pectoris, unspecified: Secondary | ICD-10-CM

## 2019-07-10 DIAGNOSIS — E785 Hyperlipidemia, unspecified: Secondary | ICD-10-CM

## 2019-07-10 DIAGNOSIS — G43009 Migraine without aura, not intractable, without status migrainosus: Secondary | ICD-10-CM

## 2019-07-10 MED ORDER — CLOPIDOGREL BISULFATE 75 MG PO TABS: 75.0000 mg | ORAL_TABLET | Freq: Every day | ORAL | 1 refills | Status: DC

## 2019-07-10 MED ORDER — PRAVASTATIN SODIUM 40 MG PO TABS: 40.0000 mg | ORAL_TABLET | Freq: Every day | ORAL | 0 refills | Status: DC

## 2019-07-10 MED ORDER — PANTOPRAZOLE SODIUM 20 MG PO TBEC: 20.0000 mg | DELAYED_RELEASE_TABLET | Freq: Every day | ORAL | 1 refills | Status: DC

## 2019-07-10 NOTE — Progress Notes (Signed)
Name: Tim Anderson   MRN: [id number]    DOB: 09/06/1965   Date:07/10/2019       Progress Note  Subjective  Chief Complaint  Chief Complaint  Patient presents with  . Hyperlipidemia    3 month follow up  . Gastroesophageal Reflux  . Hypertension    HPI  Abnormal CT chest: order as a repeat for incidental findings on CT abdomen for renal evaluation. Discussed results with patient and recommend evaluation by pulmonologistduring his visit in April 2020,he saw Dr. Mortimer Fries on 06/20/2018, he was diagnosed with pulmonary fibrosis, COPD and advised to quit smoking. He is down to half pack daily, he stopped smoking for a period of time after MI in 02/2019 but is smoking again   From 05/2018  IMPRESSION: 1. Solid 5 mm right lower lobe pulmonary nodule is stable since 11/14/2017 chest CT, probably benign. 2. Right middle lobe 6 mm solid pulmonary nodule, not previously imaged. 3. Nonspecific mild mediastinal and right hilar lymphadenopathy. 4. Nonspecific patchy subpleural ground-glass attenuation and reticulation in the lungs without bronchiectasis or honeycombing. Imaging appearance favors nonspecific smoking related fibrosis, however a developing interstitial lung disease including UIP cannot be excluded. 5. Suggest attention to all of these findings on a follow-up dedicated high-resolution chest CT study in 6 months. 6. Three-vessel coronary atherosclerosis.  From 12/2018   IMPRESSION: 1. Stable exam. No new or progressive interval findings. 2. Stable appearance bilateral pulmonary nodules measuring up to 8 mm in the 6 months since prior study. Non-contrast chest CT at 12-18 months (from today's scan) is considered optional for low-risk patients, but is recommended for high-risk patients. This recommendation follows the consensus statement: Guidelines for Management of Incidental Pulmonary Nodules Detected on CT Images: From the Fleischner Society 2017; Radiology 2017;  [id number]. 3. The mild mediastinal lymphadenopathy is similar to prior. Attention on follow-up recommended. 4. Subpleural reticulation with areas of ground-glass attenuation in the lungs bilaterally. Potentially smoking related lung disease. As noted previously, early interstitial lung disease not excluded. 5. Aortic Atherosclerois (ICD10-170.0)  Angina he states had a stress test in October2018.He had his first MI on 02/2019 it was  NSTEMI 02/2019 he had a cardiac cath but stent was not placed, he is under the care of Dr. Nehemiah Massed, on high dose statin ( Atorvastatin )- he told me today he has not been taking it because it caused muscle aches - we will try changing to pravastatin today  and continue Plavix, beta blocker since the heart attack he continues to feel tired - unable to carry heavy loads at work, but improving gradually. He had an episode  chest pain that lasted 30 minutes when he took a dose of Viagra, he discussed it with cardiologist and he gave him the clear to take it prn, but he states ED is under control without medication at this time   GERD: he is on medication and symptoms are controlledhe has been taking medication daily, states when skipping symptoms returns.Unchanged   OSA: he has not been using CPAP machine anymore. He states he stopped about8years ago because he was feeling claustrophobic. He has tried a few times since heart attack in January 2021 , but states feels too hot and cannot sleep well with the machine. He states he thinks a different mask would work best, he will have insurance soon and we will try doing a repeat titration study to try different masks   Migraine/headaches: he has dull ache/tension type headachedull frontal headacheat least once a  week, he takes Toradol prn for that. No recent episodes of migraine . Unchanged   RA: sees Rheumatologist, off Enbrelon Plaquenilgiven by Dr. Jefm Bryant.He is now on hydrochloroquine. He states he has  swelling on both hands - fingers and MCP joints at the end of the day, he denies increase in warmth or redness   HTN:he deniesrecent episodes of chest pain ( only one episode since heart attack and saw Nehemiah Massed shortly after), no palpitation. He has been taking medications daily . He has noticed some dizziness mid morning since we adjusted his medications. BP is at goal today, advised to check bp when he gets dizzy, we may need to decrease dose of carvedilol   DMII: diagnosed Nov 2016, he is on diet only, gained a lot of weight since he quit smoking 11/2016, he resumed smoking last year, quit right after heart attack in January 2021 but after 6 weeks he resumed smoking again. He is smoking less than one pack daily , advised to try quitting again.He denies polyphagia, polydipsia, polyuria..Last A1C was at goal at 6.1 %. He has noticed some dizziness/off balance mid morning. Advised to check bp and glucose when it happens, to also stay hydrated   Hyperlipidemia: he was   on Lipitor since 02/2019 after MI , however stopped because of muscle aches, explained importance of statin therapy , discussed rechecking levels, but he still does not have insurance and wants to hold off until next visit   Atherosclerosis of aorta: on plavix and statin therapy   Patient Active Problem List   Diagnosis Date Noted  . Coronary artery disease involving native coronary artery of native heart 04/04/2019  . History of non-ST elevation myocardial infarction (NSTEMI) 03/12/2019  . 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 Atherosclerosis of aorta ([place]) 06/09/2018  . Mediastinal lymphadenopathy 06/09/2018  . Lung nodules 06/09/2018  . Arthralgia of left temporomandibular joint 12/08/2017  . Mucopurulent chronic bronchitis ([place]) 12/08/2017  . Mild left ventricular systolic dysfunction [id number]  . Grade II diastolic dysfunction [id number]  . Mild mitral regurgitation  07/05/2017  . Mild tricuspid regurgitation 07/05/2017  . History of left hip replacement 12/27/2016  . Angina pectoris ([place]) 10/27/2016  . Carpal tunnel syndrome on both sides 04/07/2016  . Primary osteoarthritis involving multiple joints 07/29/2015  . Arthritis of knee, degenerative 02/03/2015  . Benign prostatic hyperplasia 09/28/2014  . Chronic obstructive pulmonary disease ([place]) 09/28/2014  . Drug abuse ([place]) 09/28/2014  . Deflected nasal septum 09/28/2014  . Dyslipidemia 09/28/2014  . Dysmetabolic syndrome [id number]  . GERD (gastroesophageal reflux disease) 09/28/2014  . H/O renal calculi 09/28/2014  . HTN (hypertension) 09/28/2014  . Male hypogonadism 09/28/2014  . Failure of erection 09/28/2014  . Obstructive sleep apnea syndrome 09/28/2014  . Depression with anxiety 09/28/2014  . Obesity (BMI 30-39.9) 09/28/2014  . Migraine without aura and responsive to treatment 09/28/2014  . Perennial allergic rhinitis with seasonal variation 09/28/2014  . Elevated platelet count 09/28/2014  . Tobacco abuse 09/28/2014  . Cerebrovascular accident (CVA) ([place]) 07/27/2013  . History of TIA (transient ischemic attack) 09/20/2011  . Rheumatoid arthritis involving multiple joints ([place]) 07/31/2009    Past Surgical History:  Procedure Laterality Date  . CORONARY BALLOON ANGIOPLASTY N/A 03/14/2019   Procedure: CORONARY BALLOON ANGIOPLASTY;  Surgeon: Yolonda Kida, MD;  Location: [place] [facility];  Service: Cardiovascular;  Laterality: N/A;  . FACIAL RECONSTRUCTION SURGERY    . JOINT REPLACEMENT Left    hip  . JOINT  REPLACEMENT Right    knee  . KNEE SURGERY Right   . LEFT HEART CATH AND CORONARY ANGIOGRAPHY N/A 03/14/2019   Procedure: LEFT HEART CATH AND CORONARY ANGIOGRAPHY;  Surgeon: Corey Skains, MD;  Location: [place] [facility];  Service: Cardiovascular;  Laterality: N/A;    Family History  Problem Relation Age of Onset  . Diabetes Mother   . Diabetes Father   .  Heart attack Father   . Cancer Brother        Skin and Prostate-Oldest Brother  . Diabetes Brother     Social History   Tobacco Use  . Smoking status: Current Some Day Smoker    Packs/day: 1.00    Years: 30.00    Pack years: 30.00    Types: Cigarettes    Start date: 09/30/1976  . Smokeless tobacco: Never Used  Substance Use Topics  . Alcohol use: Yes    Alcohol/week: 4.0 standard drinks    Types: 4 Cans of beer per week    Comment: daily     Current Outpatient Medications:  .  amLODipine-benazepril (LOTREL) 5-20 MG capsule, Take 1 capsule by mouth daily. (Patient taking differently: Take 1 capsule by mouth at bedtime. ), Disp: 90 capsule, Rfl: 1 .  aspirin EC 81 MG tablet, Take 81 mg by mouth. Reported on 07/29/2015, Disp: , Rfl:  .  carvedilol (COREG) 25 MG tablet, Take 1 tablet (25 mg total) by mouth 2 (two) times daily with a meal., Disp: 180 tablet, Rfl: 1 .  Fluticasone-Umeclidin-Vilant (TRELEGY ELLIPTA IN), Inhale 1 puff into the lungs daily., Disp: , Rfl:  .  hydroxychloroquine (PLAQUENIL) 200 MG tablet, Take 400 tablets by mouth at bedtime. , Disp: , Rfl:  .  omeprazole (PRILOSEC) 20 MG capsule, Take 1 capsule (20 mg total) by mouth daily., Disp: 90 capsule, Rfl: 1 .  tamsulosin (FLOMAX) 0.4 MG CAPS capsule, Take 1 capsule (0.4 mg total) by mouth daily., Disp: 30 capsule, Rfl: 0 .  atorvastatin (LIPITOR) 80 MG tablet, Take 1 tablet (80 mg total) by mouth daily at 6 PM., Disp: 30 tablet, Rfl: 0 .  isosorbide mononitrate (IMDUR) 30 MG 24 hr tablet, Take 1 tablet (30 mg total) by mouth daily., Disp: 30 tablet, Rfl: 0 .  ketorolac (TORADOL) 10 MG tablet, Take 1 tablet (10 mg total) by mouth every 6 (six) hours as needed. (Patient not taking: Reported on 07/10/2019), Disp: 30 tablet, Rfl: 0 .  nitroGLYCERIN (NITROSTAT) 0.4 MG SL tablet, Place 1 tablet (0.4 mg total) under the tongue every 5 (five) minutes x 3 doses as needed for chest pain. (Patient not taking: Reported on  07/10/2019), Disp: 100 tablet, Rfl: 0 .  promethazine (PHENERGAN) 12.5 MG tablet, Take 1 tablet (12.5 mg total) by mouth every 8 (eight) hours as needed for nausea or vomiting. (Patient not taking: Reported on 07/10/2019), Disp: 20 tablet, Rfl: 0 .  rizatriptan (MAXALT) 10 MG tablet, Take 1 tablet (10 mg total) by mouth as needed for migraine. (Patient not taking: Reported on 07/10/2019), Disp: 10 tablet, Rfl: 0  Allergies  Allergen Reactions  . Ciprofloxacin Other (See Comments)    Body aches  . Crestor [Rosuvastatin Calcium] Other (See Comments)    Muscles aches  . Droperidol Other (See Comments)    Heart stopped  . Lipofen  [Fenofibrate]     muscle aches muscle aches  . Niacin Er     flushed    I personally reviewed active problem list, medication list,  allergies, family history, social history, health maintenance with the patient/caregiver today.   ROS  Constitutional: Negative for fever or weight change.  Respiratory: positive  for chronic cough and shortness of breath moderate activity .   Cardiovascular: Negative for chest pain or palpitations.  Gastrointestinal: Negative for abdominal pain, no bowel changes.  Musculoskeletal: Negative for gait problem , positive for intermittent joint swelling.  Skin: Negative for rash.  Neurological: Positive  for intermittent dizziness and  headache.  No other specific complaints in a complete review of systems (except as listed in HPI above).  Objective  Vitals:   07/10/19 0746  BP: 128/84  Pulse: 96  Resp: 18  Temp: (!) 97.5 F (36.4 C)  TempSrc: Temporal  SpO2: 93%  Weight: 217 lb 14.4 oz (98.8 kg)  Height: 5\' 8"  (1.727 m)    Body mass index is 33.13 kg/m.  Physical Exam  Constitutional: Patient appears well-developed and well-nourished. Obese No distress.  HEENT: head atraumatic, normocephalic, pupils equal and reactive to light,  neck supple Cardiovascular: Normal rate, regular rhythm and normal heart sounds.  No  murmur heard. No BLE edema. Pulmonary/Chest: Effort normal and breath sounds normal. No respiratory distress. Abdominal: Soft.  There is no tenderness. Muscular Skeletal: no synovitis today, well healed scar of right knee surgery, good rom of knee, decrease rom of both hips, left worse than right side ( history of left knee replacement)  Psychiatric: Patient has a normal mood and affect. behavior is normal. Judgment and thought content normal.  Recent Results (from the past 2160 hour(s))  Hemoglobin A1c     Status: Abnormal   Collection Time: 04/12/19  9:13 AM  Result Value Ref Range   Hgb A1c MFr Bld 6.1 (H) <5.7 % of total Hgb    Comment: For someone without known diabetes, a hemoglobin  A1c value between 5.7% and 6.4% is consistent with prediabetes and should be confirmed with a  follow-up test. . For someone with known diabetes, a value <7% indicates that their diabetes is well controlled. A1c targets should be individualized based on duration of diabetes, age, comorbid conditions, and other considerations. . This assay result is consistent with an increased risk of diabetes. . Currently, no consensus exists regarding use of hemoglobin A1c for diagnosis of diabetes for children. .    Mean Plasma Glucose 128 (calc)   eAG (mmol/L) 7.1 (calc)      PHQ2/9: Depression screen [facility] 2/9 07/10/2019 04/12/2019 03/22/2019 10/10/2018 06/09/2018  Decreased Interest 0 0 2 0 0  Down, Depressed, Hopeless 0 0 0 0 0  PHQ - 2 Score 0 0 2 0 0  Altered sleeping 1 0 3 0 0  Tired, decreased energy 1 0 3 0 0  Change in appetite 0 0 2 0 0  Feeling bad or failure about yourself  0 0 1 0 0  Trouble concentrating 0 0 2 0 0  Moving slowly or fidgety/restless 0 0 0 0 0  Suicidal thoughts 0 0 0 0 0  PHQ-9 Score 2 0 13 0 0  Difficult doing work/chores Not difficult at all - Somewhat difficult Not difficult at all -  Some recent data might be hidden    phq 9 is negative    Fall Risk: Fall Risk   07/10/2019 04/12/2019 03/22/2019 10/10/2018 06/09/2018  Falls in the past year? 1 0 0 1 1  Number falls in past yr: 1 0 0 1 1  Injury with Fall? 1 0 0 0 1  Risk for fall due to : History of fall(s) - - History of fall(s) -  Follow up Falls evaluation completed - - Falls evaluation completed -      Assessment & Plan  1. Dyslipidemia associated with type 2 diabetes mellitus ([place])  Needs to try pravastatin and in the future if unable to tolerate try livalo and zetia  2. Essential hypertension  At goal   3. Migraine without aura and responsive to treatment  Doing well at this time  4. History of MI (myocardial infarction)  - pravastatin (PRAVACHOL) 40 MG tablet; Take 1 tablet (40 mg total) by mouth daily. In place of atorvastatin  Dispense: 30 tablet; Refill: 0 - clopidogrel (PLAVIX) 75 MG tablet; Take 1 tablet (75 mg total) by mouth daily.  Dispense: 90 tablet; Refill: 1  5. Gastroesophageal reflux disease without esophagitis  - pantoprazole (PROTONIX) 20 MG tablet; Take 1 tablet (20 mg total) by mouth daily.  Dispense: 90 tablet; Refill: 1  6. Atherosclerosis of aorta (HCC)  - pravastatin (PRAVACHOL) 40 MG tablet; Take 1 tablet (40 mg total) by mouth daily. In place of atorvastatin  Dispense: 30 tablet; Refill: 0 - clopidogrel (PLAVIX) 75 MG tablet; Take 1 tablet (75 mg total) by mouth daily.  Dispense: 90 tablet; Refill: 1  7. Angina pectoris (HCC)  - pravastatin (PRAVACHOL) 40 MG tablet; Take 1 tablet (40 mg total) by mouth daily. In place of atorvastatin  Dispense: 30 tablet; Refill: 0 - clopidogrel (PLAVIX) 75 MG tablet; Take 1 tablet (75 mg total) by mouth daily.  Dispense: 90 tablet; Refill: 1  8. Rheumatoid arthritis involving multiple joints (HCC)  Keep follow up with Dr. Jefm Bryant   9. Obstructive apnea  He will go back for titration study when he has insurance  10. Mucopurulent chronic bronchitis ([place])  On Trelegy   11. Primary osteoarthritis involving multiple  joints  stable  12. Lung nodules  Needs to follow up with Dr. Mortimer Fries

## 2019-07-28 ENCOUNTER — Other Ambulatory Visit: Payer: Self-pay | Admitting: Urology

## 2019-07-28 DIAGNOSIS — N400 Enlarged prostate without lower urinary tract symptoms: Secondary | ICD-10-CM

## 2019-08-07 NOTE — Progress Notes (Signed)
08/08/2019 10:59 AM   Tim Anderson 03/18/1965 [id number]  Referring provider: Steele Sizer, MD [address] [place],  [place] [address]  Chief Complaint  Patient presents with  . Benign Prostatic Hypertrophy    1year    HPI: Patient is a 54 year old male with nephrolithiasis, BPH with LU TS and ED who presents today for follow up.    Nephrolithiasis Was followed by Dr. Jacqlyn Larsen for BPH and nephrolithiasis.  ESWL 2005.  CT Renal stone study 11/14/2017 noted 6 mm distal left ureteral calculus or 2 adjacent calculi at the ureterovesical junction, causing moderate left hydronephrosis and hydroureter.  Tiny, nonobstructing mid left renal calculus.  Spontaneously passed RUS 12/05/2017 normal.  No recent complaints of renal colic, gross hematuria or fragments.  His UA is negative for micro heme.   BPH WITH LUTS  (prostate and/or bladder) IPSS score: 27/5   PVR: 33 mL     Major complaint(s): Weak urinary stream x 2 weeks since out of tamsulosin -lower abdominal pain and genital pain x two years. Denies any dysuria, hematuria or suprapubic pain.   Currently taking: Tamsulosin 0.4 mg daily   Denies any recent fevers, chills, nausea or vomiting.  His oldest brother had his prostate removed due to cancer at age 39.    IPSS    Row Name 08/08/19 0900         International Prostate Symptom Score   How often have you had the sensation of not emptying your bladder? About half the time     How often have you had to urinate less than every two hours? More than half the time     How often have you found you stopped and started again several times when you urinated? Almost always     How often have you found it difficult to postpone urination? Almost always     How often have you had a weak urinary stream? Almost always     How often have you had to strain to start urination? Less than half the time     How many times did you typically get up at night to urinate? 3 Times      Total IPSS Score 27       Quality of Life due to urinary symptoms   If you were to spend the rest of your life with your urinary condition just the way it is now how would you feel about that? Unhappy            Score:  1-7 Mild 8-19 Moderate 20-35 Severe  Erectile dysfunction SHIM score: 21   Main complaint: Major improvement in erections since MI Risk factors:  age, BPH, HTN, HLD, sleep apnea, CAD, and smoking  No painful erections or curvatures with his erections.    Still having spontaneous erections.  Tried:    Sildenafil with good results     SHIM    Row Name 08/08/19 0951         SHIM: Over the last 6 months:   How do you rate your confidence that you could get and keep an erection? Low     When you had erections with sexual stimulation, how often were your erections hard enough for penetration (entering your partner)? Most Times (much more than half the time)     During sexual intercourse, how often were you able to maintain your erection after you had penetrated (entered) your partner? Almost Always or Always  During sexual intercourse, how difficult was it to maintain your erection to completion of intercourse? Not Difficult     When you attempted sexual intercourse, how often was it satisfactory for you? Almost Always or Always       SHIM Total Score   SHIM 21            Score: 1-7 Severe ED 8-11 Moderate ED 12-16 Mild-Moderate ED 17-21 Mild ED 22-25 No ED   He also mention to me that for the last 4 months he has been passing bright red blood with his stools through his rectum.  He has not had a recent colonoscopy.     PMH: Past Medical History:  Diagnosis Date  . Allergy   . Arthritis   . BPH (benign prostatic hyperplasia)   . COPD (chronic obstructive pulmonary disease) ([place])   . Drug abuse ([place])   . Dysmetabolic syndrome X   . Erosive gastritis   . History of hip replacement 12/2016  . History of knee replacement 01/2015  . History  of left hip replacement 12/27/2016  . Hyperlipidemia   . Hypertension   . Hypogonadism male   . Obesity   . Obsessive compulsive disorder   . OSA (obstructive sleep apnea)   . Radiculitis   . Stomatitis monilial   . Stroke ([place])   . Thrombocytosis ([place])     Surgical History: Past Surgical History:  Procedure Laterality Date  . CORONARY BALLOON ANGIOPLASTY N/A 03/14/2019   Procedure: CORONARY BALLOON ANGIOPLASTY;  Surgeon: Yolonda Kida, MD;  Location: [facility];  Service: Cardiovascular;  Laterality: N/A;  . FACIAL RECONSTRUCTION SURGERY    . JOINT REPLACEMENT Left    hip  . JOINT REPLACEMENT Right    knee  . KNEE SURGERY Right   . LEFT HEART CATH AND CORONARY ANGIOGRAPHY N/A 03/14/2019   Procedure: LEFT HEART CATH AND CORONARY ANGIOGRAPHY;  Surgeon: Corey Skains, MD;  Location: [facility];  Service: Cardiovascular;  Laterality: N/A;    Home Medications:  Allergies as of 08/08/2019      Reactions   Ciprofloxacin Other (See Comments)   Body aches   Crestor [rosuvastatin Calcium] Other (See Comments)   Muscles aches   Droperidol Other (See Comments)   Heart stopped   Lipofen  [fenofibrate]    muscle aches muscle aches   Niacin Er    flushed      Medication List       Accurate as of August 08, 2019 10:59 AM. If you have any questions, ask your nurse or doctor.        amLODipine-benazepril 5-20 MG capsule Commonly known as: LOTREL Take 1 capsule by mouth daily. What changed: when to take this   aspirin EC 81 MG tablet Take 81 mg by mouth. Reported on 07/29/2015   carvedilol 25 MG tablet Commonly known as: COREG Take 1 tablet (25 mg total) by mouth 2 (two) times daily with a meal.   clopidogrel 75 MG tablet Commonly known as: PLAVIX Take 1 tablet (75 mg total) by mouth daily.   hydroxychloroquine 200 MG tablet Commonly known as: PLAQUENIL Take 400 tablets by mouth at bedtime.   ketorolac 10 MG tablet Commonly known as:  TORADOL Take 1 tablet (10 mg total) by mouth every 6 (six) hours as needed.   nitroGLYCERIN 0.4 MG SL tablet Commonly known as: NITROSTAT Place 1 tablet (0.4 mg total) under the tongue every 5 (five) minutes x 3 doses  as needed for chest pain.   pantoprazole 20 MG tablet Commonly known as: PROTONIX Take 1 tablet (20 mg total) by mouth daily.   pravastatin 40 MG tablet Commonly known as: PRAVACHOL Take 1 tablet (40 mg total) by mouth daily. In place of atorvastatin   promethazine 12.5 MG tablet Commonly known as: PHENERGAN Take 1 tablet (12.5 mg total) by mouth every 8 (eight) hours as needed for nausea or vomiting.   rizatriptan 10 MG tablet Commonly known as: MAXALT Take 1 tablet (10 mg total) by mouth as needed for migraine.   sildenafil 20 MG tablet Commonly known as: REVATIO Take 3 to 5 tablets two hours before intercouse on an empty stomach.  Do not take with nitrates. Started by: Zara Council, PA-C   tamsulosin 0.4 MG Caps capsule Commonly known as: FLOMAX Take 1 capsule (0.4 mg total) by mouth daily.   TRELEGY ELLIPTA IN Inhale 1 puff into the lungs daily.       Allergies:  Allergies  Allergen Reactions  . Ciprofloxacin Other (See Comments)    Body aches  . Crestor [Rosuvastatin Calcium] Other (See Comments)    Muscles aches  . Droperidol Other (See Comments)    Heart stopped  . Lipofen  [Fenofibrate]     muscle aches muscle aches  . Niacin Er     flushed    Family History: Family History  Problem Relation Age of Onset  . Diabetes Mother   . Diabetes Father   . Heart attack Father   . Cancer Brother        Skin and Prostate-Oldest Brother  . Diabetes Brother     Social History:  reports that he has been smoking cigarettes. He started smoking about 42 years ago. He has a 30.00 pack-year smoking history. He has never used smokeless tobacco. He reports current alcohol use of about 4.0 standard drinks of alcohol per week. He reports previous drug  use. Drug: Marijuana.  ROS: For pertinent review of systems please refer to history of present illness  Physical Exam: BP (!) 157/74   Pulse 80   Ht 5\' 7"  (1.702 m)   Wt 215 lb (97.5 kg)   BMI 33.67 kg/m   Constitutional:  Well nourished. Alert and oriented, No acute distress. HEENT: [place] AT, mask in place.  Trachea midline, Cardiovascular: No clubbing, cyanosis, or edema. Respiratory: Normal respiratory effort, no increased work of breathing. GI: Abdomen is soft, non tender, non distended, no abdominal masses. Liver and spleen not palpable.  No hernias appreciated.  Stool sample for occult testing is not indicated.   GU: No CVA tenderness.  No bladder fullness or masses.  Patient with circumcised phallus.  Urethral meatus is patent.  No penile discharge. No penile lesions or rashes. Scrotum without lesions, cysts, rashes and/or edema.  Testicles are located scrotally bilaterally. No masses are appreciated in the testicles. Left and right epididymis are normal. Rectal: Patient with  normal sphincter tone. Anus and perineum without scarring or rashes. No rectal masses are appreciated. Prostate is approximately 50 grams, could only palpate the apex, no nodules are appreciated. Seminal vesicles could not be palpated Skin: No rashes, bruises or suspicious lesions. Lymph: No inguinal adenopathy. Neurologic: Grossly intact, no focal deficits, moving all 4 extremities. Psychiatric: Normal mood and affect.   Laboratory Data: Lab Results  Component Value Date   WBC 12.2 (H) 03/15/2019   HGB 13.5 03/15/2019   HCT 42.4 03/15/2019   MCV 85.0 03/15/2019   PLT  351 03/15/2019    Lab Results  Component Value Date   CREATININE 0.60 (L) 03/15/2019    Lab Results  Component Value Date   PSA Normal 09/06/2013    Lab Results  Component Value Date   HGBA1C 6.1 (H) 04/12/2019    Lab Results  Component Value Date   TSH 3.71 10/23/2012       Component Value Date/Time   CHOL 164  03/13/2019 0210   CHOL 162 07/05/2016 0837   CHOL 160 08/29/2011 0453   HDL 26 (L) 03/13/2019 0210   HDL 30 (L) 07/05/2016 0837   HDL 18 (L) 08/29/2011 0453   CHOLHDL 6.3 03/13/2019 0210   VLDL 50 (H) 03/13/2019 0210   VLDL 55 (H) 08/29/2011 0453   LDLCALC 88 03/13/2019 0210   LDLCALC 111 (H) 06/09/2018 0829   LDLCALC 87 08/29/2011 0453    Lab Results  Component Value Date   AST 12 06/09/2018   Lab Results  Component Value Date   ALT 15 06/09/2018    Urinalysis Component     Latest Ref Rng & Units 08/08/2019          Specific Gravity, UA     1.005 - 1.030 1.015  pH, UA     5.0 - 7.5 7.5  Color, UA     Yellow Yellow  Appearance Ur     Clear Clear  Leukocytes,UA     Negative Negative  Protein,UA     Negative/Trace Negative  Glucose, UA     Negative Negative  Ketones, UA     Negative Negative  RBC, UA     Negative Negative  Bilirubin, UA     Negative Negative  Urobilinogen, Ur     0.2 - 1.0 mg/dL 0.2  Nitrite, UA     Negative Negative  Microscopic Examination      See below:   Component     Latest Ref Rng & Units 08/08/2019  WBC, UA     0 - 5 /hpf 0-5  RBC     0 - 2 /hpf 0-2  Epithelial Cells (non renal)     0 - 10 /hpf None seen  Bacteria, UA     None seen/Few None seen    I have reviewed the labs.   Pertinent Imaging: Results for Tim, Anderson (MRN [id number]) as of 08/08/2019 13:56  Ref. Range 08/08/2019 09:57  Scan Result Unknown 15ml    Assessment & Plan:    1. Nephrolithiasis No recent episodes of renal colic, gross hematuria or passage of fragments We will continue to monitor clinically  2. BPH with LUTS IPSS score is 27/5 Continue conservative management, avoiding bladder irritants and timed voiding's Most bothersome symptoms is/are weak stream, which he contributes to being without his tamsulosin for 2 weeks and urgency which we discussed patient undergoing cystoscopy for further evaluation or receive samples of Myrbetriq  25 mg, #25-he would like to try the samples prior to undergoing cystoscopy Continue tamsulosin 0.4 mg daily - refills given PSA pending RTC in 3 weeks for  I PSS and PVR and   3. Erectile dysfunction SHIM score is 21 Continue Sildenafil 20 mg, 3 to 5 tablets two hours prior to intercourse on an empty stomach, # 30; he is warned not to take medications that contain nitrates.  I also advised him of the side effects, such as: headache, flushing, dyspepsia, abnormal vision, nasal congestion, back pain, myalgia, nausea, dizziness, and rash. RTC in 12 months for  repeat SHIM score and exam   4. Rectal bleeding Refer to gastroenterology for further evaluation  Return in about 3 weeks (around 08/29/2019) for IPSS and PVR.  These notes generated with voice recognition software. I apologize for typographical errors.  Zara Council, PA-C  [facility] [address]  [place], [place] [address] [phone]

## 2019-08-08 ENCOUNTER — Other Ambulatory Visit: Payer: Self-pay

## 2019-08-08 ENCOUNTER — Ambulatory Visit (INDEPENDENT_AMBULATORY_CARE_PROVIDER_SITE_OTHER): Payer: Self-pay | Admitting: Urology

## 2019-08-08 ENCOUNTER — Encounter: Payer: Self-pay | Admitting: Urology

## 2019-08-08 VITALS — BP 157/74 | HR 80 | Ht 67.0 in | Wt 215.0 lb

## 2019-08-08 DIAGNOSIS — N400 Enlarged prostate without lower urinary tract symptoms: Secondary | ICD-10-CM

## 2019-08-08 DIAGNOSIS — N529 Male erectile dysfunction, unspecified: Secondary | ICD-10-CM

## 2019-08-08 DIAGNOSIS — N401 Enlarged prostate with lower urinary tract symptoms: Secondary | ICD-10-CM

## 2019-08-08 DIAGNOSIS — N138 Other obstructive and reflux uropathy: Secondary | ICD-10-CM

## 2019-08-08 DIAGNOSIS — K625 Hemorrhage of anus and rectum: Secondary | ICD-10-CM

## 2019-08-08 DIAGNOSIS — N200 Calculus of kidney: Secondary | ICD-10-CM

## 2019-08-08 LAB — BLADDER SCAN AMB NON-IMAGING

## 2019-08-08 MED ORDER — SILDENAFIL CITRATE 20 MG PO TABS: ORAL_TABLET | ORAL | 0 refills | Status: DC

## 2019-08-08 MED ORDER — TAMSULOSIN HCL 0.4 MG PO CAPS: 0.4000 mg | ORAL_CAPSULE | Freq: Every day | ORAL | 3 refills | Status: DC

## 2019-08-08 MED ORDER — MIRABEGRON ER 25 MG PO TB24: 25.0000 mg | ORAL_TABLET | Freq: Every day | ORAL | 0 refills | Status: DC

## 2019-08-09 ENCOUNTER — Telehealth: Payer: Self-pay | Admitting: Family Medicine

## 2019-08-09 LAB — URINALYSIS, COMPLETE
Bilirubin, UA: NEGATIVE
Glucose, UA: NEGATIVE
Ketones, UA: NEGATIVE
Leukocytes,UA: NEGATIVE
Nitrite, UA: NEGATIVE
Protein,UA: NEGATIVE
RBC, UA: NEGATIVE
Specific Gravity, UA: 1.015 (ref 1.005–1.030)
Urobilinogen, Ur: 0.2 mg/dL (ref 0.2–1.0)
pH, UA: 7.5 (ref 5.0–7.5)

## 2019-08-09 LAB — PSA: Prostate Specific Ag, Serum: 1.5 ng/mL (ref 0.0–4.0)

## 2019-08-09 LAB — MICROSCOPIC EXAMINATION
Bacteria, UA: NONE SEEN
Epithelial Cells (non renal): NONE SEEN /hpf (ref 0–10)

## 2019-08-09 NOTE — Telephone Encounter (Signed)
Patient notified

## 2019-08-09 NOTE — Telephone Encounter (Signed)
-----   Message from Nori Riis, PA-C sent at 08/09/2019  7:53 AM EDT ----- Please let Mr. Brookens know that his PSA is normal at 1.5.

## 2019-08-28 NOTE — Progress Notes (Deleted)
08/29/2019 10:38 PM   Tim Anderson 1966-01-13 [id number]  Referring provider: Steele Sizer, MD [address] [place],  [place] [address]  No chief complaint on file.   HPI: Tim Anderson is a 54 y.o. male with nephrolithiasis, BPH with LU TS and ED who presents today for follow up.    Nephrolithiasis Was followed by Dr. Jacqlyn Larsen for BPH and nephrolithiasis.  ESWL 2005.  CT Renal stone study 11/14/2017 noted 6 mm distal left ureteral calculus or 2 adjacent calculi at the ureterovesical junction, causing moderate left hydronephrosis and hydroureter.  Tiny, nonobstructing mid left renal calculus.  Spontaneously passed RUS 12/05/2017 normal.  No recent complaints of renal colic, gross hematuria or fragments.    BPH WITH LUTS  (prostate and/or bladder) I PSS score: ***      PVR: ***                                Previous IPSS score: 27/5   Previous PVR: 33 mL     Major complaint(s): Weak urinary stream x 2 weeks since out of tamsulosin -lower abdominal pain and genital pain x two years. Denies any dysuria, hematuria or suprapubic pain.   Currently taking: Tamsulosin 0.4 mg daily and Myrbetriq 25 mg daily   Denies any recent fevers, chills, nausea or vomiting.  His oldest brother had his prostate removed due to cancer at age 79.     Score:  1-7 Mild 8-19 Moderate 20-35 Severe  Erectile dysfunction SHIM score: 21   Main complaint: Major improvement in erections since MI Risk factors:  age, BPH, HTN, HLD, sleep apnea, CAD, and smoking  No painful erections or curvatures with his erections.    Still having spontaneous erections.  Tried:    Sildenafil with good results      Score: 1-7 Severe ED 8-11 Moderate ED 12-16 Mild-Moderate ED 17-21 Mild ED 22-25 No ED     PMH: Past Medical History:  Diagnosis Date  . Allergy   . Arthritis   . BPH (benign prostatic hyperplasia)   . COPD (chronic obstructive pulmonary disease) ([place])   . Drug abuse  ([place])   . Dysmetabolic syndrome X   . Erosive gastritis   . History of hip replacement 12/2016  . History of knee replacement 01/2015  . History of left hip replacement 12/27/2016  . Hyperlipidemia   . Hypertension   . Hypogonadism male   . Obesity   . Obsessive compulsive disorder   . OSA (obstructive sleep apnea)   . Radiculitis   . Stomatitis monilial   . Stroke ([place])   . Thrombocytosis ([place])     Surgical History: Past Surgical History:  Procedure Laterality Date  . CORONARY BALLOON ANGIOPLASTY N/A 03/14/2019   Procedure: CORONARY BALLOON ANGIOPLASTY;  Surgeon: Yolonda Kida, MD;  Location: [facility];  Service: Cardiovascular;  Laterality: N/A;  . FACIAL RECONSTRUCTION SURGERY    . JOINT REPLACEMENT Left    hip  . JOINT REPLACEMENT Right    knee  . KNEE SURGERY Right   . LEFT HEART CATH AND CORONARY ANGIOGRAPHY N/A 03/14/2019   Procedure: LEFT HEART CATH AND CORONARY ANGIOGRAPHY;  Surgeon: Corey Skains, MD;  Location: [facility];  Service: Cardiovascular;  Laterality: N/A;    Home Medications:  Allergies as of 08/29/2019      Reactions   Ciprofloxacin Other (See Comments)  Body aches   Crestor [rosuvastatin Calcium] Other (See Comments)   Muscles aches   Droperidol Other (See Comments)   Heart stopped   Lipofen  [fenofibrate]    muscle aches muscle aches   Niacin Er    flushed      Medication List       Accurate as of August 28, 2019 10:38 PM. If you have any questions, ask your nurse or doctor.        amLODipine-benazepril 5-20 MG capsule Commonly known as: LOTREL Take 1 capsule by mouth daily. What changed: when to take this   aspirin EC 81 MG tablet Take 81 mg by mouth. Reported on 07/29/2015   carvedilol 25 MG tablet Commonly known as: COREG Take 1 tablet (25 mg total) by mouth 2 (two) times daily with a meal.   clopidogrel 75 MG tablet Commonly known as: PLAVIX Take 1 tablet (75 mg total) by mouth daily.     hydroxychloroquine 200 MG tablet Commonly known as: PLAQUENIL Take 400 tablets by mouth at bedtime.   ketorolac 10 MG tablet Commonly known as: TORADOL Take 1 tablet (10 mg total) by mouth every 6 (six) hours as needed.   mirabegron ER 25 MG Tb24 tablet Commonly known as: MYRBETRIQ Take 1 tablet (25 mg total) by mouth daily.   nitroGLYCERIN 0.4 MG SL tablet Commonly known as: NITROSTAT Place 1 tablet (0.4 mg total) under the tongue every 5 (five) minutes x 3 doses as needed for chest pain.   pantoprazole 20 MG tablet Commonly known as: PROTONIX Take 1 tablet (20 mg total) by mouth daily.   pravastatin 40 MG tablet Commonly known as: PRAVACHOL Take 1 tablet (40 mg total) by mouth daily. In place of atorvastatin   promethazine 12.5 MG tablet Commonly known as: PHENERGAN Take 1 tablet (12.5 mg total) by mouth every 8 (eight) hours as needed for nausea or vomiting.   rizatriptan 10 MG tablet Commonly known as: MAXALT Take 1 tablet (10 mg total) by mouth as needed for migraine.   sildenafil 20 MG tablet Commonly known as: REVATIO Take 3 to 5 tablets two hours before intercouse on an empty stomach.  Do not take with nitrates.   tamsulosin 0.4 MG Caps capsule Commonly known as: FLOMAX Take 1 capsule (0.4 mg total) by mouth daily.   TRELEGY ELLIPTA IN Inhale 1 puff into the lungs daily.       Allergies:  Allergies  Allergen Reactions  . Ciprofloxacin Other (See Comments)    Body aches  . Crestor [Rosuvastatin Calcium] Other (See Comments)    Muscles aches  . Droperidol Other (See Comments)    Heart stopped  . Lipofen  [Fenofibrate]     muscle aches muscle aches  . Niacin Er     flushed    Family History: Family History  Problem Relation Age of Onset  . Diabetes Mother   . Diabetes Father   . Heart attack Father   . Cancer Brother        Skin and Prostate-Oldest Brother  . Diabetes Brother     Social History:  reports that he has been smoking  cigarettes. He started smoking about 42 years ago. He has a 30.00 pack-year smoking history. He has never used smokeless tobacco. He reports current alcohol use of about 4.0 standard drinks of alcohol per week. He reports previous drug use. Drug: Marijuana.  ROS: For pertinent review of systems please refer to history of present illness  Physical Exam:  There were no vitals taken for this visit.  Constitutional:  Well nourished. Alert and oriented, No acute distress. HEENT: [place] AT, moist mucus membranes.  Trachea midline, no masses. Cardiovascular: No clubbing, cyanosis, or edema. Respiratory: Normal respiratory effort, no increased work of breathing. GI: Abdomen is soft, non tender, non distended, no abdominal masses. Liver and spleen not palpable.  No hernias appreciated.  Stool sample for occult testing is not indicated.   GU: No CVA tenderness.  No bladder fullness or masses.  Patient with circumcised/uncircumcised phallus. ***Foreskin easily retracted***  Urethral meatus is patent.  No penile discharge. No penile lesions or rashes. Scrotum without lesions, cysts, rashes and/or edema.  Testicles are located scrotally bilaterally. No masses are appreciated in the testicles. Left and right epididymis are normal. Rectal: Patient with  normal sphincter tone. Anus and perineum without scarring or rashes. No rectal masses are appreciated. Prostate is approximately *** grams, *** nodules are appreciated. Seminal vesicles are normal. Skin: No rashes, bruises or suspicious lesions. Lymph: No cervical or inguinal adenopathy. Neurologic: Grossly intact, no focal deficits, moving all 4 extremities. Psychiatric: Normal mood and affect.  Laboratory Data: Lab Results  Component Value Date   WBC 12.2 (H) 03/15/2019   HGB 13.5 03/15/2019   HCT 42.4 03/15/2019   MCV 85.0 03/15/2019   PLT 351 03/15/2019    Lab Results  Component Value Date   CREATININE 0.60 (L) 03/15/2019    Lab Results  Component  Value Date   PSA Normal 09/06/2013    Lab Results  Component Value Date   HGBA1C 6.1 (H) 04/12/2019    Lab Results  Component Value Date   TSH 3.71 10/23/2012       Component Value Date/Time   CHOL 164 03/13/2019 0210   CHOL 162 07/05/2016 0837   CHOL 160 08/29/2011 0453   HDL 26 (L) 03/13/2019 0210   HDL 30 (L) 07/05/2016 0837   HDL 18 (L) 08/29/2011 0453   CHOLHDL 6.3 03/13/2019 0210   VLDL 50 (H) 03/13/2019 0210   VLDL 55 (H) 08/29/2011 0453   LDLCALC 88 03/13/2019 0210   LDLCALC 111 (H) 06/09/2018 0829   LDLCALC 87 08/29/2011 0453    Lab Results  Component Value Date   AST 12 06/09/2018   Lab Results  Component Value Date   ALT 15 06/09/2018    Urinalysis Component     Latest Ref Rng & Units 08/08/2019          Specific Gravity, UA     1.005 - 1.030 1.015  pH, UA     5.0 - 7.5 7.5  Color, UA     Yellow Yellow  Appearance Ur     Clear Clear  Leukocytes,UA     Negative Negative  Protein,UA     Negative/Trace Negative  Glucose, UA     Negative Negative  Ketones, UA     Negative Negative  RBC, UA     Negative Negative  Bilirubin, UA     Negative Negative  Urobilinogen, Ur     0.2 - 1.0 mg/dL 0.2  Nitrite, UA     Negative Negative  Microscopic Examination      See below:   Component     Latest Ref Rng & Units 08/08/2019  WBC, UA     0 - 5 /hpf 0-5  RBC     0 - 2 /hpf 0-2  Epithelial Cells (non renal)     0 - 10 /hpf None seen  Bacteria, UA     None seen/Few None seen    I have reviewed the labs.   Pertinent Imaging: ***  Assessment & Plan:    1. Nephrolithiasis No recent episodes of renal colic, gross hematuria or passage of fragments We will continue to monitor clinically  2. BPH with LUTS IPSS score is 27/5 Continue conservative management, avoiding bladder irritants and timed voiding's Most bothersome symptoms is/are weak stream, which he contributes to being without his tamsulosin for 2 weeks and urgency which we  discussed patient undergoing cystoscopy for further evaluation or receive samples of Myrbetriq 25 mg, #25-he would like to try the samples prior to undergoing cystoscopy Continue tamsulosin 0.4 mg daily - refills given PSA pending RTC in 3 weeks for  I PSS and PVR and   3. Erectile dysfunction SHIM score is 21 Continue Sildenafil 20 mg, 3 to 5 tablets two hours prior to intercourse on an empty stomach, # 30; he is warned not to take medications that contain nitrates.  I also advised him of the side effects, such as: headache, flushing, dyspepsia, abnormal vision, nasal congestion, back pain, myalgia, nausea, dizziness, and rash. RTC in 12 months for repeat SHIM score and exam   4. Rectal bleeding Refer to gastroenterology for further evaluation  No follow-ups on file.  These notes generated with voice recognition software. I apologize for typographical errors.  Zara Council, PA-C  [facility] [address]  [place], [place] [address] [phone]

## 2019-08-29 ENCOUNTER — Ambulatory Visit: Payer: Self-pay | Admitting: Urology

## 2019-08-30 ENCOUNTER — Encounter: Payer: Self-pay | Admitting: Urology

## 2019-09-05 LAB — BASIC METABOLIC PANEL
Creatinine: 0.7 (ref 0.6–1.3)
Glucose: 118

## 2019-09-24 ENCOUNTER — Other Ambulatory Visit: Payer: Self-pay | Admitting: Family Medicine

## 2019-09-24 DIAGNOSIS — I252 Old myocardial infarction: Secondary | ICD-10-CM

## 2019-09-24 DIAGNOSIS — I209 Angina pectoris, unspecified: Secondary | ICD-10-CM

## 2019-09-24 DIAGNOSIS — I700 Atherosclerosis of aorta: Secondary | ICD-10-CM

## 2019-09-27 ENCOUNTER — Telehealth: Payer: Self-pay | Admitting: Family Medicine

## 2019-09-27 DIAGNOSIS — G43009 Migraine without aura, not intractable, without status migrainosus: Secondary | ICD-10-CM

## 2019-09-27 NOTE — Telephone Encounter (Signed)
Requested medication (s) are due for refill today: no  Requested medication (s) are on the active medication list: yes  Last refill:  05/08/2019  Future visit scheduled: yes  Notes to clinic: script was last filled 4 months ago Review for use   Requested Prescriptions  Pending Prescriptions Disp Refills   ketorolac (TORADOL) 10 MG tablet [Pharmacy Med Name: KETOROLAC 10 MG TABLET] 30 tablet 0    Sig: Take 1 tablet (10 mg total) by mouth every 6 (six) hours as needed.      Analgesics:  NSAIDS Failed - 09/27/2019  8:45 AM      Failed - Cr in normal range and within 360 days    Creat  Date Value Ref Range Status  06/09/2018 0.67 (L) 0.70 - 1.33 mg/dL Final    Comment:    For patients >54 years of age, the reference limit for Creatinine is approximately 13% higher for people identified as African-American. .    Creatinine, Ser  Date Value Ref Range Status  03/15/2019 0.60 (L) 0.61 - 1.24 mg/dL Final          Passed - HGB in normal range and within 360 days    Hemoglobin  Date Value Ref Range Status  03/15/2019 13.5 13.0 - 17.0 g/dL Final  07/05/2016 12.3 (L) 13.0 - 17.7 g/dL Final          Passed - Patient is not pregnant      Passed - Valid encounter within last 12 months    Recent Outpatient Visits           2 months ago Dyslipidemia associated with type 2 diabetes mellitus [facility])   [facility] [place], Drue Stager, MD   5 months ago Dyslipidemia associated with type 2 diabetes mellitus [facility])   [facility] Steele Sizer, MD   6 months ago Hospital discharge follow-up   [facility] Steele Sizer, MD   11 months ago Atherosclerosis of aorta [facility])   [facility] Steele Sizer, MD   1 year ago Dyslipidemia associated with type 2 diabetes mellitus [facility])   [facility] Steele Sizer, MD       Future Appointments             In 1 week Steele Sizer, MD [facility], [facility]

## 2019-10-01 ENCOUNTER — Other Ambulatory Visit: Payer: Self-pay | Admitting: Family Medicine

## 2019-10-01 DIAGNOSIS — G43009 Migraine without aura, not intractable, without status migrainosus: Secondary | ICD-10-CM

## 2019-10-01 MED ORDER — KETOROLAC TROMETHAMINE 10 MG PO TABS: 10.0000 mg | ORAL_TABLET | Freq: Four times a day (QID) | ORAL | 0 refills | Status: DC | PRN

## 2019-10-01 NOTE — Telephone Encounter (Signed)
Pt already have an appt scheduled for this month. He does not understand why his medication cannot be refilled. Stated that he is taking ibuprofen and its not helping. Please advise.

## 2019-10-01 NOTE — Telephone Encounter (Signed)
Patient called.  Patient aware.  

## 2019-10-08 ENCOUNTER — Other Ambulatory Visit: Payer: Self-pay | Admitting: Family Medicine

## 2019-10-08 DIAGNOSIS — I252 Old myocardial infarction: Secondary | ICD-10-CM

## 2019-10-08 DIAGNOSIS — I10 Essential (primary) hypertension: Secondary | ICD-10-CM

## 2019-10-09 NOTE — Progress Notes (Signed)
Name: Tim Anderson   MRN: [id number]    DOB: [birth year]   Date:10/10/2019       Progress Note  Subjective  Chief Complaint  Chief Complaint  Patient presents with  . Diabetes  . Dyslipidemia  . Hypertension  . Hyperlipidemia  . Immunizations    HPI  Angina he states had a stress test in October2018.He had MI on 02/2019 it was  NSTEMI  he had a cardiac cath but stent was not placed, he is under the care of Dr. Nehemiah Massed, he tried Atorvastatin, Crestor and Pravastatin but stopped it because of different side effects, we will try zetia ( discussed Good RX ) he is compliant with Plavix, beta blocker . He has fatigue but stable, no decrease in exercise tolerance, he took a NTG last week, but pain did improve, it is dull and aching on left upper chest , he has some pain during touch, it is mild , not associated with sob or diaphoresis.   GERD: he is on medication and symptoms are controlledhe has been taking medication daily, states when skipping symptoms returns. Denies heartburn or indigestion at this time  OSA:  He  has a new mask and stars with CPAP every night but removes during the night, keeping it on for about 3 hours   Migraine/headaches: he has dull ache/tension type headachedull frontal headacheat least once a week, he takes Toradol prn for that, last rx lasted over 6 months .No recent episodes of migraine.  RA: sees Rheumatologist, off Enbrelon Plaquenilgiven by Dr. Jefm Bryant.He is now on hydrochloroquine. He states he has swelling on both hands - fingers and MCP joints at the end of the day, he denies increase in warmth or redness . REviewed recent labs done at [facility]   HTN:  BP is at goal today, he states dizziness not as often only when bends over and gets up quickly, no diaphoresis or decrease in exercise tolerance, denies palpitation   DMII: diagnosed Nov 2016, he is on diet only, gained a lot of weight when he  quit smoking 11/2016, he resumed  smoking 2020  quit right after heart attack in January 2021 but after 6 weeks he resumed smoking again, he is smoking a few cigarettes daily, explained importance of quitting.He denies polyphagia, polydipsia, polyuria..Last A1C was at goal at 6.1 % and is normal today at 5.6 % . We will change from DM to history of diabetes and continue to monitor once or twice a year  Hyperlipidemia: hewas  on Lipitor since 02/2019 after MI, however stopped because of muscle aches, explained importance of statin therapy , we tried Pravastatin but also unable tolerate it , we will send rx of zetia goal LDL is below 70  Atherosclerosis of aorta: on plavix but off statin therapy, we will try zetia   Blood in stools: it happened a couple of months ago, discussed with urologist, he has a follow up with gastroenterologist coming up. No change in bowel movements and not recent bleeding, advised to keep follow up  Patient Active Problem List   Diagnosis Date Noted  . Coronary artery disease involving native coronary artery of native heart 04/04/2019  . History of non-ST elevation myocardial infarction (NSTEMI) 03/12/2019  . 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 Atherosclerosis of aorta ([place]) 06/09/2018  . Mediastinal lymphadenopathy 06/09/2018  . Lung nodules 06/09/2018  . Arthralgia of left temporomandibular joint 12/08/2017  . Mucopurulent chronic bronchitis ([place]) 12/08/2017  . Mild  left ventricular systolic dysfunction 84/13/2440  . Grade II diastolic dysfunction 12/12/2534  . Mild mitral regurgitation 07/05/2017  . Mild tricuspid regurgitation 07/05/2017  . History of left hip replacement 12/27/2016  . Angina pectoris ([place]) 10/27/2016  . Carpal tunnel syndrome on both sides 04/07/2016  . Primary osteoarthritis involving multiple joints 07/29/2015  . Arthritis of knee, degenerative 02/03/2015  . Benign prostatic hyperplasia 09/28/2014  . Chronic obstructive  pulmonary disease ([place]) 09/28/2014  . Drug abuse ([place]) 09/28/2014  . Deflected nasal septum 09/28/2014  . Dyslipidemia 09/28/2014  . Dysmetabolic syndrome 64/40/3474  . GERD (gastroesophageal reflux disease) 09/28/2014  . H/O renal calculi 09/28/2014  . HTN (hypertension) 09/28/2014  . Male hypogonadism 09/28/2014  . Failure of erection 09/28/2014  . Obstructive sleep apnea syndrome 09/28/2014  . Depression with anxiety 09/28/2014  . Obesity (BMI 30-39.9) 09/28/2014  . Migraine without aura and responsive to treatment 09/28/2014  . Perennial allergic rhinitis with seasonal variation 09/28/2014  . Elevated platelet count 09/28/2014  . Tobacco abuse 09/28/2014  . Cerebrovascular accident (CVA) ([place]) 07/27/2013  . History of TIA (transient ischemic attack) 09/20/2011  . Rheumatoid arthritis involving multiple joints ([place]) 07/31/2009    Past Surgical History:  Procedure Laterality Date  . CORONARY BALLOON ANGIOPLASTY N/A 03/14/2019   Procedure: CORONARY BALLOON ANGIOPLASTY;  Surgeon: Yolonda Kida, MD;  Location: [facility];  Service: Cardiovascular;  Laterality: N/A;  . FACIAL RECONSTRUCTION SURGERY    . JOINT REPLACEMENT Left    hip  . JOINT REPLACEMENT Right    knee  . KNEE SURGERY Right   . LEFT HEART CATH AND CORONARY ANGIOGRAPHY N/A 03/14/2019   Procedure: LEFT HEART CATH AND CORONARY ANGIOGRAPHY;  Surgeon: Corey Skains, MD;  Location: [facility];  Service: Cardiovascular;  Laterality: N/A;    Family History  Problem Relation Age of Onset  . Diabetes Mother   . Diabetes Father   . Heart attack Father   . Cancer Brother        Skin and Prostate-Oldest Brother  . Diabetes Brother     Social History   Tobacco Use  . Smoking status: Current Some Day Smoker    Packs/day: 1.00    Years: 30.00    Pack years: 30.00    Types: Cigarettes    Start date: 09/30/1976  . Smokeless tobacco: Never Used  Substance Use Topics  . Alcohol use: Yes     Alcohol/week: 4.0 standard drinks    Types: 4 Cans of beer per week    Comment: daily     Current Outpatient Medications:  .  amLODipine-benazepril (LOTREL) 5-20 MG capsule, Take 1 capsule by mouth daily. (Patient taking differently: Take 1 capsule by mouth at bedtime. ), Disp: 90 capsule, Rfl: 1 .  aspirin EC 81 MG tablet, Take 81 mg by mouth. Reported on 07/29/2015, Disp: , Rfl:  .  carvedilol (COREG) 25 MG tablet, TAKE ONE TABLET TWICE A DAY WITH FOOD., Disp: 180 tablet, Rfl: 0 .  clopidogrel (PLAVIX) 75 MG tablet, Take 1 tablet (75 mg total) by mouth daily., Disp: 90 tablet, Rfl: 1 .  Fluticasone-Umeclidin-Vilant (TRELEGY ELLIPTA IN), Inhale 1 puff into the lungs daily., Disp: , Rfl:  .  hydroxychloroquine (PLAQUENIL) 200 MG tablet, Take 400 tablets by mouth at bedtime. , Disp: , Rfl:  .  ketorolac (TORADOL) 10 MG tablet, Take 1 tablet (10 mg total) by mouth every 6 (six) hours as needed., Disp: 30 tablet, Rfl: 0 .  mirabegron ER (MYRBETRIQ) 25 MG TB24 tablet, Take 1 tablet (25 mg total) by mouth daily., Disp: 28 tablet, Rfl: 0 .  nitroGLYCERIN (NITROSTAT) 0.4 MG SL tablet, Place 1 tablet (0.4 mg total) under the tongue every 5 (five) minutes x 3 doses as needed for chest pain., Disp: 100 tablet, Rfl: 0 .  pantoprazole (PROTONIX) 20 MG tablet, Take 1 tablet (20 mg total) by mouth daily., Disp: 90 tablet, Rfl: 1 .  promethazine (PHENERGAN) 12.5 MG tablet, Take 1 tablet (12.5 mg total) by mouth every 8 (eight) hours as needed for nausea or vomiting., Disp: 20 tablet, Rfl: 0 .  rizatriptan (MAXALT) 10 MG tablet, Take 1 tablet (10 mg total) by mouth as needed for migraine., Disp: 10 tablet, Rfl: 0 .  sildenafil (REVATIO) 20 MG tablet, Take 3 to 5 tablets two hours before intercouse on an empty stomach.  Do not take with nitrates., Disp: 30 tablet, Rfl: 0 .  tamsulosin (FLOMAX) 0.4 MG CAPS capsule, Take 1 capsule (0.4 mg total) by mouth daily., Disp: 90 capsule, Rfl: 3 .  pravastatin (PRAVACHOL)  40 MG tablet, Take 1 tablet (40 mg total) by mouth daily. In placeof atorvastatin (Patient not taking: Reported on 10/10/2019), Disp: 30 tablet, Rfl: 0  Allergies  Allergen Reactions  . Ciprofloxacin Other (See Comments)    Body aches  . Crestor [Rosuvastatin Calcium] Other (See Comments)    Muscles aches  . Droperidol Other (See Comments)    Heart stopped  . Lipofen  [Fenofibrate]     muscle aches muscle aches  . Niacin Er     flushed    I personally reviewed active problem list, medication list, allergies, family history, social history, health maintenance with the patient/caregiver today.   ROS  Constitutional: Negative for fever or weight change.  Respiratory: Negative for cough and shortness of breath.   Cardiovascular: Negative for chest pain or palpitations.  Gastrointestinal: Negative for abdominal pain, no bowel changes.  Musculoskeletal: Negative for gait problem or joint swelling.  Skin: Negative for rash.  Neurological: Negative for dizziness or headache.  No other specific complaints in a complete review of systems (except as listed in HPI above).  Objective  Vitals:   10/10/19 0804  BP: 130/60  Pulse: 81  Resp: 16  Temp: 97.8 F (36.6 C)  TempSrc: Oral  SpO2: 99%  Weight: 213 lb 11.2 oz (96.9 kg)  Height: 5\' 7"  (1.702 m)    Body mass index is 33.47 kg/m.  Physical Exam  Constitutional: Patient appears well-developed and well-nourished.  No distress.  HEENT: head atraumatic, normocephalic, pupils equal and reactive to light,neck supple Cardiovascular: Normal rate, regular rhythm and normal heart sounds.  No murmur heard. No BLE edema. Pulmonary/Chest: Effort normal and breath sounds normal. No respiratory distress. Abdominal: Soft.  There is no tenderness. Muscular Skeletal: no synovitis  Psychiatric: Patient has a normal mood and affect. behavior is normal. Judgment and thought content normal.  Recent Results (from the past 2160 hour(s))   Urinalysis, Complete     Status: None   Collection Time: 08/08/19  9:53 AM  Result Value Ref Range   Specific Gravity, UA 1.015 1.005 - 1.030   pH, UA 7.5 5.0 - 7.5   Color, UA Yellow Yellow   Appearance Ur Clear Clear   Leukocytes,UA Negative Negative   Protein,UA Negative Negative/Trace   Glucose, UA Negative Negative   Ketones, UA Negative Negative   RBC, UA Negative Negative   Bilirubin, UA Negative Negative  Urobilinogen, Ur 0.2 0.2 - 1.0 mg/dL   Nitrite, UA Negative Negative   Microscopic Examination See below:   Microscopic Examination     Status: None   Collection Time: 08/08/19  9:53 AM   Urine  Result Value Ref Range   WBC, UA 0-5 0 - 5 /hpf   RBC 0-2 0 - 2 /hpf   Epithelial Cells (non renal) None seen 0 - 10 /hpf   Bacteria, UA None seen None seen/Few  BLADDER SCAN AMB NON-IMAGING     Status: None   Collection Time: 08/08/19  9:57 AM  Result Value Ref Range   Scan Result 80ml   PSA     Status: None   Collection Time: 08/08/19 11:53 AM  Result Value Ref Range   Prostate Specific Ag, Serum 1.5 0.0 - 4.0 ng/mL    Comment: Roche ECLIA methodology. According to the American Urological Association, Serum PSA should decrease and remain at undetectable levels after radical prostatectomy. The AUA defines biochemical recurrence as an initial PSA value 0.2 ng/mL or greater followed by a subsequent confirmatory PSA value 0.2 ng/mL or greater. Values obtained with different assay methods or kits cannot be used interchangeably. Results cannot be interpreted as absolute evidence of the presence or absence of malignant disease.   POCT HgB A1C     Status: Normal   Collection Time: 10/10/19  8:15 AM  Result Value Ref Range   Hemoglobin A1C 5.6 4.0 - 5.6 %   HbA1c POC (<> result, manual entry)     HbA1c, POC (prediabetic range)     HbA1c, POC (controlled diabetic range)        PHQ2/9: Depression screen [facility] 2/9 10/10/2019 07/10/2019 04/12/2019 03/22/2019 10/10/2018   Decreased Interest 0 0 0 2 0  Down, Depressed, Hopeless 0 0 0 0 0  PHQ - 2 Score 0 0 0 2 0  Altered sleeping 1 1 0 3 0  Tired, decreased energy 2 1 0 3 0  Change in appetite 2 0 0 2 0  Feeling bad or failure about yourself  1 0 0 1 0  Trouble concentrating 0 0 0 2 0  Moving slowly or fidgety/restless 0 0 0 0 0  Suicidal thoughts 0 0 0 0 0  PHQ-9 Score 6 2 0 13 0  Difficult doing work/chores Somewhat difficult Not difficult at all - Somewhat difficult Not difficult at all  Some recent data might be hidden    phq 9 is positive   Fall Risk: Fall Risk  10/10/2019 07/10/2019 04/12/2019 03/22/2019 10/10/2018  Falls in the past year? 1 1 0 0 1  Number falls in past yr: 1 1 0 0 1  Injury with Fall? 0 1 0 0 0  Risk for fall due to : - History of fall(s) - - History of fall(s)  Follow up - Falls evaluation completed - - Falls evaluation completed     Functional Status Survey: Is the patient deaf or have difficulty hearing?: No Does the patient have difficulty seeing, even when wearing glasses/contacts?: No Does the patient have difficulty concentrating, remembering, or making decisions?: No Does the patient have difficulty walking or climbing stairs?: Yes Does the patient have difficulty dressing or bathing?: Yes Does the patient have difficulty doing errands alone such as visiting a doctor's office or shopping?: No    Assessment & Plan  1. Hyperglycemia  - POCT HgB A1C - Urine Microalbumin w/creat. ratio  2. Need for immunization against influenza  -  Flu Vaccine QUAD 36+ mos IM  3. Angina pectoris (HCC)  - clopidogrel (PLAVIX) 75 MG tablet; Take 1 tablet (75 mg total) by mouth daily.  Dispense: 90 tablet; Refill: 1  4. Mucopurulent chronic bronchitis ([place])   5. Gastroesophageal reflux disease without esophagitis  - pantoprazole (PROTONIX) 20 MG tablet; Take 1 tablet (20 mg total) by mouth daily.  Dispense: 90 tablet; Refill: 1  6. Rheumatoid arthritis involving multiple  joints (HCC)  Keep follow up with Dr. Jefm Bryant   7. Atherosclerosis of aorta (HCC)  - ezetimibe (ZETIA) 10 MG tablet; Take 1 tablet (10 mg total) by mouth daily.  Dispense: 90 tablet; Refill: 3 - clopidogrel (PLAVIX) 75 MG tablet; Take 1 tablet (75 mg total) by mouth daily.  Dispense: 90 tablet; Refill: 1  8. Obstructive apnea  Doing better   9. Primary osteoarthritis involving multiple joints  Stable  10. History of MI (myocardial infarction)  - clopidogrel (PLAVIX) 75 MG tablet; Take 1 tablet (75 mg total) by mouth daily.  Dispense: 90 tablet; Refill: 1  11. Migraine without aura and responsive to treatment  - rizatriptan (MAXALT) 10 MG tablet; Take 1 tablet (10 mg total) by mouth as needed for migraine.  Dispense: 10 tablet; Refill: 0  12. Essential hypertension  At goal   13. History of diabetes mellitus, type II   14. Statin intolerance

## 2019-10-10 ENCOUNTER — Encounter: Payer: Self-pay | Admitting: Family Medicine

## 2019-10-10 ENCOUNTER — Other Ambulatory Visit: Payer: Self-pay | Admitting: Family Medicine

## 2019-10-10 ENCOUNTER — Ambulatory Visit: Payer: Self-pay | Admitting: Family Medicine

## 2019-10-10 ENCOUNTER — Other Ambulatory Visit: Payer: Self-pay

## 2019-10-10 VITALS — BP 130/60 | HR 81 | Temp 97.8°F | Resp 16 | Ht 67.0 in | Wt 213.7 lb

## 2019-10-10 DIAGNOSIS — M8949 Other hypertrophic osteoarthropathy, multiple sites: Secondary | ICD-10-CM

## 2019-10-10 DIAGNOSIS — R739 Hyperglycemia, unspecified: Secondary | ICD-10-CM

## 2019-10-10 DIAGNOSIS — I209 Angina pectoris, unspecified: Secondary | ICD-10-CM

## 2019-10-10 DIAGNOSIS — Z23 Encounter for immunization: Secondary | ICD-10-CM

## 2019-10-10 DIAGNOSIS — M069 Rheumatoid arthritis, unspecified: Secondary | ICD-10-CM

## 2019-10-10 DIAGNOSIS — I700 Atherosclerosis of aorta: Secondary | ICD-10-CM

## 2019-10-10 DIAGNOSIS — G43009 Migraine without aura, not intractable, without status migrainosus: Secondary | ICD-10-CM

## 2019-10-10 DIAGNOSIS — K219 Gastro-esophageal reflux disease without esophagitis: Secondary | ICD-10-CM

## 2019-10-10 DIAGNOSIS — I10 Essential (primary) hypertension: Secondary | ICD-10-CM

## 2019-10-10 DIAGNOSIS — Z789 Other specified health status: Secondary | ICD-10-CM

## 2019-10-10 DIAGNOSIS — E669 Obesity, unspecified: Secondary | ICD-10-CM | POA: Insufficient documentation

## 2019-10-10 DIAGNOSIS — Z8639 Personal history of other endocrine, nutritional and metabolic disease: Secondary | ICD-10-CM | POA: Insufficient documentation

## 2019-10-10 DIAGNOSIS — J411 Mucopurulent chronic bronchitis: Secondary | ICD-10-CM

## 2019-10-10 DIAGNOSIS — I252 Old myocardial infarction: Secondary | ICD-10-CM

## 2019-10-10 DIAGNOSIS — M159 Polyosteoarthritis, unspecified: Secondary | ICD-10-CM

## 2019-10-10 DIAGNOSIS — G4733 Obstructive sleep apnea (adult) (pediatric): Secondary | ICD-10-CM

## 2019-10-10 DIAGNOSIS — E1169 Type 2 diabetes mellitus with other specified complication: Secondary | ICD-10-CM | POA: Insufficient documentation

## 2019-10-10 LAB — POCT GLYCOSYLATED HEMOGLOBIN (HGB A1C): Hemoglobin A1C: 5.6 % (ref 4.0–5.6)

## 2019-10-10 MED ORDER — RIZATRIPTAN BENZOATE 10 MG PO TABS: 10.0000 mg | ORAL_TABLET | ORAL | 0 refills | Status: DC | PRN

## 2019-10-10 MED ORDER — EZETIMIBE 10 MG PO TABS: 10.0000 mg | ORAL_TABLET | Freq: Every day | ORAL | 3 refills | Status: DC

## 2019-10-10 MED ORDER — CLOPIDOGREL BISULFATE 75 MG PO TABS: 75.0000 mg | ORAL_TABLET | Freq: Every day | ORAL | 1 refills | Status: DC

## 2019-10-10 MED ORDER — PANTOPRAZOLE SODIUM 20 MG PO TBEC: 20.0000 mg | DELAYED_RELEASE_TABLET | Freq: Every day | ORAL | 1 refills | Status: DC

## 2019-10-11 LAB — MICROALBUMIN / CREATININE URINE RATIO
Creatinine, Urine: 97 mg/dL (ref 20–320)
Microalb Creat Ratio: 19 mcg/mg creat (ref <30)
Microalb, Ur: 1.8 mg/dL

## 2019-10-19 ENCOUNTER — Ambulatory Visit: Payer: Self-pay | Admitting: Gastroenterology

## 2019-11-06 ENCOUNTER — Other Ambulatory Visit: Payer: Self-pay | Admitting: Family Medicine

## 2019-11-06 DIAGNOSIS — G43009 Migraine without aura, not intractable, without status migrainosus: Secondary | ICD-10-CM

## 2019-11-06 NOTE — Telephone Encounter (Signed)
Requested medication (s) are due for refill today - no  Requested medication (s) are on the active medication list -yes  Future visit scheduled -yes  Last refill: 10/10/19  Notes to clinic: Rx for 10 pills- ? Too soon for RF  Requested Prescriptions  Pending Prescriptions Disp Refills   rizatriptan (MAXALT) 10 MG tablet [Pharmacy Med Name: RIZATRIPTAN 10 MG TABLET] 10 tablet 0    Sig: Take 1 tablet (10 mg total) by mouth as needed for migraine.      Neurology:  Migraine Therapy - Triptan Passed - 11/06/2019  2:16 PM      Passed - Last BP in normal range    BP Readings from Last 1 Encounters:  10/10/19 130/60          Passed - Valid encounter within last 12 months    Recent Outpatient Visits           3 weeks ago Hyperglycemia   [facility] [place], Drue Stager, MD   3 months ago Dyslipidemia associated with type 2 diabetes mellitus [facility])   [facility] [place], Drue Stager, MD   6 months ago Dyslipidemia associated with type 2 diabetes mellitus [facility])   [facility] Steele Sizer, MD   7 months ago Hospital discharge follow-up   [facility] Steele Sizer, MD   1 year ago Atherosclerosis of aorta [facility])   [facility] Steele Sizer, MD       Future Appointments             In 5 months Steele Sizer, MD [facility], [facility]                Requested Prescriptions  Pending Prescriptions Disp Refills   rizatriptan (MAXALT) 10 MG tablet [Pharmacy Med Name: RIZATRIPTAN 10 MG TABLET] 10 tablet 0    Sig: Take 1 tablet (10 mg total) by mouth as needed for migraine.      Neurology:  Migraine Therapy - Triptan Passed - 11/06/2019  2:16 PM      Passed - Last BP in normal range    BP Readings from Last 1 Encounters:  10/10/19 130/60          Passed - Valid encounter within last 12 months    Recent Outpatient Visits           3 weeks ago Hyperglycemia    [facility] [place], Drue Stager, MD   3 months ago Dyslipidemia associated with type 2 diabetes mellitus [facility])   [facility] Steele Sizer, MD   6 months ago Dyslipidemia associated with type 2 diabetes mellitus [facility])   [facility] Steele Sizer, MD   7 months ago Hospital discharge follow-up   [facility] Steele Sizer, MD   1 year ago Atherosclerosis of aorta [facility])   [facility] Steele Sizer, MD       Future Appointments             In 5 months Ancil Boozer, Drue Stager, MD [facility], [facility]

## 2019-12-03 ENCOUNTER — Other Ambulatory Visit: Payer: Self-pay | Admitting: Family Medicine

## 2019-12-03 DIAGNOSIS — G43009 Migraine without aura, not intractable, without status migrainosus: Secondary | ICD-10-CM

## 2019-12-11 ENCOUNTER — Encounter: Payer: Self-pay | Admitting: Primary Care

## 2019-12-31 ENCOUNTER — Other Ambulatory Visit: Payer: Self-pay

## 2020-01-02 ENCOUNTER — Ambulatory Visit: Payer: Self-pay | Admitting: Gastroenterology

## 2020-01-02 ENCOUNTER — Encounter: Payer: Self-pay | Admitting: Gastroenterology

## 2020-01-03 ENCOUNTER — Encounter: Payer: Self-pay | Admitting: Urology

## 2020-01-09 ENCOUNTER — Other Ambulatory Visit: Payer: Self-pay | Admitting: Urology

## 2020-01-11 ENCOUNTER — Other Ambulatory Visit: Payer: Self-pay | Admitting: Family Medicine

## 2020-01-11 DIAGNOSIS — I10 Essential (primary) hypertension: Secondary | ICD-10-CM

## 2020-01-11 DIAGNOSIS — G43009 Migraine without aura, not intractable, without status migrainosus: Secondary | ICD-10-CM

## 2020-01-11 DIAGNOSIS — I252 Old myocardial infarction: Secondary | ICD-10-CM

## 2020-01-15 ENCOUNTER — Telehealth: Payer: Self-pay | Admitting: Urology

## 2020-01-15 NOTE — Telephone Encounter (Signed)
Tim Anderson is requesting a refill on his sildenafil, but it looks like his cardiologist discontinued the medication.  I will need their okay to refill the sildenafil.

## 2020-01-15 NOTE — Telephone Encounter (Signed)
LMOM for patient to return call.

## 2020-01-15 NOTE — Telephone Encounter (Signed)
I need documentation from Dr. Alveria Apley office stating it is okay to refill the sildenafil

## 2020-01-15 NOTE — Telephone Encounter (Signed)
Clearance request faxed

## 2020-01-15 NOTE — Telephone Encounter (Signed)
Incoming call from pt on triage line stating that he has been cleared to use Sildenafil, he states that Dr. Nehemiah Massed is aware of him using the medication and has even prescribed for the pt in the past.

## 2020-01-16 IMAGING — CT CT RENAL STONE PROTOCOL
2 of 4 series · 16 of 46 positions shown, 18 images · non-contrast
Comparison: Abdomen and pelvis radiographs obtained earlier today.
Abdomen and pelvis CT dated 03/04/2016.

CLINICAL DATA: Left flank pain and swelling for the past 5 days,
worse since last night. Some decreased urine output. History of
kidney stones.

EXAM:
CT ABDOMEN AND PELVIS WITHOUT CONTRAST
TECHNIQUE: Multidetector CT imaging of the abdomen and pelvis was performed
following the standard protocol without IV contrast.

[Series 2: stone full standard (person_name) · axial · 0.79mm/px · z∈[-498,-48]mm · 13 of 100 slices shown, 15 images]
[im 5/100  soft-tissue]
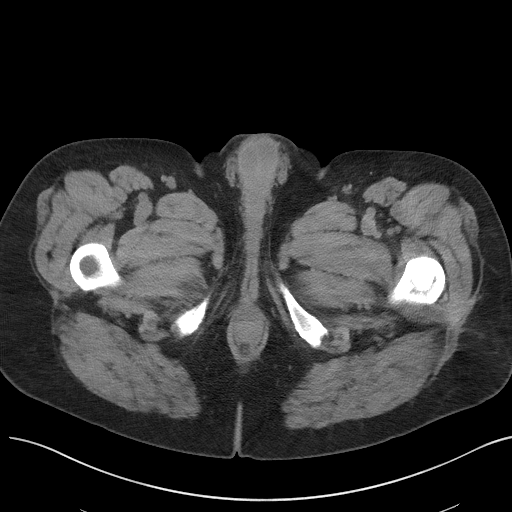
[im 5/100  bone]
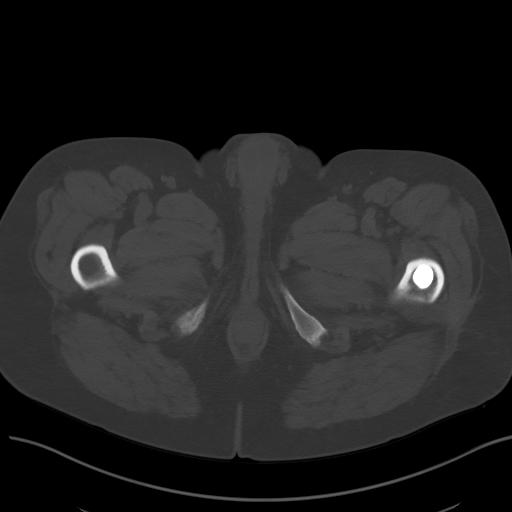
[im 13/100  soft-tissue]
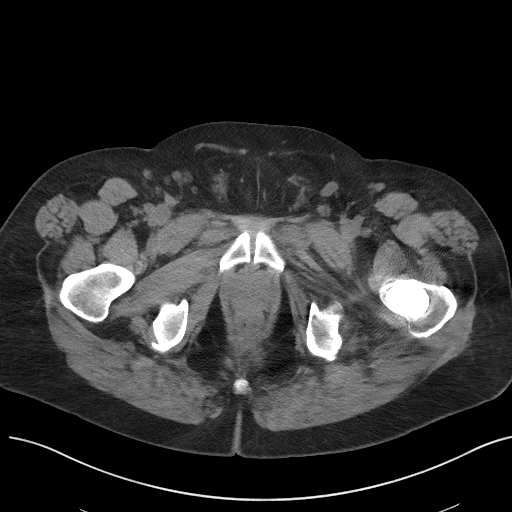
[im 21/100  soft-tissue]
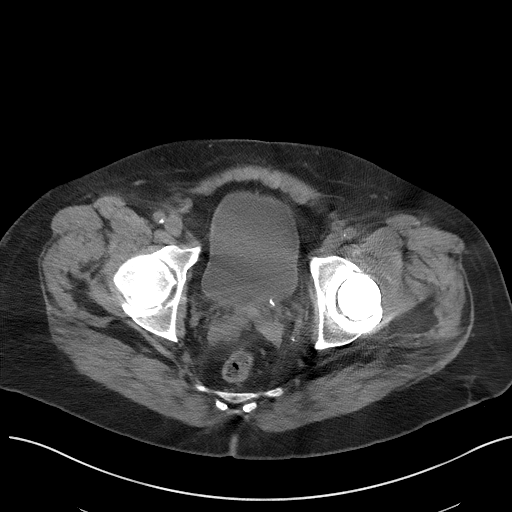
[im 29/100  soft-tissue]
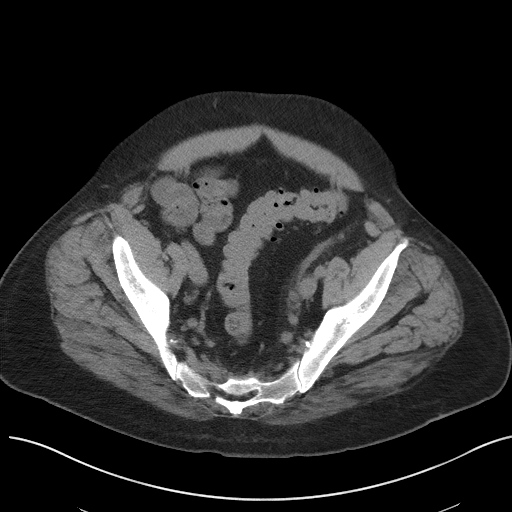
[im 34/100  soft-tissue]
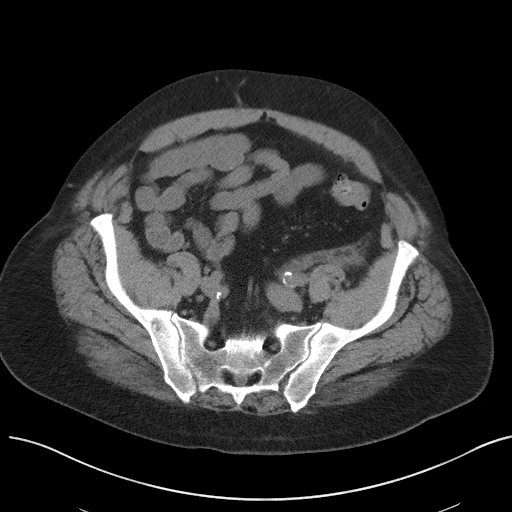
[im 42/100  soft-tissue]
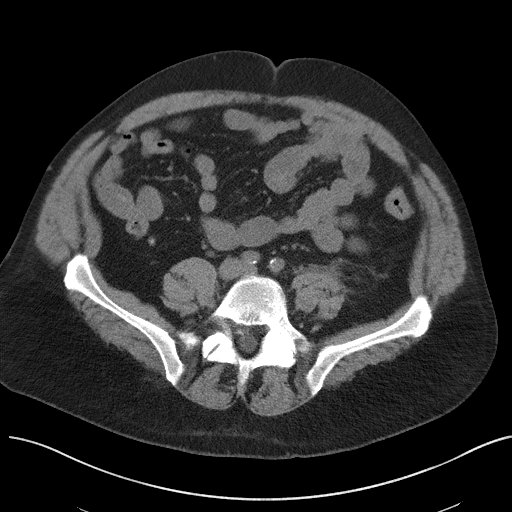
[im 50/100  soft-tissue]
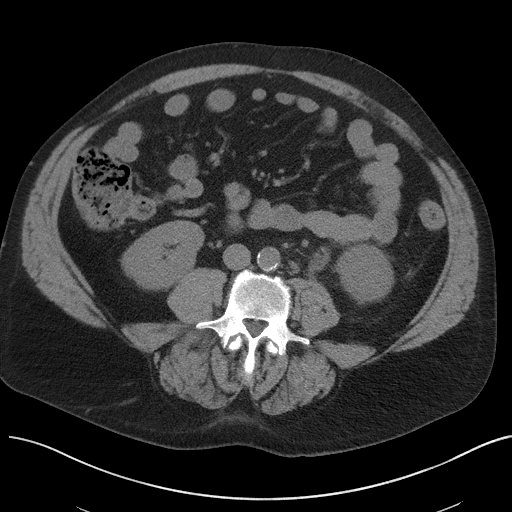
[im 58/100  soft-tissue]
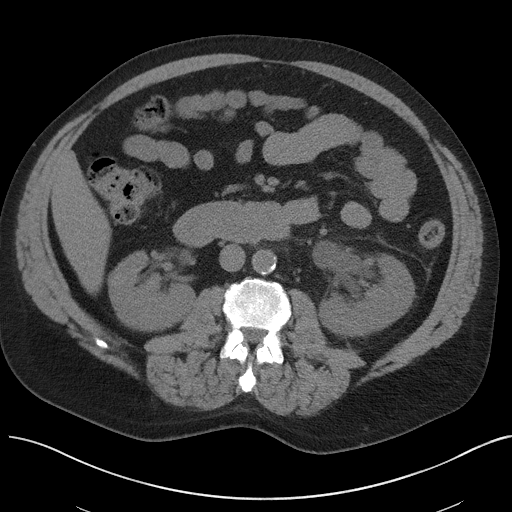
[im 67/100  soft-tissue]
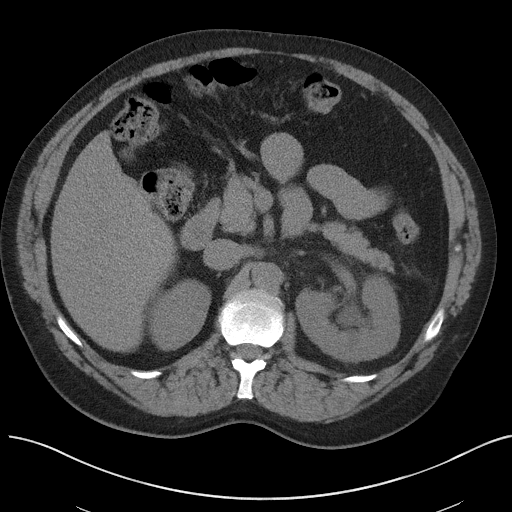
[im 67/100  bone]
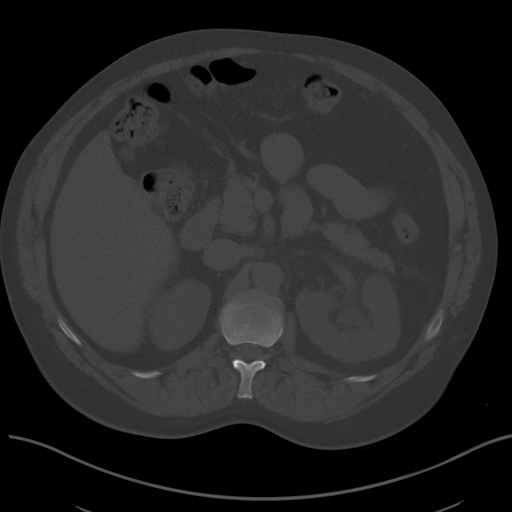
[im 71/100  soft-tissue]
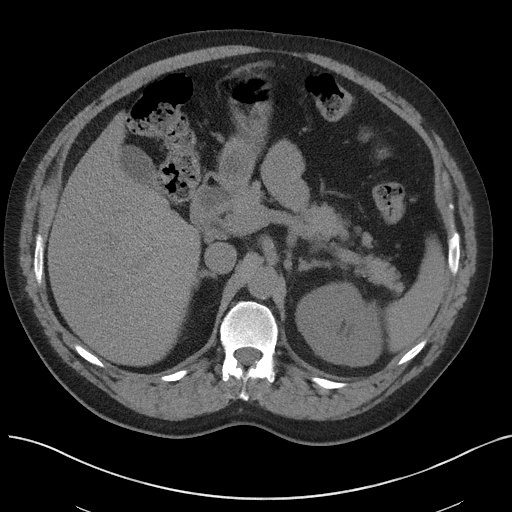
[im 79/100  soft-tissue]
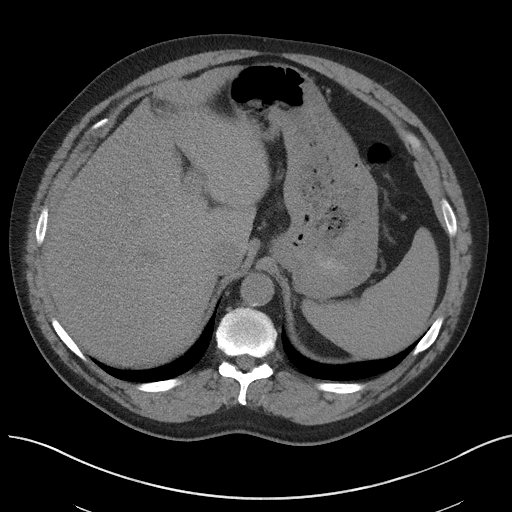
[im 87/100  soft-tissue]
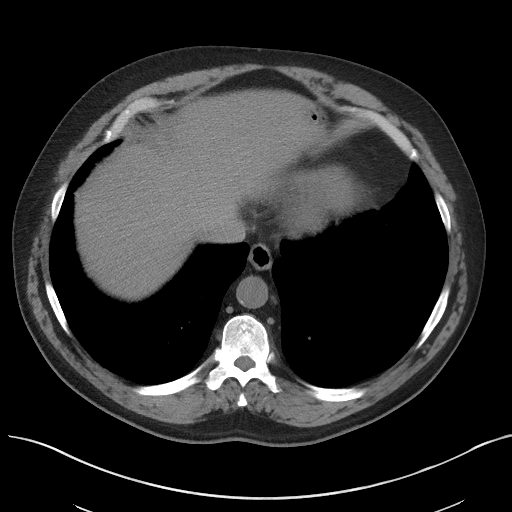
[im 95/100  soft-tissue]
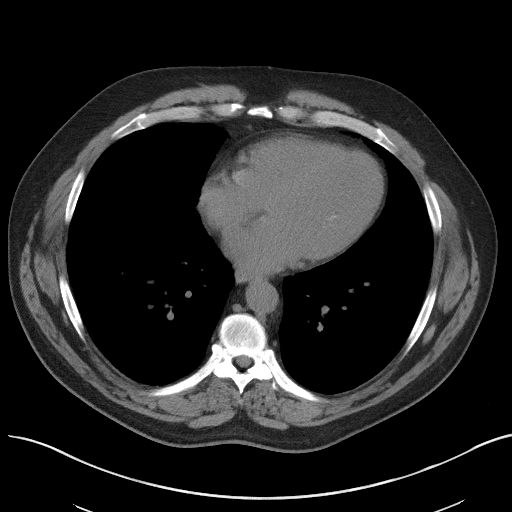

[Series 5: coronal · coronal · 0.86mm/px · 3 of 171 slices shown]
[im 57/171  soft-tissue]
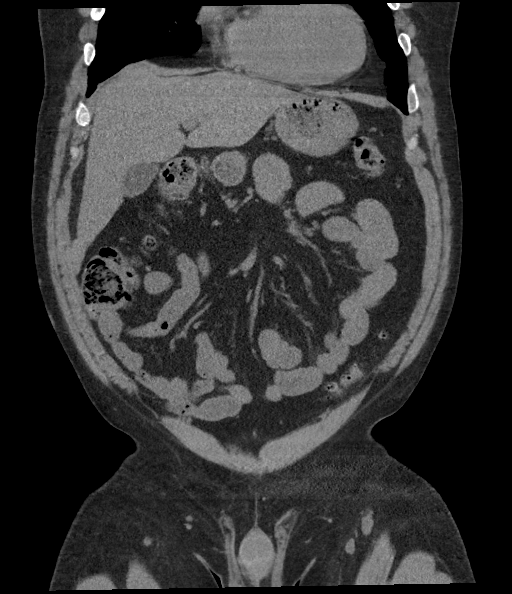
[im 76/171  soft-tissue]
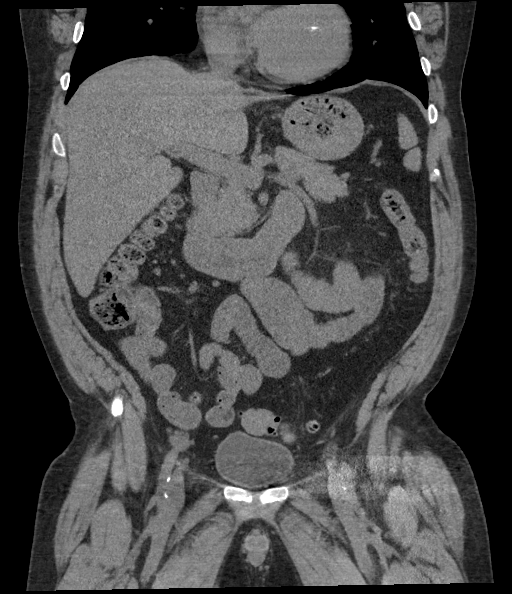
[im 95/171  soft-tissue]
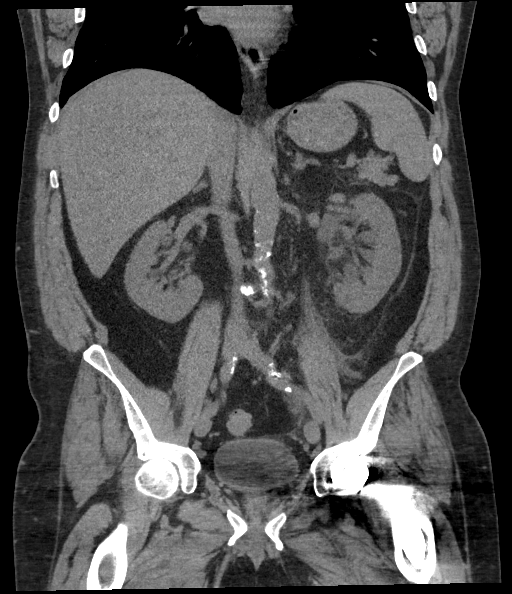

[16 of 46 positions shown; findings below may reference images not displayed]

FINDINGS: Lower chest: Interval 5 mm nodular density in the right lower lobe
on image number 9 series 4.

Hepatobiliary: No focal liver abnormality is seen. No gallstones,
gallbladder wall thickening, or biliary dilatation.

Pancreas: Unremarkable. No pancreatic ductal dilatation or
surrounding inflammatory changes.

Spleen: Normal in size without focal abnormality.

Adrenals/Urinary Tract: Normal appearing adrenal glands, right
kidney, right ureter and urinary bladder. Moderate dilatation of the
left renal collecting system and ureter to the level of a 6 mm
calculus or 2 adjacent calculi at the ureterovesical junction.
Associated periureteric soft tissue stranding and edema. Minimal
perinephric soft tissue stranding. Tiny mid left renal calculus.

Stomach/Bowel: Multiple sigmoid and descending colon diverticula.
Multiple small appendicoliths. Otherwise, normal appearing appendix.
Normal appearing small bowel and stomach.

Vascular/Lymphatic: Atheromatous arterial calcifications without
aneurysm. No enlarged lymph nodes.

Reproductive: Minimally enlarged prostate gland containing punctate
calcifications.

Other: No abdominal wall hernia or abnormality. No abdominopelvic
ascites.

Musculoskeletal: Left hip prosthesis. Lumbar spine degenerative
changes. These include facet degenerative changes at the L5-S1 level
associated with mild, grade 1 anterolisthesis
IMPRESSION: 1. 6 mm distal left ureteral calculus or 2 adjacent calculi at the
ureterovesical junction, causing moderate left hydronephrosis and
hydroureter.
2. Tiny, nonobstructing mid left renal calculus.
3. Interval 5 mm nodular density in the right lower lobe. A
follow-up chest CT without contrast is recommended in 6 months.
4. Extensive sigmoid and descending colon diverticulosis.
5. Mild prostatic hypertrophy.

## 2020-01-25 IMAGING — CR DG ABDOMEN 1V
1 series · 3 of 3 positions shown · non-contrast
Comparison: Body CT 11/14/2017, abdominal radiograph 11/14/2017

CLINICAL DATA: Left-sided ureteral stone.

EXAM:
ABDOMEN - 1 VIEW

[Series 1: dg abd 1 view · 0.14mm/px · 3 of 3 slices shown]
[im 1/3]
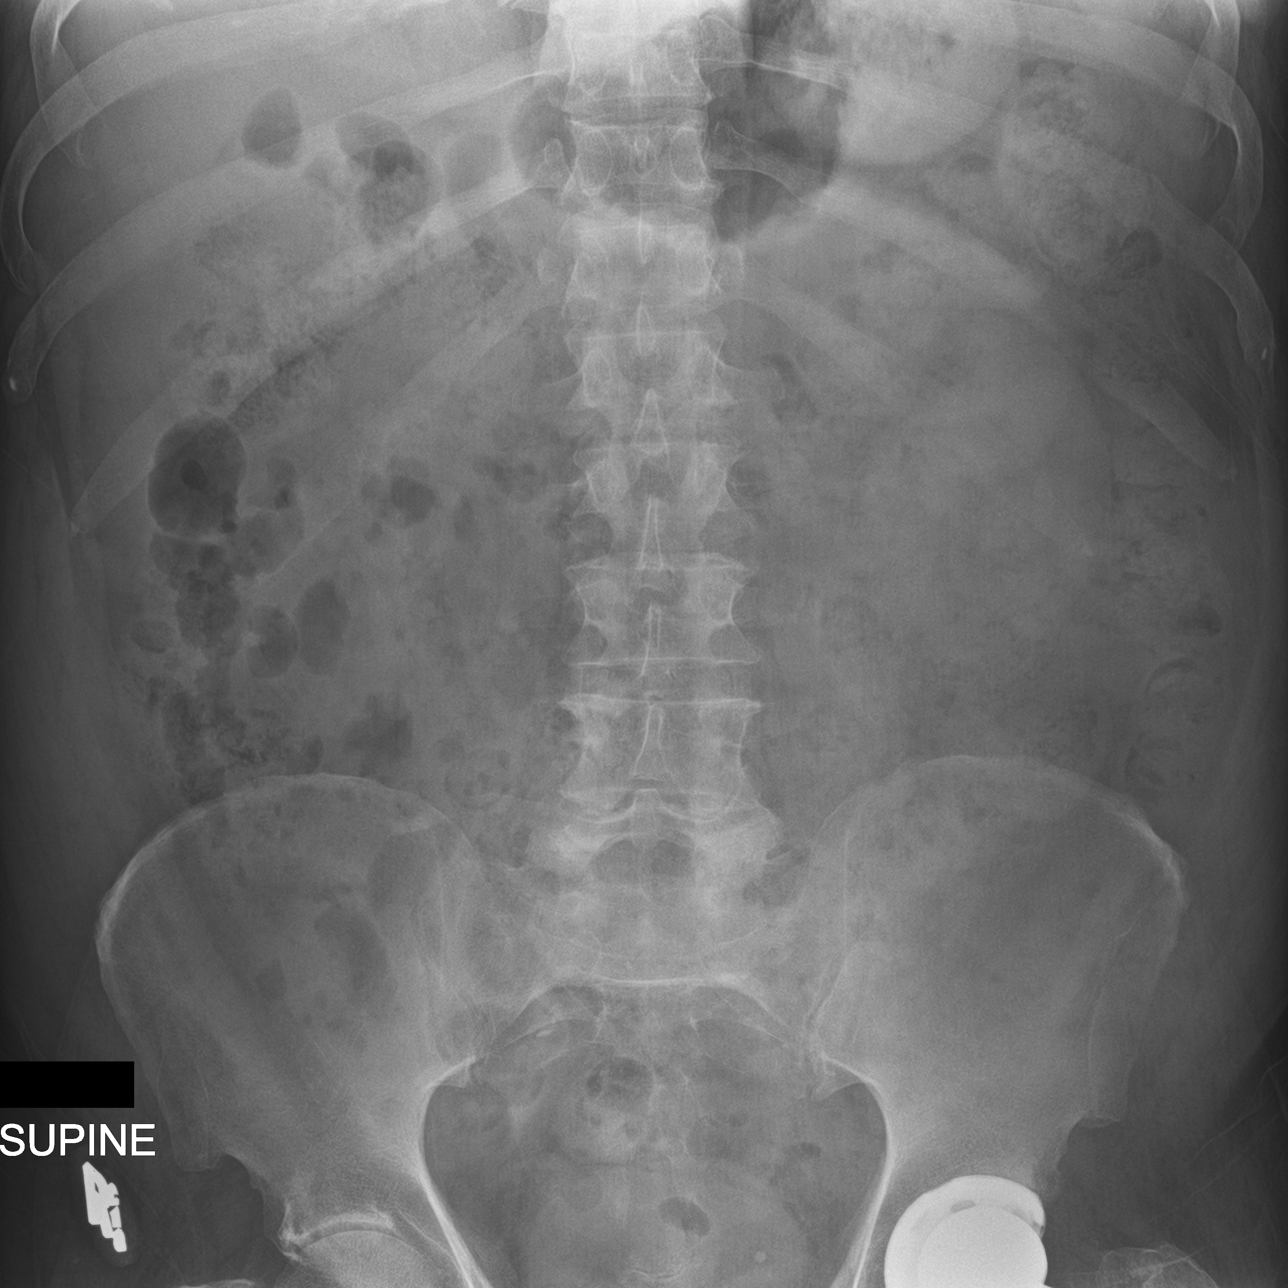
[im 2/3]
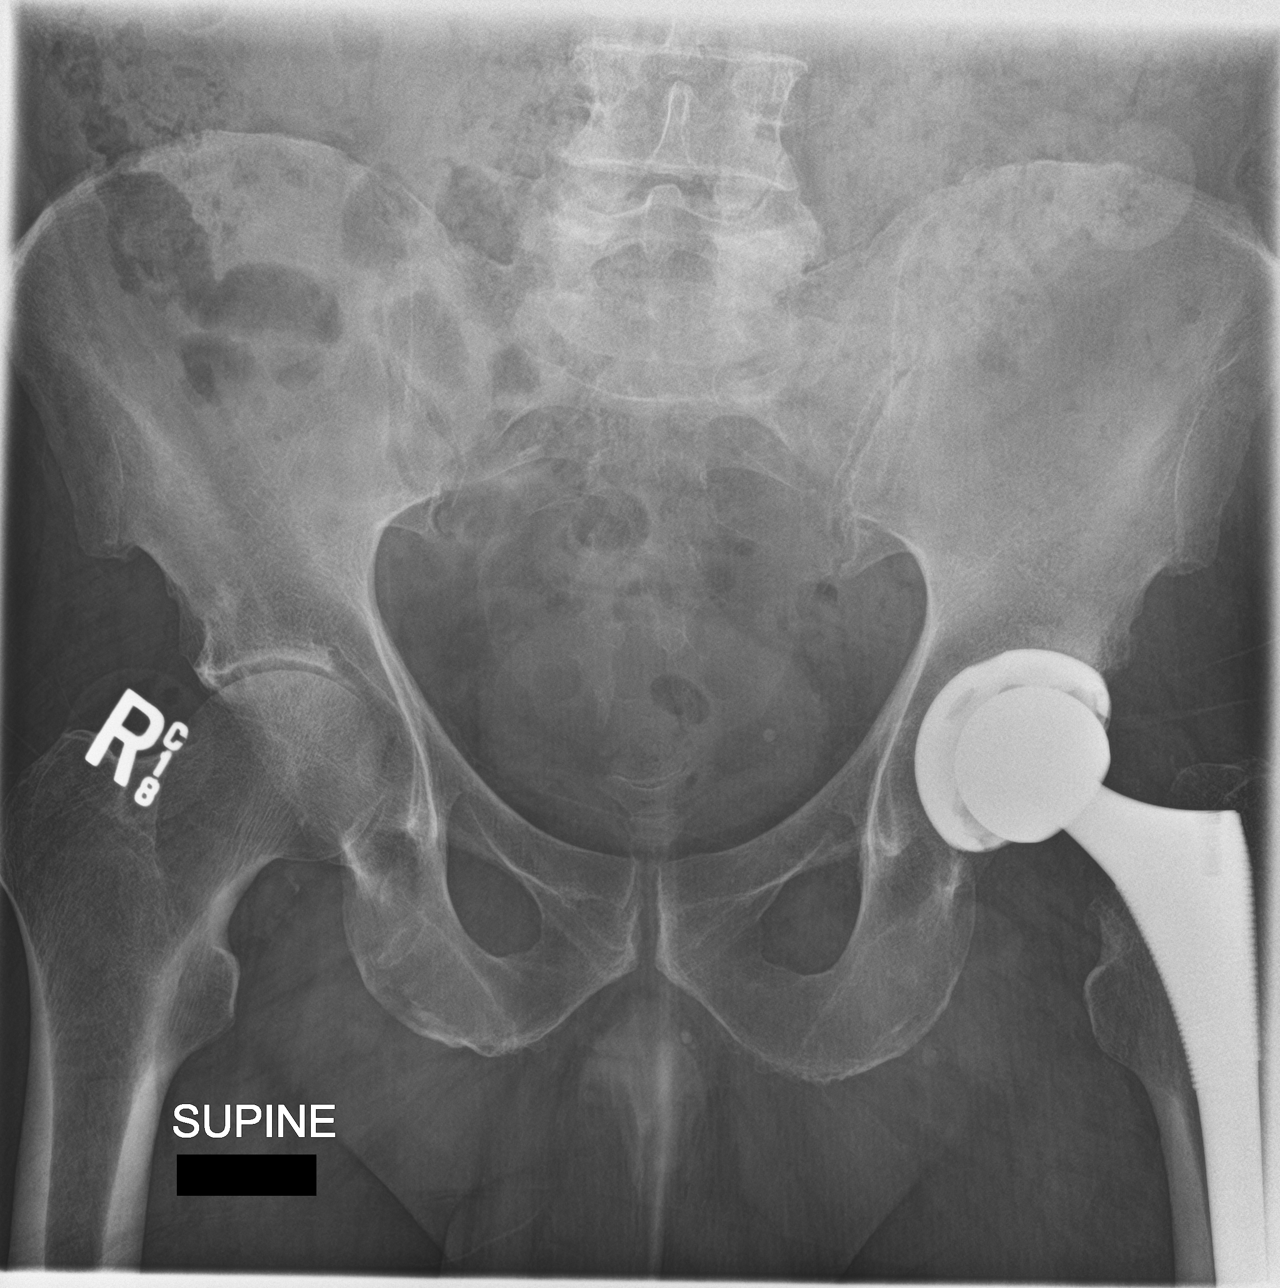
[im 3/3]
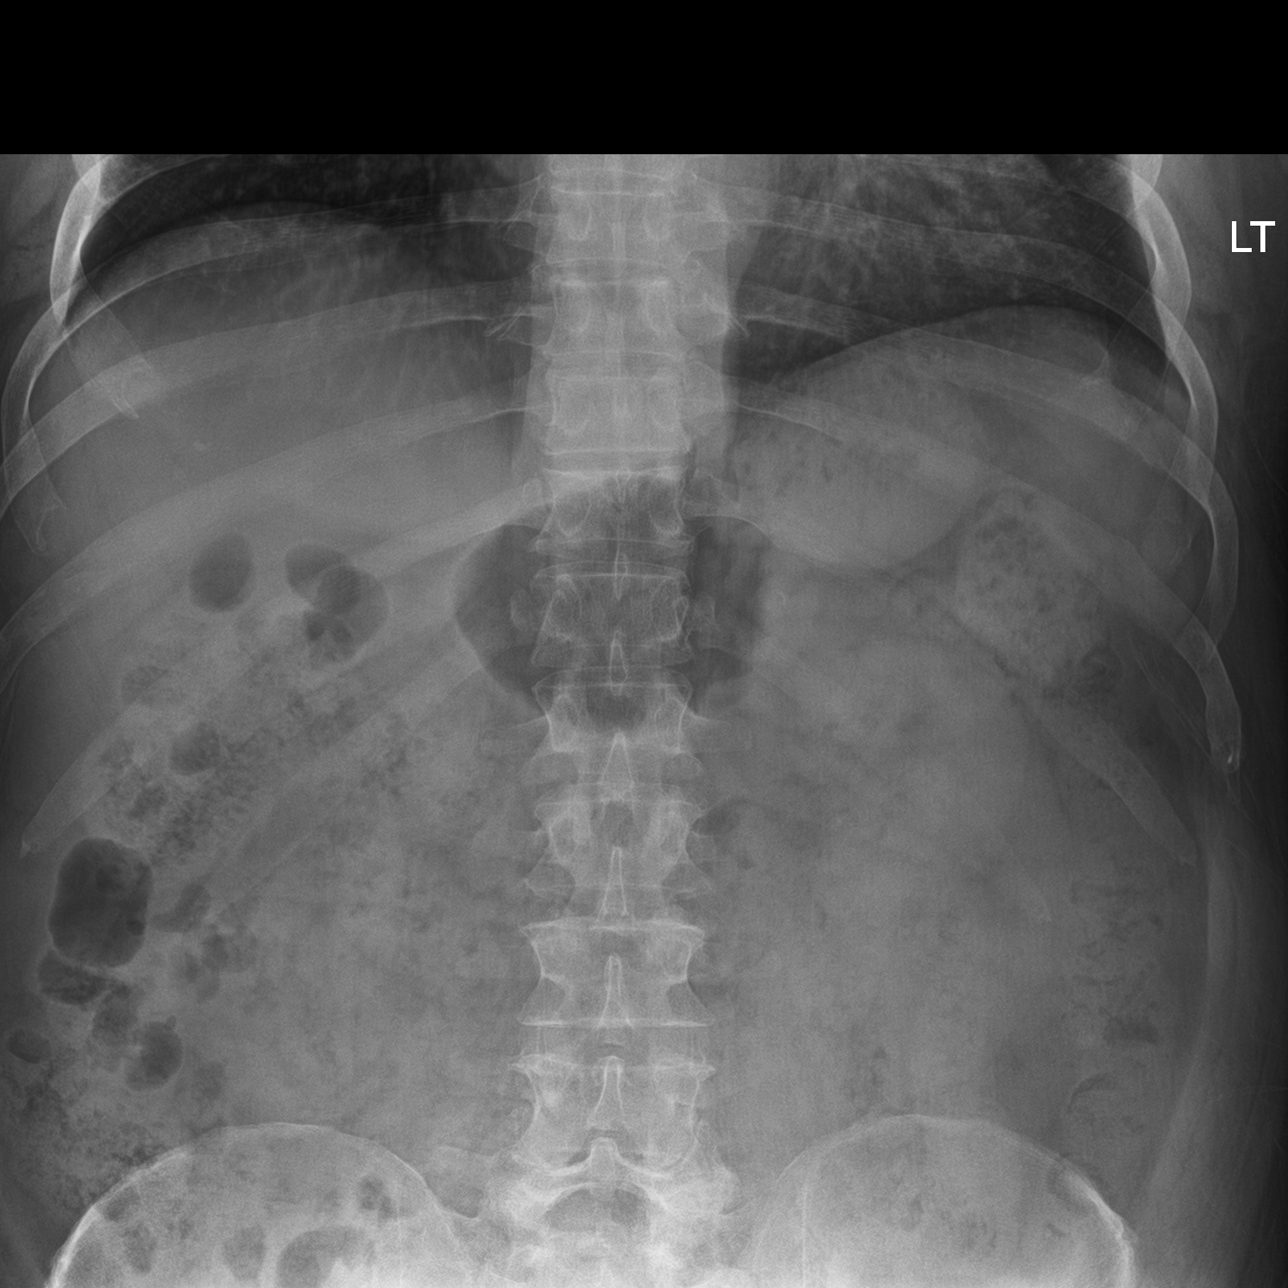

[3 of 3 positions shown; findings below may reference images not displayed]

FINDINGS: The bowel gas pattern is normal. No radio-opaque calculi or other
significant radiographic abnormality are seen. Large amount of
formed stool obscures the renal shadows. Phleboliths in the pelvis
noted.
IMPRESSION: No definite renal or ureteral calculi seen radiographically.

Large amount of formed colonic stool largely obscures the renal
shadows.

## 2020-02-25 ENCOUNTER — Ambulatory Visit: Payer: Self-pay | Admitting: Gastroenterology

## 2020-03-14 ENCOUNTER — Encounter: Payer: Self-pay | Admitting: Urology

## 2020-04-02 ENCOUNTER — Ambulatory Visit: Payer: Self-pay | Admitting: Gastroenterology

## 2020-04-02 ENCOUNTER — Other Ambulatory Visit: Payer: Self-pay

## 2020-04-07 ENCOUNTER — Other Ambulatory Visit: Payer: Self-pay | Admitting: Family Medicine

## 2020-04-07 DIAGNOSIS — I252 Old myocardial infarction: Secondary | ICD-10-CM

## 2020-04-07 DIAGNOSIS — I10 Essential (primary) hypertension: Secondary | ICD-10-CM

## 2020-04-07 NOTE — Telephone Encounter (Signed)
Requested Prescriptions  Pending Prescriptions Disp Refills  . carvedilol (COREG) 25 MG tablet [Pharmacy Med Name: CARVEDILOL 25 MG TABLET] 180 tablet 0    Sig: TAKE ONE TABLET TWICE A DAY WITH FOOD.     Cardiovascular:  Beta Blockers Passed - 04/07/2020 12:25 PM      Passed - Last BP in normal range    BP Readings from Last 1 Encounters:  10/10/19 130/60         Passed - Last Heart Rate in normal range    Pulse Readings from Last 1 Encounters:  10/10/19 81         Passed - Valid encounter within last 6 months    Recent Outpatient Visits          6 months ago Hyperglycemia   [facility] [place], Drue Stager, MD   9 months ago Dyslipidemia associated with type 2 diabetes mellitus [facility])   [facility] [place], Drue Stager, MD   12 months ago Dyslipidemia associated with type 2 diabetes mellitus [facility])   [facility] Steele Sizer, MD   1 year ago Hospital discharge follow-up   [facility] Steele Sizer, MD   1 year ago Atherosclerosis of aorta [facility])   [facility] Steele Sizer, MD      Future Appointments            In 4 days Steele Sizer, MD [facility]

## 2020-04-09 NOTE — Progress Notes (Unsigned)
Name: Tim Anderson   MRN: [id number]    DOB: 1965-12-12   Date:04/11/2020       Progress Note  Subjective  Chief Complaint  Follow up   HPI   Angina .He had MI on 02/2019 it was  NSTEMI  he had a cardiac cath but stent was not placed, he is under the care of Dr. Nehemiah Massed, he tried Atorvastatin, Crestor and Pravastatin most recently Mevacor but develops sever myalgia.We gave you  zetia ( discussed Good RX ) but not on his active list, we will trying sending to pharmacy again. He is compliant with Plavix, beta blocker . He had a chest tightness on left side about 2 weeks ago, lasted 10-15 minutes and resolved with eight 81 mg aspirins, explained he only needs 4-5 next time, but should use NTG also   Chronic bronchitis: he smoked for over 30 years, quit again 02/07/2020, he still has a productive cough in am's, he has trelegy at home but not very complaint, he has sob with activity also wheezing   GERD: he is on medication and symptoms are controlledhe has been taking medication daily, states when skipping symptoms returns. Controlled   OSA:  He  has a new mask and stars with CPAP every night but removes during the night,he is wearing for a couple hours every few weeks.   Migraine/headaches: he has dull ache/tension type headachedull frontal headacheat least once a week, he takes Toradol prn for that, last rx lasted over 6 months .No recent episodes of migraine. He states migraine is more intense, associated with photophobia, phonophobia, nausea and vomiting, sometimes dizziness.   RA:  off Enbrel because of cost but is on Plaquenilgiven by Dr. Jefm Bryant.  He states he has swelling on both hands - fingers and MCP joints at the end of the day, he denies increase in warmth or redness . Reviewed recent labs done at [facility] in July 2021   HTN:  BP has gone up, recent visit with cardiologist showed bp over 150/90 , so Lotrel dose was adjusted and today it is better. He has  gained weight since he quit smoking in Dec. Discussed low salt diet, continue physical activity . He has some dizziness when he stoops down and gets up quickly. No edema. He has intermittent chest pain   History of DM  diagnosed Nov 2016, but has been below 6.4 % for many years without medications. This level is up to 6.2 %, discussed importance of low carb diet.  Hyperlipidemia: hewas  on Lipitor since 02/2019 after MI, however stopped because of muscle aches, explained importance of statin therapy , we tried Pravastatin but also unable tolerate it , we will resent rx of Zetia, he would be a great candidate for prescription assistance program and PCSK9 injections   Atherosclerosis of aorta: on plavix but off statin therapy, we will try sending zetia again   Rectal bleeding: going on for months, happens a few times a month, no pain, up to date with colonoscopy, he has a history of hemorrhoids and not ready to go back to GI  Patient Active Problem List   Diagnosis Date Noted  . History of diabetes mellitus, type II 10/10/2019  . Coronary artery disease involving native coronary artery of native heart 04/04/2019  . History of non-ST elevation myocardial infarction (NSTEMI) 03/12/2019  . 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 Atherosclerosis of aorta ([place]) 06/09/2018  . Mediastinal lymphadenopathy 06/09/2018  . Lung  nodules 06/09/2018  . Arthralgia of left temporomandibular joint 12/08/2017  . Mucopurulent chronic bronchitis ([place]) 12/08/2017  . Mild left ventricular systolic dysfunction 74/01/8785  . Grade II diastolic dysfunction 76/72/0947  . Mild mitral regurgitation 07/05/2017  . Mild tricuspid regurgitation 07/05/2017  . History of left hip replacement 12/27/2016  . Angina pectoris ([place]) 10/27/2016  . Carpal tunnel syndrome on both sides 04/07/2016  . Primary osteoarthritis involving multiple joints 07/29/2015  . Arthritis of knee,  degenerative 02/03/2015  . Benign prostatic hyperplasia 09/28/2014  . Chronic obstructive pulmonary disease ([place]) 09/28/2014  . Drug abuse ([place]) 09/28/2014  . Deflected nasal septum 09/28/2014  . Dyslipidemia 09/28/2014  . Dysmetabolic syndrome 09/62/8366  . GERD (gastroesophageal reflux disease) 09/28/2014  . H/O renal calculi 09/28/2014  . HTN (hypertension) 09/28/2014  . Male hypogonadism 09/28/2014  . Failure of erection 09/28/2014  . Obstructive sleep apnea syndrome 09/28/2014  . Depression with anxiety 09/28/2014  . Obesity (BMI 30-39.9) 09/28/2014  . Migraine without aura and responsive to treatment 09/28/2014  . Perennial allergic rhinitis with seasonal variation 09/28/2014  . Elevated platelet count 09/28/2014  . Tobacco abuse 09/28/2014  . Cerebrovascular accident (CVA) ([place]) 07/27/2013  . History of TIA (transient ischemic attack) 09/20/2011  . Rheumatoid arthritis involving multiple joints ([place]) 07/31/2009    Past Surgical History:  Procedure Laterality Date  . CORONARY BALLOON ANGIOPLASTY N/A 03/14/2019   Procedure: CORONARY BALLOON ANGIOPLASTY;  Surgeon: Yolonda Kida, MD;  Location: [facility];  Service: Cardiovascular;  Laterality: N/A;  . FACIAL RECONSTRUCTION SURGERY    . JOINT REPLACEMENT Left    hip  . JOINT REPLACEMENT Right    knee  . KNEE SURGERY Right   . LEFT HEART CATH AND CORONARY ANGIOGRAPHY N/A 03/14/2019   Procedure: LEFT HEART CATH AND CORONARY ANGIOGRAPHY;  Surgeon: Corey Skains, MD;  Location: [facility];  Service: Cardiovascular;  Laterality: N/A;    Family History  Problem Relation Age of Onset  . Diabetes Mother   . Diabetes Father   . Heart attack Father   . Cancer Brother        Skin and Prostate-Oldest Brother  . Diabetes Brother     Social History   Tobacco Use  . Smoking status: Former Smoker    Packs/day: 1.00    Years: 30.00    Pack years: 30.00    Types: Cigarettes    Start date: 09/30/1976     Quit date: 02/07/2020    Years since quitting: 0.1  . Smokeless tobacco: Never Used  . Tobacco comment: he quit   Substance Use Topics  . Alcohol use: Yes    Alcohol/week: 4.0 standard drinks    Types: 4 Cans of beer per week    Comment: daily     Current Outpatient Medications:  .  amLODipine-benazepril (LOTREL) 10-40 MG capsule, Take 1 capsule by mouth daily., Disp: , Rfl:  .  aspirin EC 81 MG tablet, Take 81 mg by mouth. Reported on 07/29/2015, Disp: , Rfl:  .  clopidogrel (PLAVIX) 75 MG tablet, Take 1 tablet (75 mg total) by mouth daily., Disp: 90 tablet, Rfl: 1 .  Fluticasone-Umeclidin-Vilant (TRELEGY ELLIPTA IN), Inhale 1 puff into the lungs daily., Disp: , Rfl:  .  hydroxychloroquine (PLAQUENIL) 200 MG tablet, Take 400 tablets by mouth at bedtime. , Disp: , Rfl:  .  mirabegron ER (MYRBETRIQ) 25 MG TB24 tablet, Take 1 tablet (25 mg total) by mouth daily., Disp: 28 tablet, Rfl:  0 .  nitroGLYCERIN (NITROSTAT) 0.4 MG SL tablet, Place 1 tablet (0.4 mg total) under the tongue every 5 (five) minutes x 3 doses as needed for chest pain., Disp: 100 tablet, Rfl: 0 .  promethazine (PHENERGAN) 12.5 MG tablet, Take 1 tablet (12.5 mg total) by mouth every 8 (eight) hours as needed for nausea or vomiting., Disp: 20 tablet, Rfl: 0 .  rizatriptan (MAXALT) 10 MG tablet, Take 1 tablet (10 mg total) by mouth as needed for migraine., Disp: 10 tablet, Rfl: 0 .  sildenafil (REVATIO) 20 MG tablet, TAKE 3-5 TABLET BY MOUTH TWO HOURS BEFORE INTERCOURSE ON AN EMPTY STOMACH. DO NOT TAKE WITH NITRATES, Disp: 30 tablet, Rfl: 0 .  tamsulosin (FLOMAX) 0.4 MG CAPS capsule, Take 1 capsule (0.4 mg total) by mouth daily., Disp: 90 capsule, Rfl: 3 .  carvedilol (COREG) 25 MG tablet, Take 1 tablet (25 mg total) by mouth 2 (two) times daily with a meal., Disp: 180 tablet, Rfl: 1 .  ketorolac (TORADOL) 10 MG tablet, Take 1 tablet (10 mg total) by mouth every 6 (six) hours as needed., Disp: 30 tablet, Rfl: 1 .   pantoprazole (PROTONIX) 20 MG tablet, Take 1 tablet (20 mg total) by mouth daily., Disp: 90 tablet, Rfl: 1  Allergies  Allergen Reactions  . Ciprofloxacin Other (See Comments)    Body aches  . Crestor [Rosuvastatin Calcium] Other (See Comments)    Muscles aches  . Droperidol Other (See Comments)    Heart stopped  . Lipofen  [Fenofibrate]     muscle aches muscle aches  . Niacin Er     flushed  . Pravastatin     Headache     I personally reviewed active problem list, medication list, allergies, family history, social history, health maintenance with the patient/caregiver today.   ROS  Constitutional: Negative for fever , positive weight change.  Respiratory: Positive for cough and shortness of breath.   Cardiovascular: Negative for chest pain or palpitations.  Gastrointestinal: Negative for abdominal pain, no bowel changes.  Musculoskeletal: Negative for gait problem or joint swelling.  Skin: Negative for rash.  Neurological: Negative for dizziness , positive for intermittent headache.  No other specific complaints in a complete review of systems (except as listed in HPI above).  Objective  Vitals:   04/11/20 0747  BP: 140/88  Pulse: 87  Resp: 16  Temp: 98.2 F (36.8 C)  TempSrc: Oral  SpO2: 97%  Weight: 229 lb (103.9 kg)  Height: 5\' 7"  (1.702 m)    Body mass index is 35.87 kg/m.  Physical Exam  Constitutional: Patient appears well-developed and well-nourished. Obese  No distress.  HEENT: head atraumatic, normocephalic, pupils equal and reactive to light, neck supple Cardiovascular: Normal rate, regular rhythm and normal heart sounds.  No murmur heard. No BLE edema. Pulmonary/Chest: Effort normal and breath sounds normal. No respiratory distress. Abdominal: Soft.  There is no tenderness. Muscular skeletal: synovitis right hand, unable to make a tight fist, no knee effusion  Psychiatric: Patient has a normal mood and affect. behavior is normal. Judgment and  thought content normal.  Recent Results (from the past 2160 hour(s))  POCT HgB A1C     Status: Abnormal   Collection Time: 04/11/20  7:50 AM  Result Value Ref Range   Hemoglobin A1C 6.2 (A) 4.0 - 5.6 %   HbA1c POC (<> result, manual entry)     HbA1c, POC (prediabetic range)     HbA1c, POC (controlled diabetic range)  PHQ2/9: Depression screen [facility] 2/9 04/11/2020 10/10/2019 07/10/2019 04/12/2019 03/22/2019  Decreased Interest 0 0 0 0 2  Down, Depressed, Hopeless 0 0 0 0 0  PHQ - 2 Score 0 0 0 0 2  Altered sleeping - 1 1 0 3  Tired, decreased energy - 2 1 0 3  Change in appetite - 2 0 0 2  Feeling bad or failure about yourself  - 1 0 0 1  Trouble concentrating - 0 0 0 2  Moving slowly or fidgety/restless - 0 0 0 0  Suicidal thoughts - 0 0 0 0  PHQ-9 Score - 6 2 0 13  Difficult doing work/chores - Somewhat difficult Not difficult at all - Somewhat difficult  Some recent data might be hidden    phq 9 is negative   Fall Risk: Fall Risk  04/11/2020 10/10/2019 07/10/2019 04/12/2019 03/22/2019  Falls in the past year? 1 1 1  0 0  Number falls in past yr: 1 1 1  0 0  Comment 3 - - - -  Injury with Fall? 1 0 1 0 0  Risk for fall due to : - - History of fall(s) - -  Follow up - - Falls evaluation completed - -     Functional Status Survey: Is the patient deaf or have difficulty hearing?: No Does the patient have difficulty seeing, even when wearing glasses/contacts?: No Does the patient have difficulty concentrating, remembering, or making decisions?: Yes Does the patient have difficulty walking or climbing stairs?: Yes Does the patient have difficulty dressing or bathing?: Yes Does the patient have difficulty doing errands alone such as visiting a doctor's office or shopping?: No   Assessment & Plan  1. Angina pectoris (HCC)  Intermittent, has NTG  2. Gastroesophageal reflux disease without esophagitis  - pantoprazole (PROTONIX) 20 MG tablet; Take 1 tablet (20 mg total) by  mouth daily.  Dispense: 90 tablet; Refill: 1  3. Statin myopathy   4. Mucopurulent chronic bronchitis ([place])  He forgets to use Trelegy   5. Atherosclerosis of aorta (HCC)  - Lipid panel  6. Rheumatoid arthritis involving multiple joints (HCC)  Needs to follow up with Dr. Jefm Bryant   7. Hyperglycemia  - POCT HgB A1C  8. History of MI (myocardial infarction)  - carvedilol (COREG) 25 MG tablet; Take 1 tablet (25 mg total) by mouth 2 (two) times daily with a meal.  Dispense: 180 tablet; Refill: 1  9. Primary osteoarthritis involving multiple joints   10. Obstructive apnea  Needs to resume CPAP   11. Essential hypertension  - carvedilol (COREG) 25 MG tablet; Take 1 tablet (25 mg total) by mouth 2 (two) times daily with a meal.  Dispense: 180 tablet; Refill: 1 - COMPLETE METABOLIC PANEL WITH GFR  12. Migraine without aura and responsive to treatment  - ketorolac (TORADOL) 10 MG tablet; Take 1 tablet (10 mg total) by mouth every 6 (six) hours as needed.  Dispense: 30 tablet; Refill: 1  13. History of diabetes mellitus, type II   14. Morbid obesity ([place])  BMI above 35 with multiple co-morbidities ( history of MI, OA, GERD )  explained need to lose weight

## 2020-04-11 ENCOUNTER — Other Ambulatory Visit: Payer: Self-pay

## 2020-04-11 ENCOUNTER — Encounter: Payer: Self-pay | Admitting: Family Medicine

## 2020-04-11 ENCOUNTER — Ambulatory Visit (INDEPENDENT_AMBULATORY_CARE_PROVIDER_SITE_OTHER): Payer: Self-pay | Admitting: Family Medicine

## 2020-04-11 VITALS — BP 128/78 | HR 87 | Temp 98.2°F | Resp 16 | Ht 67.0 in | Wt 229.0 lb

## 2020-04-11 DIAGNOSIS — I10 Essential (primary) hypertension: Secondary | ICD-10-CM

## 2020-04-11 DIAGNOSIS — M8949 Other hypertrophic osteoarthropathy, multiple sites: Secondary | ICD-10-CM

## 2020-04-11 DIAGNOSIS — G720 Drug-induced myopathy: Secondary | ICD-10-CM

## 2020-04-11 DIAGNOSIS — K219 Gastro-esophageal reflux disease without esophagitis: Secondary | ICD-10-CM

## 2020-04-11 DIAGNOSIS — M069 Rheumatoid arthritis, unspecified: Secondary | ICD-10-CM

## 2020-04-11 DIAGNOSIS — M159 Polyosteoarthritis, unspecified: Secondary | ICD-10-CM

## 2020-04-11 DIAGNOSIS — T466X5A Adverse effect of antihyperlipidemic and antiarteriosclerotic drugs, initial encounter: Secondary | ICD-10-CM

## 2020-04-11 DIAGNOSIS — Z8639 Personal history of other endocrine, nutritional and metabolic disease: Secondary | ICD-10-CM

## 2020-04-11 DIAGNOSIS — J411 Mucopurulent chronic bronchitis: Secondary | ICD-10-CM

## 2020-04-11 DIAGNOSIS — I252 Old myocardial infarction: Secondary | ICD-10-CM

## 2020-04-11 DIAGNOSIS — I209 Angina pectoris, unspecified: Secondary | ICD-10-CM

## 2020-04-11 DIAGNOSIS — R739 Hyperglycemia, unspecified: Secondary | ICD-10-CM

## 2020-04-11 DIAGNOSIS — G4733 Obstructive sleep apnea (adult) (pediatric): Secondary | ICD-10-CM

## 2020-04-11 DIAGNOSIS — I700 Atherosclerosis of aorta: Secondary | ICD-10-CM

## 2020-04-11 DIAGNOSIS — G43009 Migraine without aura, not intractable, without status migrainosus: Secondary | ICD-10-CM

## 2020-04-11 LAB — POCT GLYCOSYLATED HEMOGLOBIN (HGB A1C): Hemoglobin A1C: 6.2 % — AB (ref 4.0–5.6)

## 2020-04-11 MED ORDER — CARVEDILOL 25 MG PO TABS: 25.0000 mg | ORAL_TABLET | Freq: Two times a day (BID) | ORAL | 1 refills | Status: DC

## 2020-04-11 MED ORDER — PANTOPRAZOLE SODIUM 20 MG PO TBEC: 20.0000 mg | DELAYED_RELEASE_TABLET | Freq: Every day | ORAL | 1 refills | Status: DC

## 2020-04-11 MED ORDER — KETOROLAC TROMETHAMINE 10 MG PO TABS: 10.0000 mg | ORAL_TABLET | Freq: Four times a day (QID) | ORAL | 1 refills | Status: DC | PRN

## 2020-04-11 MED ORDER — EZETIMIBE 10 MG PO TABS: 10.0000 mg | ORAL_TABLET | Freq: Every day | ORAL | 3 refills | Status: DC

## 2020-04-12 LAB — COMPLETE METABOLIC PANEL WITH GFR
AG Ratio: 1.3 (calc) (ref 1.0–2.5)
ALT: 17 U/L (ref 9–46)
AST: 13 U/L (ref 10–35)
Albumin: 4 g/dL (ref 3.6–5.1)
Alkaline phosphatase (APISO): 69 U/L (ref 35–144)
BUN/Creatinine Ratio: 15 (calc) (ref 6–22)
BUN: 9 mg/dL (ref 7–25)
CO2: 31 mmol/L (ref 20–32)
Calcium: 9.6 mg/dL (ref 8.6–10.3)
Chloride: 101 mmol/L (ref 98–110)
Creat: 0.61 mg/dL — ABNORMAL LOW (ref 0.70–1.33)
GFR, Est African American: 129 mL/min/{1.73_m2} (ref >=60)
GFR, Est Non African American: 112 mL/min/{1.73_m2} (ref >=60)
Globulin: 3.2 g/dL (calc) (ref 1.9–3.7)
Glucose, Bld: 120 mg/dL — ABNORMAL HIGH (ref 65–99)
Potassium: 4.8 mmol/L (ref 3.5–5.3)
Sodium: 140 mmol/L (ref 135–146)
Total Bilirubin: 0.3 mg/dL (ref 0.2–1.2)
Total Protein: 7.2 g/dL (ref 6.1–8.1)

## 2020-04-12 LAB — LIPID PANEL
Cholesterol: 157 mg/dL (ref <200)
HDL: 31 mg/dL — ABNORMAL LOW (ref >=40)
LDL Cholesterol (Calc): 99 mg/dL (calc)
Non-HDL Cholesterol (Calc): 126 mg/dL (calc) (ref <130)
Total CHOL/HDL Ratio: 5.1 (calc) — ABNORMAL HIGH (ref <5.0)
Triglycerides: 177 mg/dL — ABNORMAL HIGH (ref <150)

## 2020-04-15 ENCOUNTER — Other Ambulatory Visit: Payer: Self-pay

## 2020-04-15 ENCOUNTER — Ambulatory Visit (INDEPENDENT_AMBULATORY_CARE_PROVIDER_SITE_OTHER): Payer: Self-pay | Admitting: Gastroenterology

## 2020-04-15 ENCOUNTER — Encounter: Payer: Self-pay | Admitting: Gastroenterology

## 2020-04-15 VITALS — BP 135/88 | HR 80 | Temp 98.1°F | Ht 67.0 in | Wt 227.1 lb

## 2020-04-15 DIAGNOSIS — K625 Hemorrhage of anus and rectum: Secondary | ICD-10-CM

## 2020-04-15 MED ORDER — HYDROCORTISONE (PERIANAL) 2.5 % EX CREA: 1.0000 "application " | TOPICAL_CREAM | Freq: Two times a day (BID) | CUTANEOUS | 0 refills | Status: AC

## 2020-04-15 NOTE — Patient Instructions (Signed)
High-Fiber Eating Plan Fiber, also called dietary fiber, is a type of carbohydrate. It is found foods such as fruits, vegetables, whole grains, and beans. A high-fiber diet can have many health benefits. Your health care provider may recommend a high-fiber diet to help:  Prevent constipation. Fiber can make your bowel movements more regular.  Lower your cholesterol.  Relieve the following conditions: ? Inflammation of veins in the anus (hemorrhoids). ? Inflammation of specific areas of the digestive tract (uncomplicated diverticulosis). ? A problem of the large intestine, also called the colon, that sometimes causes pain and diarrhea (irritable bowel syndrome, or IBS).  Prevent overeating as part of a weight-loss plan.  Prevent heart disease, type 2 diabetes, and certain cancers. What are tips for following this plan? Reading food labels  Check the nutrition facts label on food products for the amount of dietary fiber. Choose foods that have 5 grams of fiber or more per serving.  The goals for recommended daily fiber intake include: ? Men (age 50 or younger): 34-38 g. ? Men (over age 50): 28-34 g. ? Women (age 50 or younger): 25-28 g. ? Women (over age 50): 22-25 g. Your daily fiber goal is _____________ g.   Shopping  Choose whole fruits and vegetables instead of processed forms, such as apple juice or applesauce.  Choose a wide variety of high-fiber foods such as avocados, lentils, oats, and kidney beans.  Read the nutrition facts label of the foods you choose. Be aware of foods with added fiber. These foods often have high sugar and sodium amounts per serving. Cooking  Use whole-grain flour for baking and cooking.  Cook with brown rice instead of white rice. Meal planning  Start the day with a breakfast that is high in fiber, such as a cereal that contains 5 g of fiber or more per serving.  Eat breads and cereals that are made with whole-grain flour instead of refined  flour or white flour.  Eat brown rice, bulgur wheat, or millet instead of white rice.  Use beans in place of meat in soups, salads, and pasta dishes.  Be sure that half of the grains you eat each day are whole grains. General information  You can get the recommended daily intake of dietary fiber by: ? Eating a variety of fruits, vegetables, grains, nuts, and beans. ? Taking a fiber supplement if you are not able to take in enough fiber in your diet. It is better to get fiber through food than from a supplement.  Gradually increase how much fiber you consume. If you increase your intake of dietary fiber too quickly, you may have bloating, cramping, or gas.  Drink plenty of water to help you digest fiber.  Choose high-fiber snacks, such as berries, raw vegetables, nuts, and popcorn. What foods should I eat? Fruits Berries. Pears. Apples. Oranges. Avocado. Prunes and raisins. Dried figs. Vegetables Sweet potatoes. Spinach. Kale. Artichokes. Cabbage. Broccoli. Cauliflower. Green peas. Carrots. Squash. Grains Whole-grain breads. Multigrain cereal. Oats and oatmeal. Brown rice. Barley. Bulgur wheat. Millet. Quinoa. Bran muffins. Popcorn. Rye wafer crackers. Meats and other proteins Navy beans, kidney beans, and pinto beans. Soybeans. Split peas. Lentils. Nuts and seeds. Dairy Fiber-fortified yogurt. Beverages Fiber-fortified soy milk. Fiber-fortified orange juice. Other foods Fiber bars. The items listed above may not be a complete list of recommended foods and beverages. Contact a dietitian for more information. What foods should I avoid? Fruits Fruit juice. Cooked, strained fruit. Vegetables Fried potatoes. Canned vegetables. Well-cooked vegetables. Grains   White bread. Pasta made with refined flour. White rice. Meats and other proteins Fatty cuts of meat. Fried chicken or fried fish. Dairy Milk. Yogurt. Cream cheese. Sour cream. Fats and oils Butters. Beverages Soft  drinks. Other foods Cakes and pastries. The items listed above may not be a complete list of foods and beverages to avoid. Talk with your dietitian about what choices are best for you. Summary  Fiber is a type of carbohydrate. It is found in foods such as fruits, vegetables, whole grains, and beans.  A high-fiber diet has many benefits. It can help to prevent constipation, lower blood cholesterol, aid weight loss, and reduce your risk of heart disease, diabetes, and certain cancers.  Increase your intake of fiber gradually. Increasing fiber too quickly may cause cramping, bloating, and gas. Drink plenty of water while you increase the amount of fiber you consume.  The best sources of fiber include whole fruits and vegetables, whole grains, nuts, seeds, and beans. This information is not intended to replace advice given to you by your health care provider. Make sure you discuss any questions you have with your health care provider. Document Revised: 06/07/2019 Document Reviewed: 06/07/2019 Elsevier Patient Education  2021 [employer]  

## 2020-04-15 NOTE — Progress Notes (Signed)
Cephas Darby, MD [address]  [place]  [place], [place] [address]  Main: [phone]  Fax: [phone]    Gastroenterology Consultation  Referring Provider:     Laneta Simmers Primary Care Physician:  Steele Sizer, MD Primary Gastroenterologist:  Dr. Cephas Darby Reason for Consultation:   Rectal bleeding        HPI:   Tim Anderson is a 55 y.o. male referred by Dr. Steele Sizer, MD  for consultation & management of rectal bleeding.  Patient reports approximately 2 years history of intermittent bright red blood per rectum, predominantly on wiping and sometimes on surface of the stool.  He has history of coronary artery disease, on DAPT, history of chronic tobacco use, quit smoking 1 pack/day in December 2021, history of alcohol use, he admits to drinking liquor 3-4 drinks, 2-3 times a week.  He does report intermittent hard stools, on Bristol stool scale 2, associated with straining.  He just started healthy diet, trying to incorporate more vegetables, low-carb because he realized that he gained significant weight.  His labs revealed normal hemoglobin in 7/21.  He has history of rheumatoid arthritis on Plaquenil.  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  He does not have insurance, trying to get some help  NSAIDs: None  Antiplts/Anticoagulants/Anti thrombotics: Aspirin and Plavix for history of coronary disease  GI Procedures: EGD and colonoscopy colonoscopy 07/21/2012 Diagnosis:  Part A: RECTO SIGMOID COLON POLYP COLD BIOPSY:  - HYPERPLASTIC POLYP (1 FRAGMENT).  - BENIGN COLONIC MUCOSA WITH PROMINENT LYMPHOID AGGREGATE (1  FRAGMENT).  - NEGATIVE FOR DYSPLASIA AND MALIGNANCY.  .  Part B: TRANSVERSE COLON POLYP COLD BIOPSY:  - POLYPOID COLONIC MUCOSA, SIGNIFICANT PATHOLOGIC CHANGES (2  FRAGMENTS).  - NEGATIVE FOR DYSPLASIA AND MALIGNANCY.  .  Part C: SIGMOID COLON POLYP COLD BIOPSY:  - COLONIC MUCOSA WITH FOCAL HYPERPLASTIC CHANGE (1 FRAGMENT).  -  BENIGN COLONIC MUCOSA WITH PROMINENT LYMPHOID AGGREGATE (1  FRAGMENT).  - NEGATIVE FOR DYSPLASIA AND MALIGNANCY.  .  Part D: ANTRAL ULCER COLD BIOPSY:  - ACUTE EROSIVE GASTRITIS WITH ULCERATION (1 FRAGMENT).  - ANTRAL MUCOSA WITH MILD CHRONIC GASTRITIS AND SUPERFICIAL  VASCULAR CONGESTION WITH REACTIVE FOVEOLAR HYPERPLASIA, 1  FRAGMENT (SEE COMMENT).  - NEGATIVE FOR DYSPLASIA AND MALIGNANCY.   Past Medical History:  Diagnosis Date  . Allergy   . Arthritis   . BPH (benign prostatic hyperplasia)   . COPD (chronic obstructive pulmonary disease) ([place])   . Drug abuse ([place])   . Dysmetabolic syndrome X   . Erosive gastritis   . History of hip replacement 12/2016  . History of knee replacement 01/2015  . History of left hip replacement 12/27/2016  . Hyperlipidemia   . Hypertension   . Hypogonadism male   . Obesity   . Obsessive compulsive disorder   . OSA (obstructive sleep apnea)   . Radiculitis   . Stomatitis monilial   . Stroke ([place])   . Thrombocytosis     Past Surgical History:  Procedure Laterality Date  . CORONARY BALLOON ANGIOPLASTY N/A 03/14/2019   Procedure: CORONARY BALLOON ANGIOPLASTY;  Surgeon: Yolonda Kida, MD;  Location: [facility];  Service: Cardiovascular;  Laterality: N/A;  . FACIAL RECONSTRUCTION SURGERY    . JOINT REPLACEMENT Left    hip  . JOINT REPLACEMENT Right    knee  . KNEE SURGERY Right   . LEFT HEART CATH AND CORONARY ANGIOGRAPHY N/A 03/14/2019   Procedure: LEFT HEART CATH  AND CORONARY ANGIOGRAPHY;  Surgeon: Corey Skains, MD;  Location: [facility];  Service: Cardiovascular;  Laterality: N/A;    Current Outpatient Medications:  .  amLODipine-benazepril (LOTREL) 10-40 MG capsule, Take 1 capsule by mouth daily., Disp: , Rfl:  .  aspirin EC 81 MG tablet, Take 81 mg by mouth. Reported on 07/29/2015, Disp: , Rfl:  .  carvedilol (COREG) 25 MG tablet, Take 1 tablet (25 mg total) by mouth 2 (two) times daily with a meal., Disp:  180 tablet, Rfl: 1 .  clopidogrel (PLAVIX) 75 MG tablet, Take 1 tablet (75 mg total) by mouth daily., Disp: 90 tablet, Rfl: 1 .  Fluticasone-Umeclidin-Vilant (TRELEGY ELLIPTA IN), Inhale 1 puff into the lungs daily., Disp: , Rfl:  .  hydrocortisone (ANUSOL-HC) 2.5 % rectal cream, Place 1 application rectally 2 (two) times daily., Disp: 30 g, Rfl: 0 .  hydroxychloroquine (PLAQUENIL) 200 MG tablet, Take 400 tablets by mouth at bedtime. , Disp: , Rfl:  .  ketorolac (TORADOL) 10 MG tablet, Take 1 tablet (10 mg total) by mouth every 6 (six) hours as needed., Disp: 30 tablet, Rfl: 1 .  mirabegron ER (MYRBETRIQ) 25 MG TB24 tablet, Take 1 tablet (25 mg total) by mouth daily., Disp: 28 tablet, Rfl: 0 .  nitroGLYCERIN (NITROSTAT) 0.4 MG SL tablet, Place 1 tablet (0.4 mg total) under the tongue every 5 (five) minutes x 3 doses as needed for chest pain., Disp: 100 tablet, Rfl: 0 .  pantoprazole (PROTONIX) 20 MG tablet, Take 1 tablet (20 mg total) by mouth daily., Disp: 90 tablet, Rfl: 1 .  promethazine (PHENERGAN) 12.5 MG tablet, Take 1 tablet (12.5 mg total) by mouth every 8 (eight) hours as needed for nausea or vomiting., Disp: 20 tablet, Rfl: 0 .  rizatriptan (MAXALT) 10 MG tablet, Take 1 tablet (10 mg total) by mouth as needed for migraine., Disp: 10 tablet, Rfl: 0 .  sildenafil (REVATIO) 20 MG tablet, TAKE 3-5 TABLET BY MOUTH TWO HOURS BEFORE INTERCOURSE ON AN EMPTY STOMACH. DO NOT TAKE WITH NITRATES, Disp: 30 tablet, Rfl: 0 .  tamsulosin (FLOMAX) 0.4 MG CAPS capsule, Take 1 capsule (0.4 mg total) by mouth daily., Disp: 90 capsule, Rfl: 3 .  ezetimibe (ZETIA) 10 MG tablet, Take 1 tablet (10 mg total) by mouth daily. (Patient not taking: Reported on 04/15/2020), Disp: 90 tablet, Rfl: 3   Family History  Problem Relation Age of Onset  . Diabetes Mother   . Diabetes Father   . Heart attack Father   . Cancer Brother        Skin and Prostate-Oldest Brother  . Diabetes Brother      Social History    Tobacco Use  . Smoking status: Former Smoker    Packs/day: 1.00    Years: 30.00    Pack years: 30.00    Types: Cigarettes    Start date: 09/30/1976    Quit date: 02/07/2020    Years since quitting: 0.1  . Smokeless tobacco: Never Used  . Tobacco comment: he quit   Vaping Use  . Vaping Use: Never used  Substance Use Topics  . Alcohol use: Yes    Alcohol/week: 4.0 standard drinks    Types: 4 Cans of beer per week    Comment: daily  . Drug use: Not Currently    Types: Marijuana    Comment: used to use cocaine past history    Allergies as of 04/15/2020 - Review Complete 04/15/2020  Allergen Reaction Noted  .  Ciprofloxacin Other (See Comments) 10/01/2014  . Crestor [rosuvastatin calcium] Other (See Comments) 07/29/2015  . Droperidol Other (See Comments) 01/23/2015  . Lipofen  [fenofibrate]  09/28/2014  . Niacin er  09/28/2014  . Pravastatin  10/10/2019    Review of Systems:    All systems reviewed and negative except where noted in HPI.   Physical Exam:  BP 135/88 (BP Location: Left Arm, Patient Position: Sitting, Cuff Size: Normal)   Pulse 80   Temp 98.1 F (36.7 C) (Oral)   Ht 5\' 7"  (1.702 m)   Wt 227 lb 2 oz (103 kg)   BMI 35.57 kg/m  No LMP for male patient.  General:   Alert,  Well-developed, well-nourished, pleasant and cooperative in NAD Head:  Normocephalic and atraumatic. Eyes:  Sclera clear, no icterus.   Conjunctiva pink. Ears:  Normal auditory acuity. Nose:  No deformity, discharge, or lesions. Mouth:  No deformity or lesions,oropharynx pink & moist. Neck:  Supple; no masses or thyromegaly. Lungs:  Respirations even and unlabored.  Clear throughout to auscultation.   No wheezes, crackles, or rhonchi. No acute distress. Heart:  Regular rate and rhythm; no murmurs, clicks, rubs, or gallops. Abdomen:  Normal bowel sounds. Soft, non-tender and non-distended without masses, hepatosplenomegaly or hernias noted.  No guarding or rebound tenderness.   Rectal:  Not performed Msk:  Symmetrical without gross deformities. Good, equal movement & strength bilaterally. Pulses:  Normal pulses noted. Extremities:  No clubbing or edema.  No cyanosis. Neurologic:  Alert and oriented x3;  grossly normal neurologically. Skin:  Intact without significant lesions or rashes. No jaundice. Psych:  Alert and cooperative. Normal mood and affect.  Imaging Studies: Reviewed  Assessment and Plan:   Tim Anderson is a 55 y.o. male with history of coronary disease on DAPT, rheumatoid arthritis maintained on Plaquenil, diabetes, obesity BMI 35.5 is seen in consultation for 2 years history of painless intermittent bright red blood per rectum, sometimes associated with perianal itching, intermittent constipation.  He does have severe sigmoid and descending colon diverticulosis based on CT scan  Since his last colonoscopy was in 2014, I recommended colonoscopy for further evaluation.  However, patient deferred at this time due to lack of insurance. We discussed regarding high-fiber diet, adequate intake of water to keep bowels regular as his rectal bleeding could be secondary to hemorrhoids Information on high-fiber diet provided We will try Anusol cream twice daily per rectum for 10 to 14 days Patient said he will contact my office once he has insurance or after figuring out financial help regarding colonoscopy   Follow up if rectal bleeding is persistent   Cephas Darby, MD

## 2020-04-21 ENCOUNTER — Telehealth: Payer: Self-pay | Admitting: *Deleted

## 2020-04-21 NOTE — Chronic Care Management (AMB) (Signed)
  Care Management   Note  04/21/2020 Name: Tim Anderson MRN: [id number] DOB: [birth year]  Tim Anderson is a 55 y.o. year old male who is a primary care patient of Steele Sizer, MD. I reached out to Vashti Hey by phone today in response to a referral sent by Tim Anderson's PCP, Dr. Ancil Boozer.   Tim Anderson was given information about care management services today including:  1. Care management services include personalized support from designated clinical staff supervised by his physician, including individualized plan of care and coordination with other care providers 2. 24/7 contact phone numbers for assistance for urgent and routine care needs. 3. The patient may stop care management services at any time by phone call to the office staff.  Patient agreed to services and verbal consent obtained.   Follow up plan: Telephone appointment with care management team member scheduled for:05/01/2020  [facility]  Direct dial [phone]

## 2020-04-22 DIAGNOSIS — Z789 Other specified health status: Secondary | ICD-10-CM | POA: Insufficient documentation

## 2020-04-28 ENCOUNTER — Telehealth: Payer: Self-pay

## 2020-04-28 NOTE — Progress Notes (Signed)
    Chronic Care Management Pharmacy Assistant   Name: Tim Anderson  MRN: [id number] DOB: [birth year]  Reason for Encounter: Medication Review/Initial Question for initial visit with clinical pharmacist on 05/01/2020.    Hospital visits:  None in previous 6 months  Medications: Outpatient Encounter Medications as of 04/28/2020  Medication Sig Note  . amLODipine-benazepril (LOTREL) 10-40 MG capsule Take 1 capsule by mouth daily.   Marland Kitchen aspirin EC 81 MG tablet Take 81 mg by mouth. Reported on 07/29/2015   . carvedilol (COREG) 25 MG tablet Take 1 tablet (25 mg total) by mouth 2 (two) times daily with a meal.   . clopidogrel (PLAVIX) 75 MG tablet Take 1 tablet (75 mg total) by mouth daily.   Marland Kitchen ezetimibe (ZETIA) 10 MG tablet Take 1 tablet (10 mg total) by mouth daily. (Patient not taking: Reported on 04/15/2020)   . Fluticasone-Umeclidin-Vilant (TRELEGY ELLIPTA IN) Inhale 1 puff into the lungs daily.   . hydrocortisone (ANUSOL-HC) 2.5 % rectal cream Place 1 application rectally 2 (two) times daily.   . hydroxychloroquine (PLAQUENIL) 200 MG tablet Take 400 tablets by mouth at bedtime.    Marland Kitchen ketorolac (TORADOL) 10 MG tablet Take 1 tablet (10 mg total) by mouth every 6 (six) hours as needed.   . mirabegron ER (MYRBETRIQ) 25 MG TB24 tablet Take 1 tablet (25 mg total) by mouth daily.   . nitroGLYCERIN (NITROSTAT) 0.4 MG SL tablet Place 1 tablet (0.4 mg total) under the tongue every 5 (five) minutes x 3 doses as needed for chest pain.   . pantoprazole (PROTONIX) 20 MG tablet Take 1 tablet (20 mg total) by mouth daily.   . promethazine (PHENERGAN) 12.5 MG tablet Take 1 tablet (12.5 mg total) by mouth every 8 (eight) hours as needed for nausea or vomiting. 07/10/2019: prn  . rizatriptan (MAXALT) 10 MG tablet Take 1 tablet (10 mg total) by mouth as needed for migraine.   . sildenafil (REVATIO) 20 MG tablet TAKE 3-5 TABLET BY MOUTH TWO HOURS BEFORE INTERCOURSE ON AN EMPTY STOMACH. DO NOT TAKE WITH NITRATES    . tamsulosin (FLOMAX) 0.4 MG CAPS capsule Take 1 capsule (0.4 mg total) by mouth daily.    No facility-administered encounter medications on file as of 04/28/2020.    Star Rating Drugs:Amlodipine-benazepril  [facility] Pharmacist Assistant [phone]

## 2020-04-30 NOTE — Progress Notes (Signed)
Chronic Care Management Pharmacy Note  05/01/2020 Name:  Tim Anderson MRN:  [id number] DOB:  06/11/1965  Subjective: Tim Anderson is an 55 y.o. year old male who is a primary patient of Steele Sizer, MD.  The CCM team was consulted for assistance with disease management and care coordination needs.    Engaged with patient by telephone for initial visit in response to provider referral for pharmacy case management and/or care coordination services.   Consent to Services:  The patient was given the following information about Chronic Care Management services today, agreed to services, and gave verbal consent: 1. CCM service includes personalized support from designated clinical staff supervised by the primary care provider, including individualized plan of care and coordination with other care providers 2. 24/7 contact phone numbers for assistance for urgent and routine care needs. 3. Service will only be billed when office clinical staff spend 20 minutes or more in a month to coordinate care. 4. Only one practitioner may furnish and bill the service in a calendar month. 5.The patient may stop CCM services at any time (effective at the end of the month) by phone call to the office staff. 6. The patient will be responsible for cost sharing (co-pay) of up to 20% of the service fee (after annual deductible is met). Patient agreed to services and consent obtained.  Patient Care Team: Steele Sizer, MD as PCP - General (Family Medicine) Ubaldo Glassing Javier Docker, MD as Consulting Physician (Cardiology) Emmaline Kluver., MD as Consulting Physician (Rheumatology) Murrell Redden, MD as Consulting Physician (Urology)  Recent office visits: 04/11/20: Patient presented to Dr. Ancil Boozer for follow-up. A1c worsened to 6.2%. LDL 99. Carvedilol increased to 25 mg twice daily. Lovastatin stopped.   Recent consult visits: 04/22/20: Patient presented to Hassell Done, NP (Cardiology) for follow-up.  Patient started on Repatha 140 mg every 14 days. ented to Hassell Done, NP (Cardiology) for follow-up.  02/2520: Patient presented to Hassell Done, NP (Cardiology) for follow-up. Patient started on Lovastatin, Lotrel increased to 10-40 mg daily.   Hospital visits: None in previous 6 months  Objective:  Lab Results  Component Value Date   CREATININE 0.61 (L) 04/11/2020   BUN 9 04/11/2020   GFRNONAA 112 04/11/2020   GFRAA 129 04/11/2020   NA 140 04/11/2020   K 4.8 04/11/2020   CALCIUM 9.6 04/11/2020   CO2 31 04/11/2020    Lab Results  Component Value Date/Time   HGBA1C 6.2 (A) 04/11/2020 07:50 AM   HGBA1C 5.6 10/10/2019 08:15 AM   HGBA1C 6.1 (H) 04/12/2019 09:13 AM   HGBA1C 5.9 (H) 06/09/2018 08:29 AM   HGBA1C 6.4 (H) 08/29/2011 04:53 AM   MICROALBUR 1.8 10/10/2019 10:07 AM   MICROALBUR 20 07/05/2016 11:53 AM    Last diabetic Eye exam:  Lab Results  Component Value Date/Time   HMDIABEYEEXA No Retinopathy 12/23/2015 12:00 AM   HMDIABEYEEXA No Retinopathy 12/23/2015 12:00 AM    Last diabetic Foot exam: No results found for: HMDIABFOOTEX   Lab Results  Component Value Date   CHOL 157 04/11/2020   HDL 31 (L) 04/11/2020   LDLCALC 99 04/11/2020   TRIG 177 (H) 04/11/2020   CHOLHDL 5.1 (H) 04/11/2020    Hepatic Function Latest Ref Rng & Units 04/11/2020 06/09/2018 11/14/2017  Total Protein 6.1 - 8.1 g/dL 7.2 6.7 6.9  Albumin 3.5 - 5.0 g/dL - - 3.9  AST 10 - 35 U/L $Remo'13 12 20  'eftcP$ ALT 9 - 46 U/L 17 15  22  Alk Phosphatase 38 - 126 U/L - - 60  Total Bilirubin 0.2 - 1.2 mg/dL 0.3 0.3 0.8    Lab Results  Component Value Date/Time   TSH 3.71 10/23/2012 01:19 PM   TSH 5.50 (H) 10/05/2012 08:35 PM    CBC Latest Ref Rng & Units 03/15/2019 03/14/2019 03/13/2019  WBC 4.0 - 10.5 K/uL 12.2(H) 12.1(H) 13.3(H)  Hemoglobin 13.0 - 17.0 g/dL 13.5 13.7 12.8(L)  Hematocrit 39.0 - 52.0 % 42.4 42.0 39.3  Platelets 150 - 400 K/uL 351 366 365    No results found for: VD25OH  Clinical ASCVD:  Yes  The ASCVD Risk score Mikey Bussing DC Jr., et al., 2013) failed to calculate for the following reasons:   The patient has a prior MI or stroke diagnosis    Depression screen [facility] 2/9 04/11/2020 10/10/2019 07/10/2019  Decreased Interest 0 0 0  Down, Depressed, Hopeless 0 0 0  PHQ - 2 Score 0 0 0  Altered sleeping - 1 1  Tired, decreased energy - 2 1  Change in appetite - 2 0  Feeling bad or failure about yourself  - 1 0  Trouble concentrating - 0 0  Moving slowly or fidgety/restless - 0 0  Suicidal thoughts - 0 0  PHQ-9 Score - 6 2  Difficult doing work/chores - Somewhat difficult Not difficult at all  Some recent data might be hidden     Social History   Tobacco Use  Smoking Status Former Smoker  . Packs/day: 1.00  . Years: 30.00  . Pack years: 30.00  . Types: Cigarettes  . Start date: 09/30/1976  . Quit date: 02/07/2020  . Years since quitting: 0.2  Smokeless Tobacco Never Used  Tobacco Comment   he quit    BP Readings from Last 3 Encounters:  04/15/20 135/88  04/11/20 128/78  10/10/19 130/60   Pulse Readings from Last 3 Encounters:  04/15/20 80  04/11/20 87  10/10/19 81   Wt Readings from Last 3 Encounters:  04/15/20 227 lb 2 oz (103 kg)  04/11/20 229 lb (103.9 kg)  10/10/19 213 lb 11.2 oz (96.9 kg)    Assessment/Interventions: Review of patient past medical history, allergies, medications, health status, including review of consultants reports, laboratory and other test data, was performed as part of comprehensive evaluation and provision of chronic care management services.   SDOH:  (Social Determinants of Health) assessments and interventions performed: Yes SDOH Interventions   Flowsheet Row Most Recent Value  SDOH Interventions   Financial Strain Interventions Intervention Not Indicated      CCM Care Plan  Allergies  Allergen Reactions  . Ciprofloxacin Other (See Comments)    Body aches  . Crestor [Rosuvastatin Calcium] Other (See Comments)    Muscles  aches  . Droperidol Other (See Comments)    Heart stopped  . Lipofen  [Fenofibrate]     muscle aches muscle aches  . Niacin Er     flushed  . Pravastatin     Headache     Medications Reviewed Today    Reviewed by Germaine Pomfret, [facility] (Pharmacist) on 05/01/20 at 1113  Med List Status: <None>  Medication Order Taking? Sig Documenting Provider Last Dose Status Informant  amLODipine-benazepril (LOTREL) 10-40 MG capsule [id number]  Take 1 capsule by mouth daily. [provider]  Active   aspirin EC 81 MG tablet [id number]  Take 81 mg by mouth. Reported on 07/29/2015 [provider]  Active Spouse/Significant Other  Med Note Luan Pulling, CHRISTINA   Mon Mar 12, 2019  8:22 PM)    carvedilol (COREG) 25 MG tablet [id number]  Take 1 tablet (25 mg total) by mouth 2 (two) times daily with a meal. Steele Sizer, MD  Active   clopidogrel (PLAVIX) 75 MG tablet [id number]  Take 1 tablet (75 mg total) by mouth daily. Steele Sizer, MD  Active   Evolocumab [facility] SURECLICK) 767 MG/ML Darden Palmer [id number] Yes Inject 140 mg into the skin every 14 (fourteen) days. [provider]  Active            Med Note Rudy Jew May 01, 2020 11:12 AM) Prescribed by Dr. Nehemiah Massed. PAP is in progress. As of 3/17, patient has not started taking this yet.  Fluticasone-Umeclidin-Vilant (TRELEGY ELLIPTA IN) [id number]  Inhale 1 puff into the lungs daily. [provider]  Active Spouse/Significant Other  hydrocortisone (ANUSOL-HC) 2.5 % rectal cream [id number]  Place 1 application rectally 2 (two) times daily. Lin Landsman, MD  Active   hydroxychloroquine (PLAQUENIL) 200 MG tablet [id number]  Take 400 tablets by mouth at bedtime.  Emmaline Kluver., MD  Active Spouse/Significant Other  ketorolac (TORADOL) 10 MG tablet [id number]  Take 1 tablet (10 mg total) by mouth every 6 (six) hours as needed. Steele Sizer, MD  Active   mirabegron ER (MYRBETRIQ) 25 MG TB24  tablet [id number]  Take 1 tablet (25 mg total) by mouth daily. Zara Council A, PA-C  Active   nitroGLYCERIN (NITROSTAT) 0.4 MG SL tablet [id number]  Place 1 tablet (0.4 mg total) under the tongue every 5 (five) minutes x 3 doses as needed for chest pain. Sidney Ace, MD  Active   pantoprazole (PROTONIX) 20 MG tablet [id number]  Take 1 tablet (20 mg total) by mouth daily. Steele Sizer, MD  Active   promethazine (PHENERGAN) 12.5 MG tablet [id number]  Take 1 tablet (12.5 mg total) by mouth every 8 (eight) hours as needed for nausea or vomiting. Steele Sizer, MD  Active Spouse/Significant Other           Med Note (JEFFRIES, Glenice Laine Jul 10, 2019  7:48 AM) prn  rizatriptan (MAXALT) 10 MG tablet [id number]  Take 1 tablet (10 mg total) by mouth as needed for migraine. Steele Sizer, MD  Active   sildenafil (REVATIO) 20 MG tablet [id number]  TAKE 3-5 TABLET BY MOUTH TWO HOURS BEFORE INTERCOURSE ON AN EMPTY STOMACH. DO NOT TAKE WITH NITRATES McGowan, Shannon A, PA-C  Active   tamsulosin (FLOMAX) 0.4 MG CAPS capsule [id number]  Take 1 capsule (0.4 mg total) by mouth daily. Nori Riis, PA-C  Active           Patient Active Problem List   Diagnosis Date Noted  . History of diabetes mellitus, type II 10/10/2019  . Coronary artery disease involving native coronary artery of native heart 04/04/2019  . History of non-ST elevation myocardial infarction (NSTEMI) 03/12/2019  . 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 Atherosclerosis of aorta ([place]) 06/09/2018  . Mediastinal lymphadenopathy 06/09/2018  . Lung nodules 06/09/2018  . Arthralgia of left temporomandibular joint 12/08/2017  . Mucopurulent chronic bronchitis ([place]) 12/08/2017  . Mild left ventricular systolic dysfunction 57/26/2035  . Grade II diastolic dysfunction 59/74/1638  . Mild mitral regurgitation 07/05/2017  . Mild tricuspid regurgitation 07/05/2017  . History of left hip  replacement 12/27/2016  . Angina pectoris ([place]) 10/27/2016  . Carpal tunnel syndrome on  both sides 04/07/2016  . Primary osteoarthritis involving multiple joints 07/29/2015  . Arthritis of knee, degenerative 02/03/2015  . Benign prostatic hyperplasia 09/28/2014  . Chronic obstructive pulmonary disease ([place]) 09/28/2014  . Drug abuse ([place]) 09/28/2014  . Deflected nasal septum 09/28/2014  . Dyslipidemia 09/28/2014  . Dysmetabolic syndrome 25/42/7062  . GERD (gastroesophageal reflux disease) 09/28/2014  . H/O renal calculi 09/28/2014  . HTN (hypertension) 09/28/2014  . Male hypogonadism 09/28/2014  . Failure of erection 09/28/2014  . Obstructive sleep apnea syndrome 09/28/2014  . Depression with anxiety 09/28/2014  . Obesity (BMI 30-39.9) 09/28/2014  . Migraine without aura and responsive to treatment 09/28/2014  . Perennial allergic rhinitis with seasonal variation 09/28/2014  . Elevated platelet count 09/28/2014  . Tobacco abuse 09/28/2014  . Cerebrovascular accident (CVA) ([place]) 07/27/2013  . History of TIA (transient ischemic attack) 09/20/2011  . Rheumatoid arthritis involving multiple joints ([place]) 07/31/2009    Immunization History  Administered Date(s) Administered  . Influenza Split 11/11/2009, 02/22/2012  . Influenza, Seasonal, Injecte, Preservative Fre 11/18/2010  . Influenza,inj,Quad PF,6+ Mos 10/01/2014, 11/04/2014, 10/29/2015, 12/28/2016, 12/08/2017, 10/10/2019  . Influenza-Unspecified 10/11/2013  . Pneumococcal Conjugate-13 07/05/2016  . Pneumococcal Polysaccharide-23 04/25/2009, 11/04/2014  . Tdap 04/25/2009, 03/06/2019    Conditions to be addressed/monitored:  Hypertension, Hyperlipidemia, Heart Failure, Coronary Artery Disease, GERD, COPD, Depression, Anxiety, Osteoarthritis, Tobacco use, BPH and Rheumatoid Arthritis  There are no care plans that you recently modified to display for this patient.    Medication Assistance: Application for Repatha  medication  assistance program. in process.  Anticipated assistance start date TBD. Application was sent in mail by Cardiology.  See plan of care for additional detail.  Patient's preferred pharmacy is:  [place], [place] - [address] [address] Phone: [phone] Fax: [place], [place] [address] Phone: [phone] Fax: [phone]  Uses pill box? Yes Pt endorses 100% compliance  We discussed: Current pharmacy is preferred with insurance plan and patient is satisfied with pharmacy services Patient decided to: Continue current medication management strategy  Care Plan and Follow Up Patient Decision:  Patient requests no follow-up at this time.  Plan: No further follow up required: Patient is not eligible for CCM.  [facility] [phone]  Current Barriers:  . Unable to independently afford treatment regimen  Pharmacist Clinical Goal(s):  Marland Kitchen Patient will verbalize ability to afford treatment regimen through collaboration with PharmD and provider.   Interventions: . 1:1 collaboration with Steele Sizer, MD regarding development and update of comprehensive plan of care as evidenced by provider attestation and co-signature . Inter-disciplinary care team collaboration (see longitudinal plan of care) . Comprehensive medication review performed; medication list updated in electronic medical record  Hyperlipidemia: (LDL goal < 70) -Uncontrolled -Current treatment: . Repatha 140 mg daily (Has not started yet)  -Current antiplatelet treatment: . Aspirin 81 mg daily  . Clopidogrel 75 mg daily  -Medications previously tried: Atorvastatin, pravastatin, rosuvasttin  -Assessed patient finances. Cardiology mailed out patient assistance application, but patient has not received. Instructed  patient to inform me if he does not receive paperwork or needs assistance with filling it out.  COPD (Goal: control symptoms and prevent exacerbations) -Controlled -Current treatment  . Trelegy 1 puff daily -Medications previously tried: NA  -Patient reports consistent use of maintenance inhaler -Frequency of rescue inhaler  use: NA -Assessed patient finances. Patient will likely need assistance but is ok for now.  Patient has extra supply of inhalers he received from ex-wife. Advised patient to utilize coupon card when he next needs to fill his inhaler.   Patient Goals/Self-Care Activities . Patient will:  - collaborate with provider on medication access solutions  Follow Up Plan: No further follow up required: Patient is not eligible for CCM.

## 2020-04-30 NOTE — Chronic Care Management (AMB) (Signed)
05/01/2020- Called patient to attempt and complete initial visit questions and remind of appointment with Junius Argyle, CPP 05/01/2020 @ 11am. No answer, left message of appointment date and time, also requested to have all medications and supplements near during telephone visit.   Pattricia Boss, [place] Pharmacist Assistant [phone]

## 2020-05-01 ENCOUNTER — Ambulatory Visit: Payer: Self-pay

## 2020-05-01 DIAGNOSIS — J411 Mucopurulent chronic bronchitis: Secondary | ICD-10-CM

## 2020-05-01 DIAGNOSIS — I252 Old myocardial infarction: Secondary | ICD-10-CM

## 2020-05-14 ENCOUNTER — Telehealth: Payer: Self-pay

## 2020-05-14 NOTE — Progress Notes (Signed)
    Chronic Care Management Pharmacy Assistant   Name: Tim Anderson  MRN: [id number] DOB: 20-Jul-1965    Reason for Encounter: Medication Review   Please Void note,open in error.  [place] Pharmacist Assistant [phone]

## 2020-06-03 ENCOUNTER — Other Ambulatory Visit: Payer: Self-pay | Admitting: Urology

## 2020-07-11 ENCOUNTER — Other Ambulatory Visit: Payer: Self-pay | Admitting: Family Medicine

## 2020-07-11 DIAGNOSIS — I252 Old myocardial infarction: Secondary | ICD-10-CM

## 2020-07-11 DIAGNOSIS — I209 Angina pectoris, unspecified: Secondary | ICD-10-CM

## 2020-07-11 DIAGNOSIS — I700 Atherosclerosis of aorta: Secondary | ICD-10-CM

## 2020-07-11 NOTE — Telephone Encounter (Signed)
Requested Prescriptions  Pending Prescriptions Disp Refills  . clopidogrel (PLAVIX) 75 MG tablet [Pharmacy Med Name: CLOPIDOGREL 75 MG TABLET] 90 tablet 0    Sig: Take 1 tablet (75 mg total) by mouth daily.     Hematology: Antiplatelets - clopidogrel Failed - 07/11/2020 11:50 AM      Failed - Evaluate AST, ALT within 2 months of therapy initiation.      Failed - HCT in normal range and within 180 days    HCT  Date Value Ref Range Status  03/15/2019 42.4 39.0 - 52.0 % Final   Hematocrit  Date Value Ref Range Status  07/05/2016 39.6 37.5 - 51.0 % Final         Failed - HGB in normal range and within 180 days    Hemoglobin  Date Value Ref Range Status  03/15/2019 13.5 13.0 - 17.0 g/dL Final  07/05/2016 12.3 (L) 13.0 - 17.7 g/dL Final         Failed - PLT in normal range and within 180 days    Platelets  Date Value Ref Range Status  03/15/2019 351 150 - 400 K/uL Final  07/05/2016 370 150 - 379 x10E3/uL Final         Passed - ALT in normal range and within 360 days    ALT  Date Value Ref Range Status  04/11/2020 17 9 - 46 U/L Final   SGPT (ALT)  Date Value Ref Range Status  10/23/2012 30 12 - 78 U/L Final         Passed - AST in normal range and within 360 days    AST  Date Value Ref Range Status  04/11/2020 13 10 - 35 U/L Final   SGOT(AST)  Date Value Ref Range Status  10/23/2012 20 15 - 37 Unit/L Final         Passed - Valid encounter within last 6 months    Recent Outpatient Visits          3 months ago Angina pectoris [facility])   [facility] Steele Sizer, MD   9 months ago Hyperglycemia   [facility] [place], Drue Stager, MD   1 year ago Dyslipidemia associated with type 2 diabetes mellitus [facility])   [facility] Steele Sizer, MD   1 year ago Dyslipidemia associated with type 2 diabetes mellitus [facility])   [facility] Steele Sizer, MD   1 year ago Hospital discharge follow-up    [facility] Steele Sizer, MD      Future Appointments            In 3 months Ancil Boozer, Drue Stager, MD [facility], [facility]

## 2020-08-08 ENCOUNTER — Other Ambulatory Visit: Payer: Self-pay | Admitting: Urology

## 2020-08-08 DIAGNOSIS — N400 Enlarged prostate without lower urinary tract symptoms: Secondary | ICD-10-CM

## 2020-08-13 NOTE — Progress Notes (Signed)
08/14/2020 9:24 AM   Tim Anderson [birth year] [id number]  Referring provider: Steele Sizer, MD [address] [place],  [place] [address]  Urological history: 1. Nephrolithiasis -ESWL 2005 -RUS 12/05/2017 NED  2. BPH with LU TS -PSA pending  -I PSS 19/4 -managed with tamsulosin 0.4 mg   3. Family history of prostate cancer -oldest brother had his prostate removed due to cancer at age 57  4. ED -contributing factors of age, BPH, HTN, HLD, sleep apnea, alcohol abuse, CAD, and smoking  -SHIM 15 -managed with sildenafil 20 mg, on-demand-dosing  Chief Complaint  Patient presents with   Nephrolithiasis     HPI: Tim Anderson is a 55 y.o. male who presents today for follow up.   He is having urgency and nocturia x 2-3.  Patient denies any modifying or aggravating factors.  Patient denies any gross hematuria, dysuria or suprapubic/flank pain.  Patient denies any fevers, chills, nausea or vomiting.     IPSS     Row Name 08/14/20 0800         International Prostate Symptom Score   How often have you had the sensation of not emptying your bladder? About half the time     How often have you had to urinate less than every two hours? Less than half the time     How often have you found you stopped and started again several times when you urinated? About half the time     How often have you found it difficult to postpone urination? More than half the time     How often have you had a weak urinary stream? About half the time     How often have you had to strain to start urination? Less than 1 in 5 times     How many times did you typically get up at night to urinate? 3 Times     Total IPSS Score 19           Quality of Life due to urinary symptoms     If you were to spend the rest of your life with your urinary condition just the way it is now how would you feel about that? Mostly Disatisfied              Score:  1-7 Mild 8-19  Moderate 20-35 Severe   Patient is not having spontaneous erections.   He denies any pain or curvature with erections.  He has diarrhea when he takes sildenafil.    SHIM     Row Name 08/14/20 0851         SHIM: Over the last 6 months:   How do you rate your confidence that you could get and keep an erection? Low     When you had erections with sexual stimulation, how often were your erections hard enough for penetration (entering your partner)? Sometimes (about half the time)     During sexual intercourse, how often were you able to maintain your erection after you had penetrated (entered) your partner? Sometimes (about half the time)     During sexual intercourse, how difficult was it to maintain your erection to completion of intercourse? Slightly Difficult     When you attempted sexual intercourse, how often was it satisfactory for you? Sometimes (about half the time)           SHIM Total Score     SHIM 15  Score: 1-7 Severe ED 8-11 Moderate ED 12-16 Mild-Moderate ED 17-21 Mild ED 22-25 No ED       PMH: Past Medical History:  Diagnosis Date   Allergy    Arthritis    BPH (benign prostatic hyperplasia)    COPD (chronic obstructive pulmonary disease) ([place])    Drug abuse ([place])    Dysmetabolic syndrome X    Erosive gastritis    History of hip replacement 12/2016   History of knee replacement 01/2015   History of left hip replacement 12/27/2016   Hyperlipidemia    Hypertension    Hypogonadism male    Obesity    Obsessive compulsive disorder    OSA (obstructive sleep apnea)    Radiculitis    Stomatitis monilial    Stroke ([place])    Thrombocytosis     Surgical History: Past Surgical History:  Procedure Laterality Date   CORONARY BALLOON ANGIOPLASTY N/A 03/14/2019   Procedure: CORONARY BALLOON ANGIOPLASTY;  Surgeon: Yolonda Kida, MD;  Location: [facility];  Service: Cardiovascular;  Laterality: N/A;   FACIAL RECONSTRUCTION  SURGERY     JOINT REPLACEMENT Left    hip   JOINT REPLACEMENT Right    knee   KNEE SURGERY Right    LEFT HEART CATH AND CORONARY ANGIOGRAPHY N/A 03/14/2019   Procedure: LEFT HEART CATH AND CORONARY ANGIOGRAPHY;  Surgeon: Corey Skains, MD;  Location: [facility];  Service: Cardiovascular;  Laterality: N/A;    Home Medications:  Allergies as of 08/14/2020       Reactions   Ciprofloxacin Other (See Comments)   Body aches   Crestor [rosuvastatin Calcium] Other (See Comments)   Muscles aches   Droperidol Other (See Comments)   Heart stopped   Lipofen  [fenofibrate]    muscle aches muscle aches   Niacin Er    flushed   Pravastatin    Headache        Medication List        Accurate as of August 14, 2020  9:24 AM. If you have any questions, ask your nurse or doctor.          amLODipine-benazepril 10-40 MG capsule Commonly known as: LOTREL Take 1 capsule by mouth daily.   aspirin EC 81 MG tablet Take 81 mg by mouth. Reported on 07/29/2015   carvedilol 25 MG tablet Commonly known as: COREG Take 1 tablet (25 mg total) by mouth 2 (two) times daily with a meal.   clopidogrel 75 MG tablet Commonly known as: PLAVIX Take 1 tablet (75 mg total) by mouth daily.   hydrocortisone 2.5 % rectal cream Commonly known as: ANUSOL-HC Place 1 application rectally 2 (two) times daily.   hydroxychloroquine 200 MG tablet Commonly known as: PLAQUENIL Take 400 tablets by mouth at bedtime.   ketorolac 10 MG tablet Commonly known as: TORADOL Take 1 tablet (10 mg total) by mouth every 6 (six) hours as needed.   mirabegron ER 25 MG Tb24 tablet Commonly known as: MYRBETRIQ Take 1 tablet (25 mg total) by mouth daily.   nitroGLYCERIN 0.4 MG SL tablet Commonly known as: NITROSTAT Place 1 tablet (0.4 mg total) under the tongue every 5 (five) minutes x 3 doses as needed for chest pain.   pantoprazole 20 MG tablet Commonly known as: PROTONIX Take 1 tablet (20 mg total) by  mouth daily.   promethazine 12.5 MG tablet Commonly known as: PHENERGAN Take 1 tablet (12.5 mg total) by mouth every 8 (eight) hours  as needed for nausea or vomiting.   Repatha SureClick 254 MG/ML [place] Generic drug: Evolocumab Inject 140 mg into the skin every 14 (fourteen) days.   rizatriptan 10 MG tablet Commonly known as: MAXALT Take 1 tablet (10 mg total) by mouth as needed for migraine.   sildenafil 20 MG tablet Commonly known as: REVATIO TAKE 3-5 TABLET BY MOUTH TWO HOURS BEFORE INTERCOURSE ON AN EMPTY STOMACH. DO NOT TAKE WITH NITRATES   tamsulosin 0.4 MG Caps capsule Commonly known as: FLOMAX Take 1 capsule (0.4 mg total) by mouth daily.   TRELEGY ELLIPTA IN Inhale 1 puff into the lungs daily.        Allergies:  Allergies  Allergen Reactions   Ciprofloxacin Other (See Comments)    Body aches   Crestor [Rosuvastatin Calcium] Other (See Comments)    Muscles aches   Droperidol Other (See Comments)    Heart stopped   Lipofen  [Fenofibrate]     muscle aches muscle aches   Niacin Er     flushed   Pravastatin     Headache     Family History: Family History  Problem Relation Age of Onset   Diabetes Mother    Diabetes Father    Heart attack Father    Cancer Brother        Skin and Prostate-Oldest Brother   Diabetes Brother     Social History:  reports that he quit smoking about 6 months ago. His smoking use included cigarettes. He started smoking about 43 years ago. He has a 30.00 pack-year smoking history. He has never used smokeless tobacco. He reports current alcohol use of about 4.0 standard drinks of alcohol per week. He reports previous drug use. Drug: Marijuana.  ROS: For pertinent review of systems please refer to history of present illness  Physical Exam: BP 124/82   Pulse 84   Ht 5\' 7"  (1.702 m)   Wt 230 lb (104.3 kg)   BMI 36.02 kg/m   Constitutional:  Well nourished. Alert and oriented, No acute distress. HEENT: [place] AT, mask in place   Trachea midline Cardiovascular: No clubbing, cyanosis, or edema. Respiratory: Normal respiratory effort, no increased work of breathing. GU: No CVA tenderness.  No bladder fullness or masses.  Patient with uncircumcised phallus. Foreskin easily retracted.   Urethral meatus is patent.  No penile discharge. No penile lesions or rashes. Scrotum without lesions, cysts, rashes and/or edema.  Testicles are located scrotally bilaterally. No masses are appreciated in the testicles. Left and right epididymis are normal. Rectal: Patient with  normal sphincter tone. Anus and perineum without scarring or rashes. No rectal masses are appreciated. Prostate is approximately 50 grams, no nodules are appreciated. Seminal vesicles could not be palpated.  Lymph: No inguinal adenopathy. Neurologic: Grossly intact, no focal deficits, moving all 4 extremities. Psychiatric: Normal mood and affect.   Laboratory Data: Lab Results  Component Value Date   CREATININE 0.61 (L) 04/11/2020    Lab Results  Component Value Date   HGBA1C 6.2 (A) 04/11/2020        Component Value Date/Time   CHOL 157 04/11/2020 0833   CHOL 162 07/05/2016 0837   CHOL 160 08/29/2011 0453   HDL 31 (L) 04/11/2020 0833   HDL 30 (L) 07/05/2016 0837   HDL 18 (L) 08/29/2011 0453   CHOLHDL 5.1 (H) 04/11/2020 0833   VLDL 50 (H) 03/13/2019 0210   VLDL 55 (H) 08/29/2011 0453   LDLCALC 99 04/11/2020 0833   LDLCALC 87  08/29/2011 0453    Lab Results  Component Value Date   AST 13 04/11/2020   Lab Results  Component Value Date   ALT 17 04/11/2020    Component     Latest Ref Rng & Units 04/11/2020          Hemoglobin A1C     4.0 - 5.6 % 6.2 (A)  I have reviewed the labs.   Pertinent Imaging: N/A   Assessment & Plan:    1. Nephrolithiasis -asymptomatic   2. BPH with LUTS -PSA pending -refill given for tamsulosin 0.4 mg daily  3. Erectile dysfunction -discussed having a trial of ICI as he experiencing diarrhea with  sildenafil, he deferred -sildenafil managed by cardiology   Return in about 1 year (around 08/14/2021) for IPSS, SHIM, PSA and exam.  These notes generated with voice recognition software. I apologize for typographical errors.  Zara Council, PA-C  [facility] [address]  [place], [place] [address] [phone]

## 2020-08-14 ENCOUNTER — Ambulatory Visit (INDEPENDENT_AMBULATORY_CARE_PROVIDER_SITE_OTHER): Payer: Self-pay | Admitting: Urology

## 2020-08-14 ENCOUNTER — Other Ambulatory Visit: Payer: Self-pay

## 2020-08-14 ENCOUNTER — Encounter: Payer: Self-pay | Admitting: Urology

## 2020-08-14 VITALS — BP 124/82 | HR 84 | Ht 67.0 in | Wt 230.0 lb

## 2020-08-14 DIAGNOSIS — N401 Enlarged prostate with lower urinary tract symptoms: Secondary | ICD-10-CM

## 2020-08-14 DIAGNOSIS — N200 Calculus of kidney: Secondary | ICD-10-CM

## 2020-08-14 DIAGNOSIS — N400 Enlarged prostate without lower urinary tract symptoms: Secondary | ICD-10-CM

## 2020-08-14 DIAGNOSIS — N529 Male erectile dysfunction, unspecified: Secondary | ICD-10-CM

## 2020-08-14 DIAGNOSIS — N138 Other obstructive and reflux uropathy: Secondary | ICD-10-CM

## 2020-08-14 MED ORDER — TAMSULOSIN HCL 0.4 MG PO CAPS: 0.4000 mg | ORAL_CAPSULE | Freq: Every day | ORAL | 3 refills | Status: AC

## 2020-08-15 LAB — PSA: Prostate Specific Ag, Serum: 0.9 ng/mL (ref 0.0–4.0)

## 2020-10-06 ENCOUNTER — Other Ambulatory Visit: Payer: Self-pay | Admitting: Urology

## 2020-10-06 NOTE — Telephone Encounter (Signed)
Rx refused: 3. Erectile dysfunction -discussed having a trial of ICI as he experiencing diarrhea with sildenafil, he deferred -sildenafil managed by cardiology

## 2020-10-09 ENCOUNTER — Ambulatory Visit: Payer: Self-pay | Admitting: Family Medicine

## 2020-11-26 ENCOUNTER — Other Ambulatory Visit: Payer: Self-pay

## 2020-11-26 ENCOUNTER — Encounter: Payer: Self-pay | Admitting: Internal Medicine

## 2020-11-26 NOTE — Progress Notes (Signed)
This encounter was created in error - please disregard.

## 2020-11-27 ENCOUNTER — Encounter: Payer: Self-pay | Admitting: Internal Medicine

## 2020-11-27 ENCOUNTER — Ambulatory Visit: Payer: Self-pay | Admitting: Unknown Physician Specialty

## 2020-11-27 ENCOUNTER — Ambulatory Visit (INDEPENDENT_AMBULATORY_CARE_PROVIDER_SITE_OTHER): Payer: Self-pay | Admitting: Internal Medicine

## 2020-11-27 VITALS — BP 140/72 | HR 92 | Temp 98.1°F | Resp 16 | Ht 67.0 in | Wt 226.5 lb

## 2020-11-27 DIAGNOSIS — J069 Acute upper respiratory infection, unspecified: Secondary | ICD-10-CM

## 2020-11-27 DIAGNOSIS — K1121 Acute sialoadenitis: Secondary | ICD-10-CM

## 2020-11-27 DIAGNOSIS — H8302 Labyrinthitis, left ear: Secondary | ICD-10-CM

## 2020-11-27 DIAGNOSIS — B9689 Other specified bacterial agents as the cause of diseases classified elsewhere: Secondary | ICD-10-CM

## 2020-11-27 MED ORDER — ALBUTEROL SULFATE HFA 108 (90 BASE) MCG/ACT IN AERS
2.0000 | INHALATION_SPRAY | Freq: Four times a day (QID) | RESPIRATORY_TRACT | 0 refills | Status: AC | PRN
Start: 2020-11-27 — End: ?

## 2020-11-27 MED ORDER — AMOXICILLIN-POT CLAVULANATE 875-125 MG PO TABS: 1.0000 | ORAL_TABLET | Freq: Two times a day (BID) | ORAL | 0 refills | Status: AC

## 2020-11-27 MED ORDER — METHYLPREDNISOLONE 4 MG PO TBPK
ORAL_TABLET | ORAL | 0 refills | Status: DC
Start: 2020-11-27 — End: 2021-01-26

## 2020-11-27 MED ORDER — METHYLPREDNISOLONE 4 MG PO TBPK: ORAL_TABLET | ORAL | 0 refills | Status: DC

## 2020-11-27 NOTE — Patient Instructions (Addendum)
It was great seeing you today!  Plan discussed at today's visit: -Steroid and antibiotic sent to pharmacy, this should help with all of your symptoms. -Albuterol (rescue inhaler) sent to pharmacy as well. -Use warm compresses on facial swelling, stay hydrated and can use lemon drops to help produce more saliva.  Follow up in: 2 weeks  Take care and let us know if you have any questions or concerns prior to your next visit.  Dr. Rosana Berger  Labyrinthitis Labyrinthitis is an infection of the inner ear. Your inner ear is made up of tubes and canals (labyrinth). These are filled with fluid. There are nerve cells in your inner ear that send hearing and balance signals to your brain. When germs get inside the tubes and canals, they harm the nerve cells that send signals to the brain. This condition often starts all of a sudden and goes away in a few weeks with treatment. If the infection harms parts of the tubes and canals, some symptoms may last for a long time. What are the causes? This condition can be caused by viruses, such as one that causes: Mononucleosis, also called mono. Measles or mumps. The flu. Herpes. This condition can also be caused by bacteria that spread from an infection in the brain or the middle ear. What increases the risk? You may be at greater risk for this condition if: You had a mouth, nose, throat, or ear infection not long ago. You drink a lot of alcohol. You smoke. You use certain drugs. You are feeling tired (fatigued). You have a lot of stress. You have allergies. What are the signs or symptoms? Symptoms of this condition often start all of a sudden. The symptoms may range from mild to very bad, and may include: Dizziness. Hearing loss. A feeling that you or the things around you are moving when they are not (vertigo). Ringing in your ear (tinnitus). A feeling like you may vomit (nausea). Vomiting. Trouble focusing your eyes. If you have symptoms that  last a long time, they may include: Feeling tired. Confusion. Hearing loss. Ringing in your ear. Poor balance. A feeling that you or the things around you are moving when they are not if you move your head suddenly. How is this treated? Treatment depends on the cause. If your condition is caused by bacteria, you may need antibiotic medicine. If it is caused by a virus, it may get better on its own. No matter the cause, you may be treated with: Medicines to: Stop dizziness. Stop the feeling that you may vomit. Reduce irritation and swelling. Get better faster. Fluids through an IV tube. These may be given at a hospital. You may need IV fluids if you have very bad vomiting or feelings like you may vomit. Physical therapy. A therapist can teach you exercises to help you get used to feeling dizzy. You may need this if you have dizziness that does not go away. Follow these instructions at home: Medicines Take over-the-counter and prescription medicines only as told by your doctor. If you were prescribed an antibiotic medicine, take it as told by your doctor. Do not stop taking it even if you start to feel better. Activity Rest as told by your doctor. Limit the things you do as told. Return to your normal activities when your doctor says that it is safe. Do not make any sudden movements until you no longer feel dizzy. Do exercises as told by your doctor. General instructions Avoid loud noises and bright  lights. Do not drive until your doctor says that it is safe for you. Drink enough fluid to keep your pee (urine) pale yellow. Keep all follow-up visits. Contact a doctor if: Your symptoms do not get better with medicine. You do not get better after 2 weeks. You have a fever. Get help right away if: You keep feeling like you may vomit. You keep vomiting. You are very dizzy. Your hearing gets much worse all of a sudden. Summary Labyrinthitis is an infection of the inner ear. This  condition is often caused by a virus. Symptoms include dizziness, hearing loss, and ringing in the ears. Treatment depends on the cause. Do what your doctor tells you. This information is not intended to replace advice given to you by your health care provider. Make sure you discuss any questions you have with your health care provider. Document Revised: 03/12/2020 Document Reviewed: 03/12/2020 Elsevier Patient Education  2022 [employer].

## 2020-11-27 NOTE — Progress Notes (Signed)
Acute Office Visit  Subjective:    Patient ID: Gopal Malter, male    DOB: Feb 28, 1965, 55 y.o.   MRN: [id number]  Chief Complaint  Patient presents with   Dizziness   Ear Pain    Swollen lymph nodes    HPI Patient is in today for URI Compliant: Patient states that he was exposed to a fine dust while at work about 1 month ago, which then seemed to cause allergy symptoms, such as sore throat, sneezing, productive green cough. He took a covid test 2 weeks ago which was negative and again last week which was also negative. Today he states his symptoms are improving but lingering. He also started having some bilateral ear congestion, vertigo and left jaw swelling a few days ago. He describes the vertigo as a room spinning sensation, worsening with turning the head or changing positions. This lasts 3-4 minutes and spontaneously resolves. He denies ear pain, changes in hearing, tinnitus or drainage from the ear. Regarding his jaw swelling, it is on the left side and has been improving over the last few days but is still present and causing a pressure like discomfort. He does have a history of similar symptoms previously, treated with an antibiotic with resolution at that time. It was recommend that he see ENT for this but he has not followed up with them due to lack of insurance. He does have accompanying dry mouth symptoms.   -Worst symptom: ear fullness, dizziness, jaw swelling on left -Fever: no -Cough: yes -Shortness of breath: yes -Wheezing: yes -Chest pain: no -Chest tightness: yes -Chest congestion: no -Nasal congestion: no -Runny nose: no -Post nasal drip: yes; at nioght  -Sneezing: no -Sore throat: yes -Swollen glands: yes -Sinus pressure: no -Headache: yes -Face pain: no -Toothache: no -Ear pain: no    -Ear pressure: yes bilateral L>R -Eyes red/itching:no -Eye drainage/crusting: no  -Vomiting: no -Rash: no -Fatigue: no -Sick contacts: no -Strep contacts: no   -Context: fluctuating -Recurrent sinusitis: yes -Relief with OTC cold/cough medications: yes   -Treatments attempted: mucinex and anti-histamine; Tylenol and Ibuprofen    Past Medical History:  Diagnosis Date   Allergy    Arthritis    BPH (benign prostatic hyperplasia)    COPD (chronic obstructive pulmonary disease) ([place])    Drug abuse ([place])    Dysmetabolic syndrome X    Erosive gastritis    History of hip replacement 12/2016   History of knee replacement 01/2015   History of left hip replacement 12/27/2016   Hyperlipidemia    Hypertension    Hypogonadism male    Obesity    Obsessive compulsive disorder    OSA (obstructive sleep apnea)    Radiculitis    Stomatitis monilial    Stroke ([place])    Thrombocytosis     Past Surgical History:  Procedure Laterality Date   CORONARY BALLOON ANGIOPLASTY N/A 03/14/2019   Procedure: CORONARY BALLOON ANGIOPLASTY;  Surgeon: Yolonda Kida, MD;  Location: [facility];  Service: Cardiovascular;  Laterality: N/A;   FACIAL RECONSTRUCTION SURGERY     JOINT REPLACEMENT Left    hip   JOINT REPLACEMENT Right    knee   KNEE SURGERY Right    LEFT HEART CATH AND CORONARY ANGIOGRAPHY N/A 03/14/2019   Procedure: LEFT HEART CATH AND CORONARY ANGIOGRAPHY;  Surgeon: Corey Skains, MD;  Location: [facility];  Service: Cardiovascular;  Laterality: N/A;    Family History  Problem Relation Age of Onset  Diabetes Mother    Diabetes Father    Heart attack Father    Cancer Brother        Skin and Prostate-Oldest Brother   Diabetes Brother     Social History   Socioeconomic History   Marital status: Married    Spouse name: Lattie Haw    Number of children: 3   Years of education: Not on file   Highest education level: 12th grade  Occupational History   Occupation: Architect     Comment: self employed   Tobacco Use   Smoking status: Former    Packs/day: 1.00    Years: 30.00    Pack years: 30.00    Types: Cigarettes     Start date: 09/30/1976    Quit date: 02/07/2020    Years since quitting: 0.8   Smokeless tobacco: Never   Tobacco comments:    he quit   Vaping Use   Vaping Use: Never used  Substance and Sexual Activity   Alcohol use: Yes    Alcohol/week: 4.0 standard drinks    Types: 4 Cans of beer per week    Comment: daily   Drug use: Not Currently    Types: Marijuana    Comment: used to use cocaine past history   Sexual activity: Yes    Partners: Female  Other Topics Concern   Not on file  Social History Narrative   On his 4th marriage   Social Determinants of Health   Financial Resource Strain: Low Risk    Difficulty of Paying Living Expenses: Not hard at all  Food Insecurity: No Food Insecurity   Worried About Charity fundraiser in the Last Year: Never true   Arboriculturist in the Last Year: Never true  Transportation Needs: No Transportation Needs   Lack of Transportation (Medical): No   Lack of Transportation (Non-Medical): No  Physical Activity: Sufficiently Active   Days of Exercise per Week: 5 days   Minutes of Exercise per Session: 150+ min  Stress: No Stress Concern Present   Feeling of Stress : Not at all  Social Connections: Not on file  Intimate Partner Violence: Not At Risk   Fear of Current or Ex-Partner: No   Emotionally Abused: No   Physically Abused: No   Sexually Abused: No    Outpatient Medications Prior to Visit  Medication Sig Dispense Refill   amLODipine-benazepril (LOTREL) 10-40 MG capsule Take 1 capsule by mouth daily.     aspirin EC 81 MG tablet Take 81 mg by mouth. Reported on 07/29/2015     carvedilol (COREG) 25 MG tablet Take 1 tablet (25 mg total) by mouth 2 (two) times daily with a meal. 180 tablet 1   clopidogrel (PLAVIX) 75 MG tablet Take 1 tablet (75 mg total) by mouth daily. 90 tablet 0   Evolocumab (REPATHA SURECLICK) 193 MG/ML SOAJ Inject 140 mg into the skin every 14 (fourteen) days.     ezetimibe (ZETIA) 10 MG tablet Take 1 tablet by  mouth daily.     Fluticasone-Umeclidin-Vilant (TRELEGY ELLIPTA IN) Inhale 1 puff into the lungs daily.     hydrocortisone (ANUSOL-HC) 2.5 % rectal cream Place 1 application rectally 2 (two) times daily. 30 g 0   hydroxychloroquine (PLAQUENIL) 200 MG tablet Take 400 tablets by mouth at bedtime.      ketorolac (TORADOL) 10 MG tablet Take 1 tablet (10 mg total) by mouth every 6 (six) hours as needed. 30 tablet 1   mirabegron  ER (MYRBETRIQ) 25 MG TB24 tablet Take 1 tablet (25 mg total) by mouth daily. 28 tablet 0   nitroGLYCERIN (NITROSTAT) 0.4 MG SL tablet Place 1 tablet (0.4 mg total) under the tongue every 5 (five) minutes x 3 doses as needed for chest pain. 100 tablet 0   pantoprazole (PROTONIX) 20 MG tablet Take 1 tablet (20 mg total) by mouth daily. 90 tablet 1   promethazine (PHENERGAN) 12.5 MG tablet Take 1 tablet (12.5 mg total) by mouth every 8 (eight) hours as needed for nausea or vomiting. 20 tablet 0   rizatriptan (MAXALT) 10 MG tablet Take 1 tablet (10 mg total) by mouth as needed for migraine. 10 tablet 0   sildenafil (REVATIO) 20 MG tablet TAKE 3-5 TABLET BY MOUTH TWO HOURS BEFORE INTERCOURSE ON AN EMPTY STOMACH. DO NOT TAKE WITH NITRATES 30 tablet 0   tamsulosin (FLOMAX) 0.4 MG CAPS capsule Take 1 capsule (0.4 mg total) by mouth daily. 90 capsule 3   No facility-administered medications prior to visit.    Allergies  Allergen Reactions   Ciprofloxacin Other (See Comments)    Body aches   Crestor [Rosuvastatin Calcium] Other (See Comments)    Muscles aches   Droperidol Other (See Comments)    Heart stopped   Lipofen  [Fenofibrate]     muscle aches muscle aches   Niacin Er     flushed   Pravastatin     Headache     Review of Systems  Constitutional:  Negative for chills and fever.  HENT:  Positive for facial swelling, sinus pressure and sore throat. Negative for congestion, drooling, ear discharge, ear pain, hearing loss, postnasal drip, rhinorrhea, sinus pain, tinnitus  and trouble swallowing.   Respiratory:  Positive for cough, chest tightness, shortness of breath and wheezing.   Cardiovascular:  Negative for chest pain.  Gastrointestinal:  Negative for abdominal pain, diarrhea, nausea and vomiting.  Skin: Negative.   Neurological:  Positive for dizziness and headaches.      Objective:    Physical Exam Constitutional:      Appearance: Normal appearance.  HENT:     Head: Normocephalic and atraumatic.     Comments: Left mild swelling over parotid gland with associated facial tenderness. No erythema. Dry mouth noted, no purulent drainage.     Right Ear: Tympanic membrane, ear canal and external ear normal.     Left Ear: Tympanic membrane, ear canal and external ear normal.     Nose: Nose normal.     Mouth/Throat:     Mouth: Mucous membranes are dry.     Pharynx: Oropharynx is clear.  Eyes:     Conjunctiva/sclera: Conjunctivae normal.     Pupils: Pupils are equal, round, and reactive to light.  Cardiovascular:     Rate and Rhythm: Normal rate and regular rhythm.  Pulmonary:     Effort: Pulmonary effort is normal.     Breath sounds: Normal breath sounds. No wheezing, rhonchi or rales.  Musculoskeletal:     Cervical back: Neck supple. No tenderness.     Right lower leg: No edema.     Left lower leg: No edema.  Lymphadenopathy:     Cervical: Cervical adenopathy present.  Skin:    General: Skin is warm and dry.  Neurological:     General: No focal deficit present.     Mental Status: He is alert. Mental status is at baseline.  Psychiatric:        Mood and Affect: Mood normal.  Behavior: Behavior normal.    BP 140/72   Pulse 92   Temp 98.1 F (36.7 C)   Resp 16   Ht 5\' 7"  (1.702 m)   Wt 226 lb 8 oz (102.7 kg)   SpO2 92%   BMI 35.47 kg/m  Wt Readings from Last 3 Encounters:  08/14/20 230 lb (104.3 kg)  04/15/20 227 lb 2 oz (103 kg)  04/11/20 229 lb (103.9 kg)    Health Maintenance Due  Topic Date Due   COVID-19 Vaccine (1)  Never done   Zoster Vaccines- Shingrix (1 of 2) Never done   INFLUENZA VACCINE  09/15/2020    There are no preventive care reminders to display for this patient.   Lab Results  Component Value Date   TSH 3.71 10/23/2012   Lab Results  Component Value Date   WBC 12.2 (H) 03/15/2019   HGB 13.5 03/15/2019   HCT 42.4 03/15/2019   MCV 85.0 03/15/2019   PLT 351 03/15/2019   Lab Results  Component Value Date   NA 140 04/11/2020   K 4.8 04/11/2020   CO2 31 04/11/2020   GLUCOSE 120 (H) 04/11/2020   BUN 9 04/11/2020   CREATININE 0.61 (L) 04/11/2020   BILITOT 0.3 04/11/2020   ALKPHOS 60 11/14/2017   AST 13 04/11/2020   ALT 17 04/11/2020   PROT 7.2 04/11/2020   ALBUMIN 3.9 11/14/2017   CALCIUM 9.6 04/11/2020   ANIONGAP 7 03/15/2019   Lab Results  Component Value Date   CHOL 157 04/11/2020   Lab Results  Component Value Date   HDL 31 (L) 04/11/2020   Lab Results  Component Value Date   LDLCALC 99 04/11/2020   Lab Results  Component Value Date   TRIG 177 (H) 04/11/2020   Lab Results  Component Value Date   CHOLHDL 5.1 (H) 04/11/2020   Lab Results  Component Value Date   HGBA1C 6.2 (A) 04/11/2020       Assessment & Plan:   1. Viral upper respiratory tract infection/Labyrinthitis of left ear: Symptoms have been improving initially but now lingering. A medrol Dosepak will be sent to his pharmacy as well as a rescue inhaler to use as needed.   - albuterol (VENTOLIN HFA) 108 (90 Base) MCG/ACT inhaler; Inhale 2 puffs into the lungs every 6 (six) hours as needed for wheezing or shortness of breath.  Dispense: 8 g; Refill: 0 - methylPREDNISolone (MEDROL DOSEPAK) 4 MG TBPK tablet; Day 1: Take 8 mg (2 tablets) before breakfast, 4 mg (1 tablet) after lunch, 4 mg (1 tablet) after supper, and 8 mg (2 tablets) at bedtime. Day 2:Take 4 mg (1 tablet) before breakfast, 4 mg (1 tablet) after lunch, 4 mg (1 tablet) after supper, and 8 mg (2 tablets) at bedtime. Day 3: Take 4 mg (1  tablet) before breakfast, 4 mg (1 tablet) after lunch, 4 mg (1 tablet) after supper, and 4 mg (1 tablet) at bedtime. Day 4: Take 4 mg (1 tablet) before breakfast, 4 mg (1 tablet) after lunch, and 4 mg (1 tablet) at bedtime. Day 5: Take 4 mg (1 tablet) before breakfast and 4 mg (1 tablet) at bedtime. Day 6: Take 4 mg (1 tablet) before breakfast.  Dispense: 1 each; Refill: 0  2. Acute bacterial sialadenitis: We will treat with Augmentin BID x 5 days. Discussed referring to ENT, but as the patient does not have insurance we will treat and have him follow up in 2 weeks for recheck.   -  amoxicillin-clavulanate (AUGMENTIN) 875-125 MG tablet; Take 1 tablet by mouth 2 (two) times daily for 5 days.  Dispense: 10 tablet; Refill: 0   Teodora Medici, DO

## 2020-12-11 ENCOUNTER — Ambulatory Visit: Payer: Self-pay | Admitting: Internal Medicine

## 2020-12-17 ENCOUNTER — Telehealth: Payer: Self-pay | Admitting: Family Medicine

## 2020-12-17 DIAGNOSIS — I252 Old myocardial infarction: Secondary | ICD-10-CM

## 2020-12-17 DIAGNOSIS — I10 Essential (primary) hypertension: Secondary | ICD-10-CM

## 2020-12-18 NOTE — Telephone Encounter (Signed)
Lvm for pt to call and schedule an appt  

## 2021-01-23 NOTE — Progress Notes (Signed)
Name: Tim Anderson   MRN: [id number]    DOB: April 05, 1965   Date:01/26/2021       Progress Note  Subjective  Chief Complaint  Follow Up  HPI  Angina .He had MI on 02/2019 it was  NSTEMI  he had a cardiac cath but stent was not placed, he is under the care of Dr. Nehemiah Massed, he tried Atorvastatin, Crestor and Pravastatin most recently Mevacor but develops sever myalgia.We gave him Zetia but he also stopped - it caused a headache. He is compliant with Plavix, beta blocker . He had another episode of angina after a long day at work. He was sitting on his sofa and developed substernal chest pain that radiated to both arms, it resolved after 2nd dose of NTG  Chronic bronchitis: he smoked for over 30 years, quit again 02/07/2020, he still has a productive cough in am's, he has trelegy at home but not very complaint, he has sob with activity and intermittent wheezing    GERD: he states symptoms controlled with PPI    OSA:  he has CPAP at home    Migraine/headaches: he has dull ache/tension type headache dull frontal headache at least once a week, he takes Toradol prn for that, last rx lasted over 6 months . He states migraine is more intense, associated with photophobia, phonophobia, nausea and vomiting, sometimes dizziness. He needs refills of his medications today    RA:  off Enbrel because of cost but is  on Plaquenil given by Dr. Jefm Bryant.  He is also coming off prednisone. He has pain on top of his feet and some swelling , hand swelling and pain has improved.    HTN:   he has been taking medication and bp has been at goal.    History of DM  diagnosed Nov 2016, but has been below 6.4 % for many years without medications.  He states while on prednisone his glucose spiked to 300 and has been above 120 since. He denies polyphagia but he has noticed some polydipsia. A1C today still 6.4 % and glucose is improving at home.    Hyperlipidemia: he was  on Lipitor since 02/2019 after MI , however  stopped because of muscle aches, explained importance of statin therapy , we tried Pravastatin but also unable tolerate it , unable to tolerate Zetia, we gave him Repatha but he missed the call from the company. Advised him to try to find it so he can get medication for free   Atherosclerosis of aorta: on plavix but off statin therapy and not on Zetia due to headache   Abdominal pain: he noticed right side back pain, followed by flank pain and now intermittent pain on RLQ, no dysuria or hematuria, he has a long history of urinary incontinence and had one episode of nocturnal enuresis a few months ago after he took promethazine and went to bed. He is off Myrbetriq due to cost. Takes Flomax. Discussed checking urine culture but he would like to hold off .States symptoms back to baseline. 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[place]) 01/05/2019   Atherosclerosis of aorta ([place]) 06/09/2018   Mediastinal lymphadenopathy 06/09/2018   Lung nodules 06/09/2018   Arthralgia of  left temporomandibular joint 12/08/2017   Mucopurulent chronic bronchitis ([place]) 12/08/2017   Mild left ventricular systolic dysfunction 86/76/7209   Grade II diastolic dysfunction 47/10/6281   Mild mitral regurgitation 07/05/2017   Mild tricuspid regurgitation 07/05/2017   History of left hip replacement 12/27/2016   Angina pectoris ([place]) 10/27/2016   Carpal tunnel syndrome on both sides 04/07/2016   Primary osteoarthritis involving multiple joints 07/29/2015   Arthritis of knee, degenerative 02/03/2015   Benign prostatic hyperplasia 09/28/2014   Chronic obstructive pulmonary disease ([place]) 09/28/2014   Drug abuse  ([place]) 09/28/2014   Deflected nasal septum 09/28/2014   Dyslipidemia 66/29/4765   Dysmetabolic syndrome 46/50/3546   GERD (gastroesophageal reflux disease) 09/28/2014   H/O renal calculi 09/28/2014   HTN (hypertension) 09/28/2014   Male hypogonadism 09/28/2014   Failure of erection 09/28/2014   Obstructive sleep apnea syndrome 09/28/2014   Depression with anxiety 09/28/2014   Obesity (BMI 30-39.9) 09/28/2014   Migraine without aura and responsive to treatment 09/28/2014   Perennial allergic rhinitis with seasonal variation 09/28/2014   Elevated platelet count 09/28/2014   Tobacco abuse 09/28/2014   Cerebrovascular accident (CVA) ([place]) 07/27/2013   History of TIA (transient ischemic attack) 09/20/2011   Rheumatoid arthritis involving multiple joints ([place]) 07/31/2009    Past Surgical History:  Procedure Laterality Date   CORONARY BALLOON ANGIOPLASTY N/A 03/14/2019   Procedure: CORONARY BALLOON ANGIOPLASTY;  Surgeon: Yolonda Kida, MD;  Location: [facility];  Service: Cardiovascular;  Laterality: N/A;   FACIAL RECONSTRUCTION SURGERY     JOINT REPLACEMENT Left    hip   JOINT REPLACEMENT Right    knee   KNEE SURGERY Right    LEFT HEART CATH AND CORONARY ANGIOGRAPHY N/A 03/14/2019   Procedure: LEFT HEART CATH AND CORONARY ANGIOGRAPHY;  Surgeon: Corey Skains, MD;  Location: [facility];  Service: Cardiovascular;  Laterality: N/A;    Family History  Problem Relation Age of Onset   Diabetes Mother    Diabetes Father    Heart attack Father    Cancer Brother        Skin and Prostate-Oldest Brother   Diabetes Brother     Social History   Tobacco Use   Smoking status: Former    Packs/day: 1.00    Years: 30.00    Pack years: 30.00    Types: Cigarettes    Start date: 09/30/1976    Quit date: 02/07/2020    Years since quitting: 0.9   Smokeless tobacco: Never   Tobacco comments:    he quit   Substance Use Topics   Alcohol use: Yes    Alcohol/week:  4.0 standard drinks    Types: 4 Cans of beer per week    Comment: daily     Current Outpatient Medications:    albuterol (VENTOLIN HFA) 108 (90 Base) MCG/ACT inhaler, Inhale 2 puffs into the lungs every 6 (six) hours as needed for wheezing or shortness of breath., Disp: 8 g, Rfl: 0   amLODipine-benazepril (LOTREL) 10-40 MG capsule, Take 1 capsule by mouth daily., Disp: , Rfl:    aspirin EC 81 MG tablet, Take 81 mg by mouth. Reported on 07/29/2015, Disp: , Rfl:    carvedilol (COREG) 25 MG tablet, TAKE ONE TABLET BY MOUTH TWICE A DAY WITH A MEAL, Disp: 180 tablet, Rfl: 0   clopidogrel (PLAVIX) 75 MG tablet, Take 1 tablet (75 mg total) by mouth daily., Disp: 90 tablet, Rfl: 0   ezetimibe (ZETIA) 10 MG tablet,  Take 1 tablet by mouth daily., Disp: , Rfl:    Fluticasone-Umeclidin-Vilant (TRELEGY ELLIPTA IN), Inhale 1 puff into the lungs daily., Disp: , Rfl:    hydrocortisone (ANUSOL-HC) 2.5 % rectal cream, Place 1 application rectally 2 (two) times daily., Disp: 30 g, Rfl: 0   hydroxychloroquine (PLAQUENIL) 200 MG tablet, Take 400 tablets by mouth at bedtime. , Disp: , Rfl:    ketorolac (TORADOL) 10 MG tablet, Take 1 tablet (10 mg total) by mouth every 6 (six) hours as needed., Disp: 30 tablet, Rfl: 1   methylPREDNISolone (MEDROL DOSEPAK) 4 MG TBPK tablet, Day 1: Take 8 mg (2 tablets) before breakfast, 4 mg (1 tablet) after lunch, 4 mg (1 tablet) after supper, and 8 mg (2 tablets) at bedtime. Day 2:Take 4 mg (1 tablet) before breakfast, 4 mg (1 tablet) after lunch, 4 mg (1 tablet) after supper, and 8 mg (2 tablets) at bedtime. Day 3: Take 4 mg (1 tablet) before breakfast, 4 mg (1 tablet) after lunch, 4 mg (1 tablet) after supper, and 4 mg (1 tablet) at bedtime. Day 4: Take 4 mg (1 tablet) before breakfast, 4 mg (1 tablet) after lunch, and 4 mg (1 tablet) at bedtime. Day 5: Take 4 mg (1 tablet) before breakfast and 4 mg (1 tablet) at bedtime. Day 6: Take 4 mg (1 tablet) before breakfast., Disp: 1 each, Rfl:  0   mirabegron ER (MYRBETRIQ) 25 MG TB24 tablet, Take 1 tablet (25 mg total) by mouth daily., Disp: 28 tablet, Rfl: 0   nitroGLYCERIN (NITROSTAT) 0.4 MG SL tablet, Place 1 tablet (0.4 mg total) under the tongue every 5 (five) minutes x 3 doses as needed for chest pain., Disp: 100 tablet, Rfl: 0   pantoprazole (PROTONIX) 20 MG tablet, Take 1 tablet (20 mg total) by mouth daily., Disp: 90 tablet, Rfl: 1   promethazine (PHENERGAN) 12.5 MG tablet, Take 1 tablet (12.5 mg total) by mouth every 8 (eight) hours as needed for nausea or vomiting., Disp: 20 tablet, Rfl: 0   rizatriptan (MAXALT) 10 MG tablet, Take 1 tablet (10 mg total) by mouth as needed for migraine., Disp: 10 tablet, Rfl: 0   sildenafil (REVATIO) 20 MG tablet, TAKE 3-5 TABLET BY MOUTH TWO HOURS BEFORE INTERCOURSE ON AN EMPTY STOMACH. DO NOT TAKE WITH NITRATES, Disp: 30 tablet, Rfl: 0   tamsulosin (FLOMAX) 0.4 MG CAPS capsule, Take 1 capsule (0.4 mg total) by mouth daily., Disp: 90 capsule, Rfl: 3   Evolocumab (REPATHA SURECLICK) 932 MG/ML SOAJ, Inject 140 mg into the skin every 14 (fourteen) days. (Patient not taking: Reported on 01/26/2021), Disp: , Rfl:   Allergies  Allergen Reactions   Ciprofloxacin Other (See Comments)    Body aches   Crestor [Rosuvastatin Calcium] Other (See Comments)    Muscles aches   Droperidol Other (See Comments)    Heart stopped   Lipofen  [Fenofibrate]     muscle aches muscle aches   Niacin Er     flushed   Pravastatin     Headache     I personally reviewed active problem list, medication list, allergies, family history, social history, health maintenance with the patient/caregiver today.   ROS  Ten systems reviewed and is negative except as mentioned in HPI   Objective  Vitals:   01/26/21 1058  BP: 120/78  Pulse: 91  Resp: 16  Temp: 98 F (36.7 C)  SpO2: 97%  Weight: 220 lb (99.8 kg)  Height: 5\' 7"  (1.702 m)  Body mass index is 34.46 kg/m.  Physical Exam  Constitutional:  Patient appears well-developed and well-nourished. Obese  No distress.  HEENT: head atraumatic, normocephalic, pupils equal and reactive to light,neck supple Cardiovascular: Normal rate, regular rhythm and normal heart sounds.  No murmur heard. No BLE edema. Puffy on dorsal feet  Pulmonary/Chest: Effort normal and breath sounds normal. No respiratory distress. Abdominal: Soft.  There is tenderness on lower quadrants bilaterally, no guarding or rebound , negative CVA tenderness  Psychiatric: Patient has a normal mood and affect. behavior is normal. Judgment and thought content normal.   PHQ2/9: Depression screen [facility] 2/9 01/26/2021 11/27/2020 11/26/2020 04/11/2020 10/10/2019  Decreased Interest 0 0 0 0 0  Down, Depressed, Hopeless 0 0 0 0 0  PHQ - 2 Score 0 0 0 0 0  Altered sleeping 0 0 - - 1  Tired, decreased energy 0 0 - - 2  Change in appetite 0 0 - - 2  Feeling bad or failure about yourself  0 0 - - 1  Trouble concentrating 0 0 - - 0  Moving slowly or fidgety/restless 0 0 - - 0  Suicidal thoughts 0 0 - - 0  PHQ-9 Score 0 0 - - 6  Difficult doing work/chores - Not difficult at all - - Somewhat difficult  Some recent data might be hidden    phq 9 is negative   Fall Risk: Fall Risk  01/26/2021 11/27/2020 11/26/2020 04/11/2020 10/10/2019  Falls in the past year? 1 1 0 1 1  Number falls in past yr: 1 1 0 1 1  Comment - - - 3 -  Injury with Fall? 0 0 0 1 0  Risk for fall due to : Impaired mobility;Impaired balance/gait Impaired mobility;Impaired balance/gait - - -  Follow up Falls prevention discussed - Falls evaluation completed - -      Functional Status Survey: Is the patient deaf or have difficulty hearing?: No Does the patient have difficulty seeing, even when wearing glasses/contacts?: No Does the patient have difficulty concentrating, remembering, or making decisions?: No Does the patient have difficulty walking or climbing stairs?: Yes Does the patient have difficulty  dressing or bathing?: Yes Does the patient have difficulty doing errands alone such as visiting a doctor's office or shopping?: No    Assessment & Plan  1. Hyperglycemia  - POCT HgB A1C  2. Rheumatoid arthritis involving multiple joints (HCC)  Keep follow up with Dr. Jefm Bryant   3. Angina pectoris ([place])   4. Atherosclerosis of aorta [facility])  He needs to contact pharmacy   5. Obesity (BMI 30.0-34.9)   6. Essential hypertension   7. Gastroesophageal reflux disease without esophagitis  - pantoprazole (PROTONIX) 20 MG tablet; Take 1 tablet (20 mg total) by mouth daily.  Dispense: 90 tablet; Refill: 1  8. History of MI (myocardial infarction)   9. Migraine without aura and responsive to treatment  - ketorolac (TORADOL) 10 MG tablet; Take 1 tablet (10 mg total) by mouth every 6 (six) hours as needed.  Dispense: 30 tablet; Refill: 1 - rizatriptan (MAXALT) 10 MG tablet; Take 1 tablet (10 mg total) by mouth as needed for migraine.  Dispense: 10 tablet; Refill: 0 - promethazine (PHENERGAN) 12.5 MG tablet; Take 1 tablet (12.5 mg total) by mouth every 8 (eight) hours as needed for nausea or vomiting.  Dispense: 20 tablet; Refill: 0  10. Statin myopathy   11. Other male erectile dysfunction  - sildenafil (VIAGRA) 100 MG tablet; Take 0.5-1 tablets (  50-100 mg total) by mouth daily as needed for erectile dysfunction.  Dispense: 30 tablet; Refill: 0  12. Lower abdominal pain   Seems muscular

## 2021-01-26 ENCOUNTER — Ambulatory Visit: Payer: Self-pay | Admitting: Family Medicine

## 2021-01-26 ENCOUNTER — Other Ambulatory Visit: Payer: Self-pay

## 2021-01-26 ENCOUNTER — Encounter: Payer: Self-pay | Admitting: Family Medicine

## 2021-01-26 VITALS — BP 120/78 | HR 91 | Temp 98.0°F | Resp 16 | Ht 67.0 in | Wt 220.0 lb

## 2021-01-26 DIAGNOSIS — E669 Obesity, unspecified: Secondary | ICD-10-CM

## 2021-01-26 DIAGNOSIS — M069 Rheumatoid arthritis, unspecified: Secondary | ICD-10-CM

## 2021-01-26 DIAGNOSIS — I252 Old myocardial infarction: Secondary | ICD-10-CM

## 2021-01-26 DIAGNOSIS — G720 Drug-induced myopathy: Secondary | ICD-10-CM

## 2021-01-26 DIAGNOSIS — R1030 Lower abdominal pain, unspecified: Secondary | ICD-10-CM

## 2021-01-26 DIAGNOSIS — G43009 Migraine without aura, not intractable, without status migrainosus: Secondary | ICD-10-CM

## 2021-01-26 DIAGNOSIS — K219 Gastro-esophageal reflux disease without esophagitis: Secondary | ICD-10-CM

## 2021-01-26 DIAGNOSIS — I10 Essential (primary) hypertension: Secondary | ICD-10-CM

## 2021-01-26 DIAGNOSIS — I700 Atherosclerosis of aorta: Secondary | ICD-10-CM

## 2021-01-26 DIAGNOSIS — E782 Mixed hyperlipidemia: Secondary | ICD-10-CM | POA: Insufficient documentation

## 2021-01-26 DIAGNOSIS — I209 Angina pectoris, unspecified: Secondary | ICD-10-CM

## 2021-01-26 DIAGNOSIS — T466X5A Adverse effect of antihyperlipidemic and antiarteriosclerotic drugs, initial encounter: Secondary | ICD-10-CM

## 2021-01-26 DIAGNOSIS — R739 Hyperglycemia, unspecified: Secondary | ICD-10-CM

## 2021-01-26 DIAGNOSIS — N528 Other male erectile dysfunction: Secondary | ICD-10-CM

## 2021-01-26 LAB — POCT GLYCOSYLATED HEMOGLOBIN (HGB A1C): Hemoglobin A1C: 6.4 % — AB (ref 4.0–5.6)

## 2021-01-26 MED ORDER — PROMETHAZINE HCL 12.5 MG PO TABS: 12.5000 mg | ORAL_TABLET | Freq: Three times a day (TID) | ORAL | 0 refills | Status: AC | PRN

## 2021-01-26 MED ORDER — PANTOPRAZOLE SODIUM 20 MG PO TBEC: 20.0000 mg | DELAYED_RELEASE_TABLET | Freq: Every day | ORAL | 1 refills | Status: AC

## 2021-01-26 MED ORDER — KETOROLAC TROMETHAMINE 10 MG PO TABS: 10.0000 mg | ORAL_TABLET | Freq: Four times a day (QID) | ORAL | 1 refills | Status: AC | PRN

## 2021-01-26 MED ORDER — SILDENAFIL CITRATE 100 MG PO TABS: 50.0000 mg | ORAL_TABLET | Freq: Every day | ORAL | 0 refills | Status: AC | PRN

## 2021-01-26 MED ORDER — RIZATRIPTAN BENZOATE 10 MG PO TABS: 10.0000 mg | ORAL_TABLET | ORAL | 0 refills | Status: AC | PRN

## 2021-02-18 ENCOUNTER — Ambulatory Visit: Payer: Self-pay | Admitting: *Deleted

## 2021-02-18 NOTE — Telephone Encounter (Signed)
Spoke to pt asked if he went to UC, pt states he did and he got a ointment rx. I advised if not gets better to call our office to get an appointment for follow up and until then we will see about a referral for Dermatology.

## 2021-02-18 NOTE — Telephone Encounter (Signed)
Pt has a rash that started yesterday on his legs, back and belly / Pt stated that the rash is red, stings and itches/  pt has a phone appt tomorrow afternoon at 1pm and wants to know if he needs to go to a dermatologist / please advise    Chief Complaint: Rash Symptoms: Itchy rash, stinging, spreading. Some areas moist, pimple like with white head, some places splotchy.  Frequency: onset 2 days ago, back of legs, has spread "Everywhere except face." Pertinent Negatives: Patient denies sob, fever,pain,swallowing difficulty.  Disposition: [] ED /[x] Urgent Care (no appt availability in office) / [] Appointment(In office/virtual)/ []  [facility]/ [] Home Care/ [] Refused Recommended Disposition /[] [place] [facility]/ []  Follow-up with PCP Additional Notes: No new cosmetics, detergents, no new meds. Was taking Tumeric but stopped. Rash spreading, Benadryl ineffective. Advised UC. States he was planning on going anyways.    Reason for Disposition  Large or small blisters on skin (i.e., fluid filled bubbles or sacs)  Answer Assessment - Initial Assessment Questions 1. APPEARANCE of RASH: "Describe the rash." (e.g., spots, blisters, raised areas, skin peeling, scaly)     "Like little pimples with a white head." 2. SIZE: "How big are the spots?" (e.g., tip of pen, eraser, coin; inches, centimeters)     Some on legs, pencil eraser size "Blotchy" 3. LOCATION: "Where is the rash located?"     Legs, back of arms, stomach, waist, groin 4. COLOR: "What color is the rash?" (Note: It is difficult to assess rash color in people with darker-colored skin. When this situation occurs, simply ask the caller to describe what they see.)     Red 5. ONSET: "When did the rash begin?"      2 days ago, worsened 6. FEVER: "Do you have a fever?" If Yes, ask: "What is your temperature, how was it measured, and when did it start?"     no 7. ITCHING: "Does the rash itch?" If Yes, ask: "How bad is the itch?"  (Scale 1-10; or mild, moderate, severe)     Itchy, stinging.  3-4/10 8. CAUSE: "What do you think is causing the rash?"     Taking Tumeric."  9. MEDICINE FACTORS: "Have you started any new medicines within the last 2 weeks?" (e.g., antibiotics)      no 10. OTHER SYMPTOMS: "Do you have any other symptoms?" (e.g., dizziness, headache, sore throat, joint pain)       Joint pain from RA.  On prednisone started yesterday but the rash already present  Protocols used: Rash or Redness - [facility]

## 2021-02-19 ENCOUNTER — Telehealth: Payer: Self-pay | Admitting: Nurse Practitioner

## 2021-02-20 ENCOUNTER — Ambulatory Visit: Payer: Self-pay | Admitting: *Deleted

## 2021-02-20 NOTE — Telephone Encounter (Signed)
Pt called saying he is having muscle pain all over.  He has RA .  He seen Dr. Jefm Bryant and gave him prednisone . HE has also had a bad rash which a walk in gave him a cream.  Now this am he has woke up aching all over.   [facility] is booked out .   CB#  [phone]     Chief Complaint:  Muscle spasms Symptoms: Pain in legs and arms 7-8/10, also has rash, seen in UC 02/18/21, LGT Frequency: onset last night Pertinent Negatives: Patient denies  Disposition: [] ED /[] Urgent Care (no appt availability in office) / [] Appointment(In office/virtual)/ []  [place] [facility]/ [] Home Care/ [] Refused Recommended Disposition /[]  Mobile Bus/ []  Follow-up with PCP Additional Notes: H/O RA. Pt will alert Dr. Jefm Bryant of symptoms. Reason for Disposition  [1] SEVERE pain (e.g., excruciating, unable to do any normal activities) AND [2] not improved 2 hours after pain medicine  Answer Assessment - Initial Assessment Questions 1. ONSET: "When did the muscle aches or body pains start?"      Last night 2. LOCATION: "What part of your body is hurting?" (e.g., entire body, arms, legs)      Arms and legs, "Biceps and calf muscles" 3. SEVERITY: "How bad is the pain?" (Scale 1-10; or mild, moderate, severe)   - MILD (1-3): doesn't interfere with normal activities    - MODERATE (4-7): interferes with normal activities or awakens from sleep    - SEVERE (8-10):  excruciating pain, unable to do any normal activities      7/10 4. CAUSE: "What do you think is causing the pains?"     Unsure 5. FEVER: "Have you been having fever?"     Last night 99.2 6. OTHER SYMPTOMS: "Do you have any other symptoms?" (e.g., chest pain, weakness, rash, cold or flu symptoms, weight loss)     Rash, seen in UC 02/18/21.  Protocols used: Muscle Aches and Body Pain-A-AH

## 2021-03-12 ENCOUNTER — Ambulatory Visit: Payer: Self-pay | Admitting: *Deleted

## 2021-03-12 ENCOUNTER — Telehealth: Payer: Self-pay | Admitting: Family Medicine

## 2021-03-12 NOTE — Telephone Encounter (Signed)
Would you want him to get seen in [facility]?

## 2021-03-12 NOTE — Telephone Encounter (Signed)
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 LOCATION: "Where is the ulcer located?"      On the tongue and jaw 2. NUMBER: "How many ulcers are there?"      10 3. SIZE: "How large is the ulcer?"      small 4. SEVERITY: "Are they painful?" If Yes, ask: "How bad is it?"  (Scale 1-10; or mild, moderate, severe)  - MILD - eating  and drinking normally   - MODERATE - decreased liquid intake   - SEVERE - drinking very little      Yes- moderate/severe 5. ONSET: "When did you first notice the ulcer?"      3 days 6. RECURRENT SYMPTOM: "Have you had a mouth ulcer before?" If Yes, ask: "When was the last time?" and "What happened that time?"      Yes- has been treated- 1-2 months ago- does not remember medication given 7. CAUSE: "What do you think is causing the mouth ulcer?"     Patient thinks it is from his inhaler 8. OTHER SYMPTOMS: "Do you have any other symptoms?" (e.g., fever)     *No Answer* 9. PREGNANCY: "Is there any chance you are pregnant?" "When was your last menstrual period?"     *No Answer*  Protocols used: Mouth Ulcers-A-AH

## 2021-03-12 NOTE — Telephone Encounter (Signed)
°  Chief Complaint: medication request Symptoms: mouth ulcers- multiple Frequency: 3 days Pertinent Negatives: Patient denies fever Disposition: [] ED /[] Urgent Care (no appt availability in office) / [] Appointment(In office/virtual)/ []  [place] [facility]/ [] Home Care/ [x] Refused Recommended Disposition /[] [place] [facility]/ []  Follow-up with PCP Additional Notes: Medication request- patient states PCP has treated him with medication for this 1-2 months ago.

## 2021-03-12 NOTE — Telephone Encounter (Signed)
Pt calling back to follow up with nurse regarding medication request for sore tongue.

## 2021-03-13 ENCOUNTER — Other Ambulatory Visit: Payer: Self-pay | Admitting: Family Medicine

## 2021-03-13 ENCOUNTER — Encounter: Payer: Self-pay | Admitting: *Deleted

## 2021-03-13 MED ORDER — NYSTATIN 100000 UNIT/ML MT SUSP: 5.0000 mL | Freq: Four times a day (QID) | OROMUCOSAL | 0 refills | Status: DC

## 2021-03-13 NOTE — Telephone Encounter (Signed)
This encounter was created in error - please disregard.

## 2021-03-13 NOTE — Telephone Encounter (Signed)
Patient called back requesting medication to help with "white spots" on sides of tongue. Paitent reports mouth dry and tried to scrub with toothbrush and made area on tongue split. Appt scheduled for 03/17/21. Patient requesting doxycyline or more magic mouth wash. Recommended UC due to after 5 pm.

## 2021-03-13 NOTE — Telephone Encounter (Signed)
Pt. Calling again to request medication "for the thrush in my mouth." Instructed Dr. Ancil Boozer will contact him as soon as she reviews request. Verbalizes understanding.

## 2021-03-15 ENCOUNTER — Other Ambulatory Visit: Payer: Self-pay | Admitting: Family Medicine

## 2021-03-15 DIAGNOSIS — I252 Old myocardial infarction: Secondary | ICD-10-CM

## 2021-03-15 DIAGNOSIS — I10 Essential (primary) hypertension: Secondary | ICD-10-CM

## 2021-03-16 NOTE — Progress Notes (Deleted)
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[place]) 01/05/2019   Atherosclerosis of aorta ([place]) 06/09/2018   Mediastinal lymphadenopathy 06/09/2018   Lung nodules 06/09/2018   Arthralgia of left temporomandibular joint 12/08/2017   Mucopurulent chronic bronchitis ([place]) 12/08/2017   Mild left ventricular systolic dysfunction 44/62/8638   Grade II diastolic dysfunction 17/71/1657   Mild mitral regurgitation 07/05/2017   Mild tricuspid regurgitation 07/05/2017   History of left hip replacement 12/27/2016   Angina pectoris ([place]) 10/27/2016   Carpal tunnel syndrome on both sides 04/07/2016   Primary osteoarthritis involving multiple joints 07/29/2015   Arthritis of knee, degenerative 02/03/2015   Benign prostatic hyperplasia 09/28/2014   Chronic obstructive pulmonary disease ([place]) 09/28/2014   Drug abuse ([place]) 09/28/2014   Deflected nasal septum 09/28/2014   Dyslipidemia 90/38/3338   Dysmetabolic syndrome 32/91/9166   GERD (gastroesophageal reflux disease) 09/28/2014   H/O renal calculi 09/28/2014   HTN (hypertension) 09/28/2014   Male hypogonadism 09/28/2014   Failure of erection 09/28/2014   Obstructive sleep apnea syndrome 09/28/2014   Depression with anxiety 09/28/2014   Obesity (BMI 30-39.9) 09/28/2014   Migraine without aura and responsive to treatment 09/28/2014   Perennial allergic rhinitis with seasonal variation 09/28/2014   Elevated platelet count 09/28/2014    Tobacco abuse 09/28/2014   History of TIA (transient ischemic attack) 09/20/2011   Rheumatoid arthritis involving multiple joints ([place]) 07/31/2009    Past Surgical History:  Procedure Laterality Date   CORONARY BALLOON ANGIOPLASTY N/A 03/14/2019   Procedure: CORONARY BALLOON ANGIOPLASTY;  Surgeon: Yolonda Kida, MD;  Location: [facility];  Service: Cardiovascular;  Laterality: N/A;   FACIAL RECONSTRUCTION SURGERY     JOINT REPLACEMENT Left    hip   JOINT REPLACEMENT Right    knee   KNEE SURGERY Right    LEFT HEART CATH AND CORONARY ANGIOGRAPHY N/A 03/14/2019   Procedure: LEFT HEART CATH AND CORONARY ANGIOGRAPHY;  Surgeon: Corey Skains, MD;  Location: [facility];  Service: Cardiovascular;  Laterality: N/A;    Family History  Problem Relation Age of Onset   Diabetes Mother    Diabetes Father    Heart attack Father    Cancer Brother        Skin and Prostate-Oldest Brother   Diabetes Brother     Social History   Tobacco Use   Smoking status: Former    Packs/day: 1.00    Years: 30.00    Pack years: 30.00    Types: Cigarettes    Start date: 09/30/1976    Quit date: 02/07/2020    Years since quitting: 1.1   Smokeless tobacco: Never   Tobacco comments:    he quit   Substance Use Topics   Alcohol use: Yes    Alcohol/week: 4.0 standard drinks    Types: 4 Cans of beer per week    Comment: daily     Current Outpatient Medications:    albuterol (VENTOLIN HFA) 108 (90  Base) MCG/ACT inhaler, Inhale 2 puffs into the lungs every 6 (six) hours as needed for wheezing or shortness of breath., Disp: 8 g, Rfl: 0   amLODipine-benazepril (LOTREL) 10-40 MG capsule, Take 1 capsule by mouth daily., Disp: , Rfl:    aspirin EC 81 MG tablet, Take 81 mg by mouth. Reported on 07/29/2015, Disp: , Rfl:    carvedilol (COREG) 25 MG tablet, TAKE ONE TABLET BY MOUTH TWICE A DAY WITH A MEAL, Disp: 180 tablet, Rfl: 0   Evolocumab (REPATHA SURECLICK) 882 MG/ML SOAJ, Inject  140 mg into the skin every 14 (fourteen) days. (Patient not taking: Reported on 01/26/2021), Disp: , Rfl:    Fluticasone-Umeclidin-Vilant (TRELEGY ELLIPTA IN), Inhale 1 puff into the lungs daily., Disp: , Rfl:    hydrocortisone (ANUSOL-HC) 2.5 % rectal cream, Place 1 application rectally 2 (two) times daily., Disp: 30 g, Rfl: 0   hydroxychloroquine (PLAQUENIL) 200 MG tablet, Take 400 tablets by mouth at bedtime. , Disp: , Rfl:    ketorolac (TORADOL) 10 MG tablet, Take 1 tablet (10 mg total) by mouth every 6 (six) hours as needed., Disp: 30 tablet, Rfl: 1   nitroGLYCERIN (NITROSTAT) 0.4 MG SL tablet, Place 1 tablet (0.4 mg total) under the tongue every 5 (five) minutes x 3 doses as needed for chest pain., Disp: 100 tablet, Rfl: 0   nystatin (MYCOSTATIN) 100000 UNIT/ML suspension, Take 5 mLs (500,000 Units total) by mouth 4 (four) times daily., Disp: 60 mL, Rfl: 0   pantoprazole (PROTONIX) 20 MG tablet, Take 1 tablet (20 mg total) by mouth daily., Disp: 90 tablet, Rfl: 1   promethazine (PHENERGAN) 12.5 MG tablet, Take 1 tablet (12.5 mg total) by mouth every 8 (eight) hours as needed for nausea or vomiting., Disp: 20 tablet, Rfl: 0   rizatriptan (MAXALT) 10 MG tablet, Take 1 tablet (10 mg total) by mouth as needed for migraine., Disp: 10 tablet, Rfl: 0   sildenafil (VIAGRA) 100 MG tablet, Take 0.5-1 tablets (50-100 mg total) by mouth daily as needed for erectile dysfunction., Disp: 30 tablet, Rfl: 0   tamsulosin (FLOMAX) 0.4 MG CAPS capsule, Take 1 capsule (0.4 mg total) by mouth daily., Disp: 90 capsule, Rfl: 3  Allergies  Allergen Reactions   Ciprofloxacin Other (See Comments)    Body aches   Crestor [Rosuvastatin Calcium] Other (See Comments)    Muscles aches   Droperidol Other (See Comments)    Heart stopped   Lipofen  [Fenofibrate]     muscle aches muscle aches   Niacin Er     flushed   Pravastatin     Headache    Zetia [Ezetimibe]     Headaches     I personally reviewed active  problem list, medication list, allergies, family history, social history, health maintenance with the patient/caregiver today.   ROS  ***  Objective  There were no vitals filed for this visit.  There is no height or weight on file to calculate BMI.  Physical Exam ***  Recent Results (from the past 2160 hour(s))  POCT HgB A1C     Status: Abnormal   Collection Time: 01/26/21 12:11 PM  Result Value Ref Range   Hemoglobin A1C 6.4 (A) 4.0 - 5.6 %   HbA1c POC (<> result, manual entry)     HbA1c, POC (prediabetic range)     HbA1c, POC (controlled diabetic range)       PHQ2/9: Depression screen [facility] 2/9 01/26/2021 11/27/2020 11/26/2020 04/11/2020 10/10/2019  Decreased Interest 0 0  0 0 0  Down, Depressed, Hopeless 0 0 0 0 0  PHQ - 2 Score 0 0 0 0 0  Altered sleeping 0 0 - - 1  Tired, decreased energy 0 0 - - 2  Change in appetite 0 0 - - 2  Feeling bad or failure about yourself  0 0 - - 1  Trouble concentrating 0 0 - - 0  Moving slowly or fidgety/restless 0 0 - - 0  Suicidal thoughts 0 0 - - 0  PHQ-9 Score 0 0 - - 6  Difficult doing work/chores - Not difficult at all - - Somewhat difficult  Some recent data might be hidden    phq 9 is {gen pos [id number]}   Fall Risk: Fall Risk  01/26/2021 11/27/2020 11/26/2020 04/11/2020 10/10/2019  Falls in the past year? 1 1 0 1 1  Number falls in past yr: 1 1 0 1 1  Comment - - - 3 -  Injury with Fall? 0 0 0 1 0  Risk for fall due to : Impaired mobility;Impaired balance/gait Impaired mobility;Impaired balance/gait - - -  Follow up Falls prevention discussed - Falls evaluation completed - -      Functional Status Survey:      Assessment & Plan  *** There are no diagnoses linked to this encounter.

## 2021-03-16 NOTE — Telephone Encounter (Signed)
No answer from pt left vm rx was sent over to pharmacy.

## 2021-03-17 ENCOUNTER — Ambulatory Visit: Payer: Self-pay | Admitting: Family Medicine

## 2021-03-18 ENCOUNTER — Ambulatory Visit: Payer: Self-pay | Admitting: Internal Medicine

## 2021-03-26 ENCOUNTER — Ambulatory Visit: Payer: Self-pay | Admitting: Family Medicine

## 2021-03-26 NOTE — Progress Notes (Signed)
Name: Tim Anderson   MRN: [id number]    DOB: [birth year]   Date:03/27/2021       Progress Note  Subjective  Chief Complaint  White Bump on Tongue  HPI  Patient states he has not been feeling well for the past few weeks, he is having severe lower extremity edema, orthopnea, abdomen is distended and tender , he is extremely fatigued. He has a history of NSTEMI, RA and recently diagnosed with leukocytoclastic vasculitis . He has been driving and going to work but feels very tired . He has noticed a dry cough , feels like he is drowning when laying flat. No nausea, vomiting or change in bowel movement. He feels hot but no fever or chills.    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[place]) 01/05/2019   Atherosclerosis of aorta ([place]) 06/09/2018   Mediastinal lymphadenopathy 06/09/2018   Lung nodules 06/09/2018   Arthralgia of left temporomandibular joint 12/08/2017   Mucopurulent chronic bronchitis ([place]) 12/08/2017   Mild left ventricular systolic dysfunction 37/62/8315   Grade II diastolic dysfunction 17/61/6073   Mild mitral regurgitation 07/05/2017   Mild tricuspid regurgitation 07/05/2017   History of left hip replacement 12/27/2016   Angina pectoris ([place]) 10/27/2016   Carpal tunnel syndrome on both sides 04/07/2016   Primary osteoarthritis involving multiple joints 07/29/2015   Arthritis of knee, degenerative 02/03/2015   Benign prostatic hyperplasia 09/28/2014   Chronic obstructive pulmonary disease ([place]) 09/28/2014   Drug abuse ([place]) 09/28/2014   Deflected nasal septum 09/28/2014   Dyslipidemia 71/07/2692   Dysmetabolic  syndrome 85/46/2703   GERD (gastroesophageal reflux disease) 09/28/2014   H/O renal calculi 09/28/2014   HTN (hypertension) 09/28/2014   Male hypogonadism 09/28/2014   Failure of erection 09/28/2014   Obstructive sleep apnea syndrome 09/28/2014   Depression with anxiety 09/28/2014   Obesity (BMI 30-39.9) 09/28/2014   Migraine without aura and responsive to treatment 09/28/2014   Perennial allergic rhinitis with seasonal variation 09/28/2014   Elevated platelet count 09/28/2014   Tobacco abuse 09/28/2014   History of TIA (transient ischemic attack) 09/20/2011   Rheumatoid arthritis involving multiple joints ([place]) 07/31/2009    Past Surgical History:  Procedure Laterality Date   CORONARY BALLOON ANGIOPLASTY N/A 03/14/2019   Procedure: CORONARY BALLOON ANGIOPLASTY;  Surgeon: Yolonda Kida, MD;  Location: [facility];  Service: Cardiovascular;  Laterality: N/A;   FACIAL RECONSTRUCTION SURGERY     JOINT REPLACEMENT Left    hip   JOINT REPLACEMENT Right    knee   KNEE SURGERY Right    LEFT HEART CATH AND CORONARY ANGIOGRAPHY N/A 03/14/2019   Procedure: LEFT HEART CATH AND CORONARY ANGIOGRAPHY;  Surgeon: Corey Skains, MD;  Location: [facility];  Service: Cardiovascular;  Laterality: N/A;    Family History  Problem Relation Age of Onset   Diabetes Mother    Diabetes Father    Heart attack Father    Cancer Brother        Skin and Prostate-Oldest Brother   Diabetes Brother     Social History   Tobacco Use   Smoking status: Former    Packs/day: 1.00    Years: 30.00  Pack years: 30.00    Types: Cigarettes    Start date: 09/30/1976    Quit date: 02/07/2020    Years since quitting: 1.1   Smokeless tobacco: Never   Tobacco comments:    he quit   Substance Use Topics   Alcohol use: Yes    Alcohol/week: 4.0 standard drinks    Types: 4 Cans of beer per week    Comment: daily     Current Outpatient Medications:    albuterol (VENTOLIN HFA) 108  (90 Base) MCG/ACT inhaler, Inhale 2 puffs into the lungs every 6 (six) hours as needed for wheezing or shortness of breath., Disp: 8 g, Rfl: 0   aspirin EC 81 MG tablet, Take 81 mg by mouth. Reported on 07/29/2015, Disp: , Rfl:    carvedilol (COREG) 25 MG tablet, TAKE ONE TABLET BY MOUTH TWICE A DAY WITH MEALS, Disp: 180 tablet, Rfl: 0   Evolocumab (REPATHA SURECLICK) 244 MG/ML SOAJ, Inject 140 mg into the skin every 14 (fourteen) days., Disp: , Rfl:    Fluticasone-Umeclidin-Vilant (TRELEGY ELLIPTA IN), Inhale 1 puff into the lungs daily., Disp: , Rfl:    hydrocortisone (ANUSOL-HC) 2.5 % rectal cream, Place 1 application rectally 2 (two) times daily., Disp: 30 g, Rfl: 0   hydroxychloroquine (PLAQUENIL) 200 MG tablet, Take 400 tablets by mouth at bedtime. , Disp: , Rfl:    ketorolac (TORADOL) 10 MG tablet, Take 1 tablet (10 mg total) by mouth every 6 (six) hours as needed., Disp: 30 tablet, Rfl: 1   nitroGLYCERIN (NITROSTAT) 0.4 MG SL tablet, Place 1 tablet (0.4 mg total) under the tongue every 5 (five) minutes x 3 doses as needed for chest pain., Disp: 100 tablet, Rfl: 0   nystatin (MYCOSTATIN) 100000 UNIT/ML suspension, Take 5 mLs (500,000 Units total) by mouth 4 (four) times daily., Disp: 60 mL, Rfl: 0   pantoprazole (PROTONIX) 20 MG tablet, Take 1 tablet (20 mg total) by mouth daily., Disp: 90 tablet, Rfl: 1   promethazine (PHENERGAN) 12.5 MG tablet, Take 1 tablet (12.5 mg total) by mouth every 8 (eight) hours as needed for nausea or vomiting., Disp: 20 tablet, Rfl: 0   rizatriptan (MAXALT) 10 MG tablet, Take 1 tablet (10 mg total) by mouth as needed for migraine., Disp: 10 tablet, Rfl: 0   sildenafil (VIAGRA) 100 MG tablet, Take 0.5-1 tablets (50-100 mg total) by mouth daily as needed for erectile dysfunction., Disp: 30 tablet, Rfl: 0   tamsulosin (FLOMAX) 0.4 MG CAPS capsule, Take 1 capsule (0.4 mg total) by mouth daily., Disp: 90 capsule, Rfl: 3   amLODipine-benazepril (LOTREL) 10-40 MG capsule,  Take 1 capsule by mouth daily., Disp: , Rfl:   Allergies  Allergen Reactions   Ciprofloxacin Other (See Comments)    Body aches   Crestor [Rosuvastatin Calcium] Other (See Comments)    Muscles aches   Droperidol Other (See Comments)    Heart stopped   Lipofen  [Fenofibrate]     muscle aches muscle aches   Niacin Er     flushed   Pravastatin     Headache    Zetia [Ezetimibe]     Headaches     I personally reviewed active problem list, medication list, allergies, family history, social history, health maintenance with the patient/caregiver today.   ROS  Ten systems reviewed and is negative except as mentioned in HPI  Objective  Vitals:   03/27/21 0940  BP: 132/84  Pulse: 94  Resp: 16  SpO2: 98%  Weight:  236 lb (107 kg)  Height: 5\' 8"  (1.727 m)    Body mass index is 35.88 kg/m.  Physical Exam  Constitutional: Patient appears well-developed  Obese  No distress.  HEENT: head atraumatic, normocephalic, pupils equal and reactive to light, neck supple Cardiovascular: Normal rate, regular rhythm and normal heart sounds.  No murmur heard. 3 [;is  BLE edema. Pulmonary/Chest: Effort normal and breath sounds normal. No respiratory distress. Abdominal: Soft.  There is no tenderness, he has ascites Psychiatric: Patient has a normal mood and affect. behavior is normal. Judgment and thought content normal.   Recent Results (from the past 2160 hour(s))  POCT HgB A1C     Status: Abnormal   Collection Time: 01/26/21 12:11 PM  Result Value Ref Range   Hemoglobin A1C 6.4 (A) 4.0 - 5.6 %   HbA1c POC (<> result, manual entry)     HbA1c, POC (prediabetic range)     HbA1c, POC (controlled diabetic range)     PHQ2/9: Depression screen [facility] 2/9 03/27/2021 01/26/2021 11/27/2020 11/26/2020 04/11/2020  Decreased Interest 3 0 0 0 0  Down, Depressed, Hopeless 3 0 0 0 0  PHQ - 2 Score 6 0 0 0 0  Altered sleeping 3 0 0 - -  Tired, decreased energy 3 0 0 - -  Change in appetite 0 0 0 -  -  Feeling bad or failure about yourself  0 0 0 - -  Trouble concentrating 0 0 0 - -  Moving slowly or fidgety/restless 0 0 0 - -  Suicidal thoughts 0 0 0 - -  PHQ-9 Score 12 0 0 - -  Difficult doing work/chores - - Not difficult at all - -  Some recent data might be hidden    phq 9 is positive   Fall Risk: Fall Risk  03/27/2021 01/26/2021 11/27/2020 11/26/2020 04/11/2020  Falls in the past year? 0 1 1 0 1  Number falls in past yr: 0 1 1 0 1  Comment - - - - 3  Injury with Fall? 0 0 0 0 1  Risk for fall due to : Impaired balance/gait;No Fall Risks Impaired mobility;Impaired balance/gait Impaired mobility;Impaired balance/gait - -  Follow up Falls prevention discussed Falls prevention discussed - Falls evaluation completed -      Functional Status Survey: Is the patient deaf or have difficulty hearing?: No Does the patient have difficulty seeing, even when wearing glasses/contacts?: No Does the patient have difficulty concentrating, remembering, or making decisions?: No Does the patient have difficulty walking or climbing stairs?: Yes Does the patient have difficulty dressing or bathing?: Yes Does the patient have difficulty doing errands alone such as visiting a doctor's office or shopping?: No    Assessment & Plan  1. Lower extremity edema  He is likely in CHF, explained he needs to go to [facility] right away to prevent Pulmonary Edema. Gave report to [place] at [facility]. He refuses to go by EMS but his wife will drive him there now  2. Orthopnea   3. 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 Shortness of breath   5. Other ascites

## 2021-03-27 ENCOUNTER — Encounter: Payer: Self-pay | Admitting: Family Medicine

## 2021-03-27 ENCOUNTER — Ambulatory Visit (INDEPENDENT_AMBULATORY_CARE_PROVIDER_SITE_OTHER): Payer: 59 | Admitting: Family Medicine

## 2021-03-27 VITALS — BP 132/84 | HR 94 | Resp 16 | Ht 68.0 in | Wt 236.0 lb

## 2021-03-27 DIAGNOSIS — R93429 Abnormal radiologic findings on diagnostic imaging of unspecified kidney: Secondary | ICD-10-CM | POA: Diagnosis not present

## 2021-03-27 DIAGNOSIS — Z20822 Contact with and (suspected) exposure to covid-19: Secondary | ICD-10-CM | POA: Diagnosis not present

## 2021-03-27 DIAGNOSIS — C801 Malignant (primary) neoplasm, unspecified: Secondary | ICD-10-CM | POA: Diagnosis not present

## 2021-03-27 DIAGNOSIS — E785 Hyperlipidemia, unspecified: Secondary | ICD-10-CM | POA: Diagnosis not present

## 2021-03-27 DIAGNOSIS — R079 Chest pain, unspecified: Secondary | ICD-10-CM | POA: Diagnosis not present

## 2021-03-27 DIAGNOSIS — M310 Hypersensitivity angiitis: Secondary | ICD-10-CM

## 2021-03-27 DIAGNOSIS — R609 Edema, unspecified: Secondary | ICD-10-CM | POA: Diagnosis not present

## 2021-03-27 DIAGNOSIS — R0602 Shortness of breath: Secondary | ICD-10-CM

## 2021-03-27 DIAGNOSIS — D49511 Neoplasm of unspecified behavior of right kidney: Secondary | ICD-10-CM | POA: Diagnosis not present

## 2021-03-27 DIAGNOSIS — I10 Essential (primary) hypertension: Secondary | ICD-10-CM | POA: Diagnosis not present

## 2021-03-27 DIAGNOSIS — Z23 Encounter for immunization: Secondary | ICD-10-CM

## 2021-03-27 DIAGNOSIS — I2699 Other pulmonary embolism without acute cor pulmonale: Secondary | ICD-10-CM | POA: Diagnosis not present

## 2021-03-27 DIAGNOSIS — I82220 Acute embolism and thrombosis of inferior vena cava: Secondary | ICD-10-CM | POA: Diagnosis not present

## 2021-03-27 DIAGNOSIS — R918 Other nonspecific abnormal finding of lung field: Secondary | ICD-10-CM | POA: Diagnosis not present

## 2021-03-27 DIAGNOSIS — R69 Illness, unspecified: Secondary | ICD-10-CM | POA: Diagnosis not present

## 2021-03-27 DIAGNOSIS — I823 Embolism and thrombosis of renal vein: Secondary | ICD-10-CM | POA: Diagnosis not present

## 2021-03-27 DIAGNOSIS — R600 Localized edema: Secondary | ICD-10-CM

## 2021-03-27 DIAGNOSIS — R5383 Other fatigue: Secondary | ICD-10-CM | POA: Diagnosis not present

## 2021-03-27 DIAGNOSIS — R0601 Orthopnea: Secondary | ICD-10-CM

## 2021-03-27 DIAGNOSIS — Z7982 Long term (current) use of aspirin: Secondary | ICD-10-CM | POA: Diagnosis not present

## 2021-03-27 DIAGNOSIS — R188 Other ascites: Secondary | ICD-10-CM

## 2021-03-27 DIAGNOSIS — Z79899 Other long term (current) drug therapy: Secondary | ICD-10-CM | POA: Diagnosis not present

## 2021-03-27 MED ORDER — NYSTATIN 100000 UNIT/ML MT SUSP: 5.0000 mL | Freq: Four times a day (QID) | OROMUCOSAL | 0 refills | Status: AC

## 2021-03-27 NOTE — Addendum Note (Signed)
Addended by: Carlene Coria on: 03/27/2021 10:34 AM   Modules accepted: Orders

## 2021-03-28 DIAGNOSIS — I2699 Other pulmonary embolism without acute cor pulmonale: Secondary | ICD-10-CM | POA: Diagnosis not present

## 2021-03-28 DIAGNOSIS — J9691 Respiratory failure, unspecified with hypoxia: Secondary | ICD-10-CM | POA: Diagnosis not present

## 2021-03-28 DIAGNOSIS — I10 Essential (primary) hypertension: Secondary | ICD-10-CM | POA: Diagnosis not present

## 2021-03-28 DIAGNOSIS — D49511 Neoplasm of unspecified behavior of right kidney: Secondary | ICD-10-CM | POA: Diagnosis not present

## 2021-03-28 DIAGNOSIS — R000 Tachycardia, unspecified: Secondary | ICD-10-CM | POA: Diagnosis not present

## 2021-03-28 DIAGNOSIS — R0602 Shortness of breath: Secondary | ICD-10-CM | POA: Diagnosis not present

## 2021-03-28 DIAGNOSIS — M069 Rheumatoid arthritis, unspecified: Secondary | ICD-10-CM | POA: Diagnosis not present

## 2021-03-28 DIAGNOSIS — J9601 Acute respiratory failure with hypoxia: Secondary | ICD-10-CM | POA: Diagnosis not present

## 2021-03-28 DIAGNOSIS — R600 Localized edema: Secondary | ICD-10-CM | POA: Diagnosis not present

## 2021-03-28 DIAGNOSIS — I82220 Acute embolism and thrombosis of inferior vena cava: Secondary | ICD-10-CM | POA: Diagnosis not present

## 2021-03-28 DIAGNOSIS — R0902 Hypoxemia: Secondary | ICD-10-CM | POA: Diagnosis not present

## 2021-03-28 DIAGNOSIS — J449 Chronic obstructive pulmonary disease, unspecified: Secondary | ICD-10-CM | POA: Diagnosis not present

## 2021-03-28 DIAGNOSIS — R748 Abnormal levels of other serum enzymes: Secondary | ICD-10-CM | POA: Diagnosis not present

## 2021-03-28 DIAGNOSIS — R918 Other nonspecific abnormal finding of lung field: Secondary | ICD-10-CM | POA: Diagnosis not present

## 2021-03-29 DIAGNOSIS — R0902 Hypoxemia: Secondary | ICD-10-CM | POA: Diagnosis not present

## 2021-03-29 DIAGNOSIS — J441 Chronic obstructive pulmonary disease with (acute) exacerbation: Secondary | ICD-10-CM | POA: Diagnosis not present

## 2021-03-29 DIAGNOSIS — D49511 Neoplasm of unspecified behavior of right kidney: Secondary | ICD-10-CM | POA: Diagnosis not present

## 2021-03-29 DIAGNOSIS — M069 Rheumatoid arthritis, unspecified: Secondary | ICD-10-CM | POA: Diagnosis not present

## 2021-03-29 DIAGNOSIS — J9601 Acute respiratory failure with hypoxia: Secondary | ICD-10-CM | POA: Diagnosis not present

## 2021-03-29 DIAGNOSIS — I82220 Acute embolism and thrombosis of inferior vena cava: Secondary | ICD-10-CM | POA: Diagnosis not present

## 2021-03-29 DIAGNOSIS — I2699 Other pulmonary embolism without acute cor pulmonale: Secondary | ICD-10-CM | POA: Diagnosis not present

## 2021-03-30 DIAGNOSIS — I2699 Other pulmonary embolism without acute cor pulmonale: Secondary | ICD-10-CM | POA: Diagnosis not present

## 2021-03-30 DIAGNOSIS — G4733 Obstructive sleep apnea (adult) (pediatric): Secondary | ICD-10-CM | POA: Diagnosis not present

## 2021-03-30 DIAGNOSIS — D72829 Elevated white blood cell count, unspecified: Secondary | ICD-10-CM | POA: Diagnosis not present

## 2021-03-30 DIAGNOSIS — Z8639 Personal history of other endocrine, nutritional and metabolic disease: Secondary | ICD-10-CM | POA: Diagnosis not present

## 2021-03-30 DIAGNOSIS — I82220 Acute embolism and thrombosis of inferior vena cava: Secondary | ICD-10-CM | POA: Diagnosis not present

## 2021-03-30 DIAGNOSIS — D690 Allergic purpura: Secondary | ICD-10-CM | POA: Diagnosis not present

## 2021-03-30 DIAGNOSIS — D49511 Neoplasm of unspecified behavior of right kidney: Secondary | ICD-10-CM | POA: Diagnosis not present

## 2021-03-30 DIAGNOSIS — I5189 Other ill-defined heart diseases: Secondary | ICD-10-CM | POA: Diagnosis not present

## 2021-03-30 DIAGNOSIS — M310 Hypersensitivity angiitis: Secondary | ICD-10-CM | POA: Diagnosis not present

## 2021-03-30 DIAGNOSIS — Z7901 Long term (current) use of anticoagulants: Secondary | ICD-10-CM | POA: Diagnosis not present

## 2021-03-30 DIAGNOSIS — I10 Essential (primary) hypertension: Secondary | ICD-10-CM | POA: Diagnosis not present

## 2021-03-30 DIAGNOSIS — J449 Chronic obstructive pulmonary disease, unspecified: Secondary | ICD-10-CM | POA: Diagnosis not present

## 2021-03-30 DIAGNOSIS — N2889 Other specified disorders of kidney and ureter: Secondary | ICD-10-CM | POA: Diagnosis not present

## 2021-03-30 DIAGNOSIS — I252 Old myocardial infarction: Secondary | ICD-10-CM | POA: Diagnosis not present

## 2021-03-30 DIAGNOSIS — D509 Iron deficiency anemia, unspecified: Secondary | ICD-10-CM | POA: Diagnosis not present

## 2021-03-30 DIAGNOSIS — I2510 Atherosclerotic heart disease of native coronary artery without angina pectoris: Secondary | ICD-10-CM | POA: Diagnosis not present

## 2021-03-31 DIAGNOSIS — D49511 Neoplasm of unspecified behavior of right kidney: Secondary | ICD-10-CM | POA: Diagnosis not present

## 2021-03-31 DIAGNOSIS — G4733 Obstructive sleep apnea (adult) (pediatric): Secondary | ICD-10-CM | POA: Diagnosis not present

## 2021-03-31 DIAGNOSIS — Z8639 Personal history of other endocrine, nutritional and metabolic disease: Secondary | ICD-10-CM | POA: Diagnosis not present

## 2021-03-31 DIAGNOSIS — I5189 Other ill-defined heart diseases: Secondary | ICD-10-CM | POA: Diagnosis not present

## 2021-03-31 DIAGNOSIS — D690 Allergic purpura: Secondary | ICD-10-CM | POA: Diagnosis not present

## 2021-03-31 DIAGNOSIS — I10 Essential (primary) hypertension: Secondary | ICD-10-CM | POA: Diagnosis not present

## 2021-03-31 DIAGNOSIS — M310 Hypersensitivity angiitis: Secondary | ICD-10-CM | POA: Diagnosis not present

## 2021-03-31 DIAGNOSIS — J449 Chronic obstructive pulmonary disease, unspecified: Secondary | ICD-10-CM | POA: Diagnosis not present

## 2021-03-31 DIAGNOSIS — I2510 Atherosclerotic heart disease of native coronary artery without angina pectoris: Secondary | ICD-10-CM | POA: Diagnosis not present

## 2021-03-31 DIAGNOSIS — I2699 Other pulmonary embolism without acute cor pulmonale: Secondary | ICD-10-CM | POA: Diagnosis not present

## 2021-03-31 DIAGNOSIS — I82220 Acute embolism and thrombosis of inferior vena cava: Secondary | ICD-10-CM | POA: Diagnosis not present

## 2021-03-31 DIAGNOSIS — N2889 Other specified disorders of kidney and ureter: Secondary | ICD-10-CM | POA: Diagnosis not present

## 2021-03-31 DIAGNOSIS — I252 Old myocardial infarction: Secondary | ICD-10-CM | POA: Diagnosis not present

## 2021-04-01 DIAGNOSIS — D690 Allergic purpura: Secondary | ICD-10-CM | POA: Diagnosis not present

## 2021-04-01 DIAGNOSIS — R918 Other nonspecific abnormal finding of lung field: Secondary | ICD-10-CM | POA: Diagnosis not present

## 2021-04-01 DIAGNOSIS — J449 Chronic obstructive pulmonary disease, unspecified: Secondary | ICD-10-CM | POA: Diagnosis not present

## 2021-04-01 DIAGNOSIS — I10 Essential (primary) hypertension: Secondary | ICD-10-CM | POA: Diagnosis not present

## 2021-04-01 DIAGNOSIS — I82220 Acute embolism and thrombosis of inferior vena cava: Secondary | ICD-10-CM | POA: Diagnosis not present

## 2021-04-01 DIAGNOSIS — Z8639 Personal history of other endocrine, nutritional and metabolic disease: Secondary | ICD-10-CM | POA: Diagnosis not present

## 2021-04-01 DIAGNOSIS — I5189 Other ill-defined heart diseases: Secondary | ICD-10-CM | POA: Diagnosis not present

## 2021-04-01 DIAGNOSIS — D49511 Neoplasm of unspecified behavior of right kidney: Secondary | ICD-10-CM | POA: Diagnosis not present

## 2021-04-01 DIAGNOSIS — I2510 Atherosclerotic heart disease of native coronary artery without angina pectoris: Secondary | ICD-10-CM | POA: Diagnosis not present

## 2021-04-01 DIAGNOSIS — I2699 Other pulmonary embolism without acute cor pulmonale: Secondary | ICD-10-CM | POA: Diagnosis not present

## 2021-04-01 DIAGNOSIS — G4733 Obstructive sleep apnea (adult) (pediatric): Secondary | ICD-10-CM | POA: Diagnosis not present

## 2021-04-01 DIAGNOSIS — I252 Old myocardial infarction: Secondary | ICD-10-CM | POA: Diagnosis not present

## 2021-04-01 DIAGNOSIS — N2889 Other specified disorders of kidney and ureter: Secondary | ICD-10-CM | POA: Diagnosis not present

## 2021-04-02 DIAGNOSIS — I871 Compression of vein: Secondary | ICD-10-CM | POA: Diagnosis not present

## 2021-04-02 DIAGNOSIS — N2889 Other specified disorders of kidney and ureter: Secondary | ICD-10-CM | POA: Diagnosis not present

## 2021-04-02 DIAGNOSIS — R918 Other nonspecific abnormal finding of lung field: Secondary | ICD-10-CM | POA: Diagnosis not present

## 2021-04-03 DIAGNOSIS — I82220 Acute embolism and thrombosis of inferior vena cava: Secondary | ICD-10-CM | POA: Diagnosis not present

## 2021-04-03 DIAGNOSIS — I2510 Atherosclerotic heart disease of native coronary artery without angina pectoris: Secondary | ICD-10-CM | POA: Diagnosis not present

## 2021-04-03 DIAGNOSIS — D49511 Neoplasm of unspecified behavior of right kidney: Secondary | ICD-10-CM | POA: Diagnosis not present

## 2021-04-03 DIAGNOSIS — D690 Allergic purpura: Secondary | ICD-10-CM | POA: Diagnosis not present

## 2021-04-03 DIAGNOSIS — J449 Chronic obstructive pulmonary disease, unspecified: Secondary | ICD-10-CM | POA: Diagnosis not present

## 2021-04-03 DIAGNOSIS — I252 Old myocardial infarction: Secondary | ICD-10-CM | POA: Diagnosis not present

## 2021-04-03 DIAGNOSIS — I10 Essential (primary) hypertension: Secondary | ICD-10-CM | POA: Diagnosis not present

## 2021-04-03 DIAGNOSIS — G4733 Obstructive sleep apnea (adult) (pediatric): Secondary | ICD-10-CM | POA: Diagnosis not present

## 2021-04-03 DIAGNOSIS — R918 Other nonspecific abnormal finding of lung field: Secondary | ICD-10-CM | POA: Diagnosis not present

## 2021-04-03 DIAGNOSIS — N2889 Other specified disorders of kidney and ureter: Secondary | ICD-10-CM | POA: Diagnosis not present

## 2021-04-03 DIAGNOSIS — I5189 Other ill-defined heart diseases: Secondary | ICD-10-CM | POA: Diagnosis not present

## 2021-04-03 DIAGNOSIS — Z8639 Personal history of other endocrine, nutritional and metabolic disease: Secondary | ICD-10-CM | POA: Diagnosis not present

## 2021-04-04 DIAGNOSIS — I2699 Other pulmonary embolism without acute cor pulmonale: Secondary | ICD-10-CM | POA: Diagnosis not present

## 2021-04-05 DIAGNOSIS — I2510 Atherosclerotic heart disease of native coronary artery without angina pectoris: Secondary | ICD-10-CM | POA: Diagnosis not present

## 2021-04-05 DIAGNOSIS — G4733 Obstructive sleep apnea (adult) (pediatric): Secondary | ICD-10-CM | POA: Diagnosis not present

## 2021-04-05 DIAGNOSIS — D49511 Neoplasm of unspecified behavior of right kidney: Secondary | ICD-10-CM | POA: Diagnosis not present

## 2021-04-05 DIAGNOSIS — I82220 Acute embolism and thrombosis of inferior vena cava: Secondary | ICD-10-CM | POA: Diagnosis not present

## 2021-04-05 DIAGNOSIS — I10 Essential (primary) hypertension: Secondary | ICD-10-CM | POA: Diagnosis not present

## 2021-04-05 DIAGNOSIS — J449 Chronic obstructive pulmonary disease, unspecified: Secondary | ICD-10-CM | POA: Diagnosis not present

## 2021-04-05 DIAGNOSIS — D690 Allergic purpura: Secondary | ICD-10-CM | POA: Diagnosis not present

## 2021-04-05 DIAGNOSIS — I252 Old myocardial infarction: Secondary | ICD-10-CM | POA: Diagnosis not present

## 2021-04-05 DIAGNOSIS — N2889 Other specified disorders of kidney and ureter: Secondary | ICD-10-CM | POA: Diagnosis not present

## 2021-04-05 DIAGNOSIS — I5189 Other ill-defined heart diseases: Secondary | ICD-10-CM | POA: Diagnosis not present

## 2021-04-05 DIAGNOSIS — Z8639 Personal history of other endocrine, nutritional and metabolic disease: Secondary | ICD-10-CM | POA: Diagnosis not present

## 2021-04-06 DIAGNOSIS — R93429 Abnormal radiologic findings on diagnostic imaging of unspecified kidney: Secondary | ICD-10-CM | POA: Diagnosis not present

## 2021-04-06 DIAGNOSIS — J449 Chronic obstructive pulmonary disease, unspecified: Secondary | ICD-10-CM | POA: Diagnosis not present

## 2021-04-06 DIAGNOSIS — R918 Other nonspecific abnormal finding of lung field: Secondary | ICD-10-CM | POA: Diagnosis not present

## 2021-04-06 DIAGNOSIS — I82220 Acute embolism and thrombosis of inferior vena cava: Secondary | ICD-10-CM | POA: Diagnosis not present

## 2021-04-06 DIAGNOSIS — I2699 Other pulmonary embolism without acute cor pulmonale: Secondary | ICD-10-CM | POA: Diagnosis not present

## 2021-04-06 DIAGNOSIS — J9601 Acute respiratory failure with hypoxia: Secondary | ICD-10-CM | POA: Diagnosis not present

## 2021-04-07 DIAGNOSIS — J449 Chronic obstructive pulmonary disease, unspecified: Secondary | ICD-10-CM | POA: Diagnosis not present

## 2021-04-07 DIAGNOSIS — R8289 Other abnormal findings on cytological and histological examination of urine: Secondary | ICD-10-CM | POA: Diagnosis not present

## 2021-04-07 DIAGNOSIS — I82220 Acute embolism and thrombosis of inferior vena cava: Secondary | ICD-10-CM | POA: Diagnosis not present

## 2021-04-07 DIAGNOSIS — J9601 Acute respiratory failure with hypoxia: Secondary | ICD-10-CM | POA: Diagnosis not present

## 2021-04-07 DIAGNOSIS — R601 Generalized edema: Secondary | ICD-10-CM | POA: Diagnosis not present

## 2021-04-07 DIAGNOSIS — R93429 Abnormal radiologic findings on diagnostic imaging of unspecified kidney: Secondary | ICD-10-CM | POA: Diagnosis not present

## 2021-04-07 DIAGNOSIS — R809 Proteinuria, unspecified: Secondary | ICD-10-CM | POA: Diagnosis not present

## 2021-04-07 DIAGNOSIS — R319 Hematuria, unspecified: Secondary | ICD-10-CM | POA: Diagnosis not present

## 2021-04-07 DIAGNOSIS — L959 Vasculitis limited to the skin, unspecified: Secondary | ICD-10-CM | POA: Diagnosis not present

## 2021-04-08 DIAGNOSIS — I2699 Other pulmonary embolism without acute cor pulmonale: Secondary | ICD-10-CM | POA: Diagnosis not present

## 2021-04-08 DIAGNOSIS — I7782 Antineutrophilic cytoplasmic antibody (ANCA) vasculitis: Secondary | ICD-10-CM | POA: Diagnosis not present

## 2021-04-08 DIAGNOSIS — I82220 Acute embolism and thrombosis of inferior vena cava: Secondary | ICD-10-CM | POA: Diagnosis not present

## 2021-04-08 DIAGNOSIS — R042 Hemoptysis: Secondary | ICD-10-CM | POA: Diagnosis not present

## 2021-04-08 DIAGNOSIS — Z135 Encounter for screening for eye and ear disorders: Secondary | ICD-10-CM | POA: Diagnosis not present

## 2021-04-08 DIAGNOSIS — D49511 Neoplasm of unspecified behavior of right kidney: Secondary | ICD-10-CM | POA: Diagnosis not present

## 2021-04-08 DIAGNOSIS — D690 Allergic purpura: Secondary | ICD-10-CM | POA: Diagnosis not present

## 2021-04-08 DIAGNOSIS — R918 Other nonspecific abnormal finding of lung field: Secondary | ICD-10-CM | POA: Diagnosis not present

## 2021-04-08 DIAGNOSIS — M310 Hypersensitivity angiitis: Secondary | ICD-10-CM | POA: Diagnosis not present

## 2021-04-08 DIAGNOSIS — R0600 Dyspnea, unspecified: Secondary | ICD-10-CM | POA: Diagnosis not present

## 2021-04-08 DIAGNOSIS — C801 Malignant (primary) neoplasm, unspecified: Secondary | ICD-10-CM | POA: Diagnosis not present

## 2021-04-08 DIAGNOSIS — R601 Generalized edema: Secondary | ICD-10-CM | POA: Diagnosis not present

## 2021-04-08 DIAGNOSIS — R8289 Other abnormal findings on cytological and histological examination of urine: Secondary | ICD-10-CM | POA: Diagnosis not present

## 2021-04-09 DIAGNOSIS — R918 Other nonspecific abnormal finding of lung field: Secondary | ICD-10-CM | POA: Diagnosis not present

## 2021-04-09 DIAGNOSIS — D49511 Neoplasm of unspecified behavior of right kidney: Secondary | ICD-10-CM | POA: Diagnosis not present

## 2021-04-09 DIAGNOSIS — R601 Generalized edema: Secondary | ICD-10-CM | POA: Diagnosis not present

## 2021-04-09 DIAGNOSIS — R809 Proteinuria, unspecified: Secondary | ICD-10-CM | POA: Diagnosis not present

## 2021-04-09 DIAGNOSIS — R319 Hematuria, unspecified: Secondary | ICD-10-CM | POA: Diagnosis not present

## 2021-04-09 DIAGNOSIS — I2699 Other pulmonary embolism without acute cor pulmonale: Secondary | ICD-10-CM | POA: Diagnosis not present

## 2021-04-09 DIAGNOSIS — J939 Pneumothorax, unspecified: Secondary | ICD-10-CM | POA: Diagnosis not present

## 2021-04-09 DIAGNOSIS — D690 Allergic purpura: Secondary | ICD-10-CM | POA: Diagnosis not present

## 2021-04-09 DIAGNOSIS — I82220 Acute embolism and thrombosis of inferior vena cava: Secondary | ICD-10-CM | POA: Diagnosis not present

## 2021-04-09 DIAGNOSIS — L958 Other vasculitis limited to the skin: Secondary | ICD-10-CM | POA: Diagnosis not present

## 2021-04-10 DIAGNOSIS — R809 Proteinuria, unspecified: Secondary | ICD-10-CM | POA: Diagnosis not present

## 2021-04-10 DIAGNOSIS — N179 Acute kidney failure, unspecified: Secondary | ICD-10-CM | POA: Diagnosis not present

## 2021-04-10 DIAGNOSIS — D509 Iron deficiency anemia, unspecified: Secondary | ICD-10-CM | POA: Diagnosis not present

## 2021-04-10 DIAGNOSIS — R319 Hematuria, unspecified: Secondary | ICD-10-CM | POA: Diagnosis not present

## 2021-04-10 DIAGNOSIS — I82220 Acute embolism and thrombosis of inferior vena cava: Secondary | ICD-10-CM | POA: Diagnosis not present

## 2021-04-10 DIAGNOSIS — I5020 Unspecified systolic (congestive) heart failure: Secondary | ICD-10-CM | POA: Diagnosis not present

## 2021-04-10 DIAGNOSIS — J90 Pleural effusion, not elsewhere classified: Secondary | ICD-10-CM | POA: Diagnosis not present

## 2021-04-10 DIAGNOSIS — R918 Other nonspecific abnormal finding of lung field: Secondary | ICD-10-CM | POA: Diagnosis not present

## 2021-04-10 DIAGNOSIS — J189 Pneumonia, unspecified organism: Secondary | ICD-10-CM | POA: Diagnosis not present

## 2021-04-10 DIAGNOSIS — L959 Vasculitis limited to the skin, unspecified: Secondary | ICD-10-CM | POA: Diagnosis not present

## 2021-04-10 DIAGNOSIS — J9601 Acute respiratory failure with hypoxia: Secondary | ICD-10-CM | POA: Diagnosis not present

## 2021-04-10 DIAGNOSIS — B370 Candidal stomatitis: Secondary | ICD-10-CM | POA: Diagnosis not present

## 2021-04-10 DIAGNOSIS — I2699 Other pulmonary embolism without acute cor pulmonale: Secondary | ICD-10-CM | POA: Diagnosis not present

## 2021-04-10 DIAGNOSIS — D49511 Neoplasm of unspecified behavior of right kidney: Secondary | ICD-10-CM | POA: Diagnosis not present

## 2021-04-10 DIAGNOSIS — R601 Generalized edema: Secondary | ICD-10-CM | POA: Diagnosis not present

## 2021-04-10 DIAGNOSIS — I110 Hypertensive heart disease with heart failure: Secondary | ICD-10-CM | POA: Diagnosis not present

## 2021-04-10 DIAGNOSIS — J449 Chronic obstructive pulmonary disease, unspecified: Secondary | ICD-10-CM | POA: Diagnosis not present

## 2021-04-10 DIAGNOSIS — D690 Allergic purpura: Secondary | ICD-10-CM | POA: Diagnosis not present

## 2021-04-11 DIAGNOSIS — I82220 Acute embolism and thrombosis of inferior vena cava: Secondary | ICD-10-CM | POA: Diagnosis not present

## 2021-04-11 DIAGNOSIS — J90 Pleural effusion, not elsewhere classified: Secondary | ICD-10-CM | POA: Diagnosis not present

## 2021-04-11 DIAGNOSIS — R918 Other nonspecific abnormal finding of lung field: Secondary | ICD-10-CM | POA: Diagnosis not present

## 2021-04-11 DIAGNOSIS — B379 Candidiasis, unspecified: Secondary | ICD-10-CM | POA: Diagnosis not present

## 2021-04-11 DIAGNOSIS — N179 Acute kidney failure, unspecified: Secondary | ICD-10-CM | POA: Diagnosis not present

## 2021-04-11 DIAGNOSIS — M310 Hypersensitivity angiitis: Secondary | ICD-10-CM | POA: Diagnosis not present

## 2021-04-11 DIAGNOSIS — J189 Pneumonia, unspecified organism: Secondary | ICD-10-CM | POA: Diagnosis not present

## 2021-04-11 DIAGNOSIS — H5712 Ocular pain, left eye: Secondary | ICD-10-CM | POA: Diagnosis not present

## 2021-04-11 DIAGNOSIS — R319 Hematuria, unspecified: Secondary | ICD-10-CM | POA: Diagnosis not present

## 2021-04-11 DIAGNOSIS — D49511 Neoplasm of unspecified behavior of right kidney: Secondary | ICD-10-CM | POA: Diagnosis not present

## 2021-04-11 DIAGNOSIS — J9601 Acute respiratory failure with hypoxia: Secondary | ICD-10-CM | POA: Diagnosis not present

## 2021-04-11 DIAGNOSIS — R809 Proteinuria, unspecified: Secondary | ICD-10-CM | POA: Diagnosis not present

## 2021-04-11 DIAGNOSIS — L959 Vasculitis limited to the skin, unspecified: Secondary | ICD-10-CM | POA: Diagnosis not present

## 2021-04-11 DIAGNOSIS — J449 Chronic obstructive pulmonary disease, unspecified: Secondary | ICD-10-CM | POA: Diagnosis not present

## 2021-04-11 DIAGNOSIS — I2699 Other pulmonary embolism without acute cor pulmonale: Secondary | ICD-10-CM | POA: Diagnosis not present

## 2021-04-12 DIAGNOSIS — G4733 Obstructive sleep apnea (adult) (pediatric): Secondary | ICD-10-CM | POA: Diagnosis not present

## 2021-04-12 DIAGNOSIS — D49511 Neoplasm of unspecified behavior of right kidney: Secondary | ICD-10-CM | POA: Diagnosis not present

## 2021-04-12 DIAGNOSIS — N2889 Other specified disorders of kidney and ureter: Secondary | ICD-10-CM | POA: Diagnosis not present

## 2021-04-12 DIAGNOSIS — C801 Malignant (primary) neoplasm, unspecified: Secondary | ICD-10-CM | POA: Diagnosis not present

## 2021-04-12 DIAGNOSIS — I110 Hypertensive heart disease with heart failure: Secondary | ICD-10-CM | POA: Diagnosis not present

## 2021-04-12 DIAGNOSIS — I5020 Unspecified systolic (congestive) heart failure: Secondary | ICD-10-CM | POA: Diagnosis not present

## 2021-04-12 DIAGNOSIS — R911 Solitary pulmonary nodule: Secondary | ICD-10-CM | POA: Diagnosis not present

## 2021-04-12 DIAGNOSIS — I2699 Other pulmonary embolism without acute cor pulmonale: Secondary | ICD-10-CM | POA: Diagnosis not present

## 2021-04-12 DIAGNOSIS — J449 Chronic obstructive pulmonary disease, unspecified: Secondary | ICD-10-CM | POA: Diagnosis not present

## 2021-04-12 DIAGNOSIS — N179 Acute kidney failure, unspecified: Secondary | ICD-10-CM | POA: Diagnosis not present

## 2021-04-12 DIAGNOSIS — E785 Hyperlipidemia, unspecified: Secondary | ICD-10-CM | POA: Diagnosis not present

## 2021-04-12 DIAGNOSIS — D509 Iron deficiency anemia, unspecified: Secondary | ICD-10-CM | POA: Diagnosis not present

## 2021-04-12 DIAGNOSIS — J9601 Acute respiratory failure with hypoxia: Secondary | ICD-10-CM | POA: Diagnosis not present

## 2021-04-12 DIAGNOSIS — M069 Rheumatoid arthritis, unspecified: Secondary | ICD-10-CM | POA: Diagnosis not present

## 2021-04-13 DIAGNOSIS — I2699 Other pulmonary embolism without acute cor pulmonale: Secondary | ICD-10-CM | POA: Diagnosis not present

## 2021-04-13 DIAGNOSIS — N2889 Other specified disorders of kidney and ureter: Secondary | ICD-10-CM | POA: Diagnosis not present

## 2021-04-13 DIAGNOSIS — N179 Acute kidney failure, unspecified: Secondary | ICD-10-CM | POA: Diagnosis not present

## 2021-04-13 DIAGNOSIS — Z720 Tobacco use: Secondary | ICD-10-CM | POA: Diagnosis not present

## 2021-04-13 DIAGNOSIS — R69 Illness, unspecified: Secondary | ICD-10-CM | POA: Diagnosis not present

## 2021-04-13 DIAGNOSIS — R918 Other nonspecific abnormal finding of lung field: Secondary | ICD-10-CM | POA: Diagnosis not present

## 2021-04-13 DIAGNOSIS — M069 Rheumatoid arthritis, unspecified: Secondary | ICD-10-CM | POA: Diagnosis not present

## 2021-04-13 DIAGNOSIS — D509 Iron deficiency anemia, unspecified: Secondary | ICD-10-CM | POA: Diagnosis not present

## 2021-04-13 DIAGNOSIS — I5020 Unspecified systolic (congestive) heart failure: Secondary | ICD-10-CM | POA: Diagnosis not present

## 2021-04-13 DIAGNOSIS — B379 Candidiasis, unspecified: Secondary | ICD-10-CM | POA: Diagnosis not present

## 2021-04-13 DIAGNOSIS — F410 Panic disorder [episodic paroxysmal anxiety] without agoraphobia: Secondary | ICD-10-CM | POA: Diagnosis not present

## 2021-04-13 DIAGNOSIS — J449 Chronic obstructive pulmonary disease, unspecified: Secondary | ICD-10-CM | POA: Diagnosis not present

## 2021-04-13 DIAGNOSIS — R809 Proteinuria, unspecified: Secondary | ICD-10-CM | POA: Diagnosis not present

## 2021-04-13 DIAGNOSIS — R451 Restlessness and agitation: Secondary | ICD-10-CM | POA: Diagnosis not present

## 2021-04-13 DIAGNOSIS — D49511 Neoplasm of unspecified behavior of right kidney: Secondary | ICD-10-CM | POA: Diagnosis not present

## 2021-04-13 DIAGNOSIS — J9601 Acute respiratory failure with hypoxia: Secondary | ICD-10-CM | POA: Diagnosis not present

## 2021-04-13 DIAGNOSIS — I110 Hypertensive heart disease with heart failure: Secondary | ICD-10-CM | POA: Diagnosis not present

## 2021-04-13 DIAGNOSIS — R601 Generalized edema: Secondary | ICD-10-CM | POA: Diagnosis not present

## 2021-04-13 DIAGNOSIS — G479 Sleep disorder, unspecified: Secondary | ICD-10-CM | POA: Diagnosis not present

## 2021-04-13 DIAGNOSIS — E785 Hyperlipidemia, unspecified: Secondary | ICD-10-CM | POA: Diagnosis not present

## 2021-05-16 DEATH — deceased

## 2021-08-07 ENCOUNTER — Other Ambulatory Visit: Payer: Self-pay

## 2021-08-07 DIAGNOSIS — N401 Enlarged prostate with lower urinary tract symptoms: Secondary | ICD-10-CM

## 2021-08-10 ENCOUNTER — Other Ambulatory Visit: Payer: Self-pay

## 2021-08-10 ENCOUNTER — Encounter: Payer: Self-pay | Admitting: Urology

## 2021-08-14 ENCOUNTER — Ambulatory Visit: Payer: Self-pay | Admitting: Urology
